# Patient Record
Sex: Female | Born: 1980
Health system: Southern US, Community
[De-identification: ages and names within clinical notes are randomized; demographics above are authoritative.]

## PROBLEM LIST (undated history)

## (undated) DIAGNOSIS — E538 Deficiency of other specified B group vitamins: Secondary | ICD-10-CM

## (undated) DIAGNOSIS — I10 Essential (primary) hypertension: Secondary | ICD-10-CM

## (undated) DIAGNOSIS — E559 Vitamin D deficiency, unspecified: Secondary | ICD-10-CM

## (undated) DIAGNOSIS — J45909 Unspecified asthma, uncomplicated: Secondary | ICD-10-CM

## (undated) DIAGNOSIS — E282 Polycystic ovarian syndrome: Secondary | ICD-10-CM

## (undated) DIAGNOSIS — Z6841 Body Mass Index (BMI) 40.0 and over, adult: Secondary | ICD-10-CM

## (undated) DIAGNOSIS — O99840 Bariatric surgery status complicating pregnancy, unspecified trimester: Secondary | ICD-10-CM

## (undated) DIAGNOSIS — D509 Iron deficiency anemia, unspecified: Secondary | ICD-10-CM

## (undated) HISTORY — DX: Iron deficiency anemia, unspecified: D50.9

## (undated) HISTORY — DX: Morbid (severe) obesity due to excess calories: E66.01

## (undated) HISTORY — PX: INTRAUTERINE DEVICE INSERTION: SHX323

## (undated) HISTORY — DX: Deficiency of other specified B group vitamins: E53.8

## (undated) HISTORY — DX: Vitamin D deficiency, unspecified: E55.9

## (undated) HISTORY — DX: Body Mass Index (BMI) 40.0 and over, adult: Z684

## (undated) HISTORY — DX: Polycystic ovarian syndrome: E28.2

## (undated) HISTORY — DX: Unspecified asthma, uncomplicated: J45.909

## (undated) HISTORY — PX: WISDOM TOOTH EXTRACTION: SHX21

## (undated) HISTORY — DX: Bariatric surgery status complicating pregnancy, unspecified trimester: O99.840

## (undated) HISTORY — DX: Essential (primary) hypertension: I10

---
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
## 1999-09-02 ENCOUNTER — Encounter: Admission: RE | Admit: 1999-09-02 | Discharge: 1999-09-02 | Payer: Self-pay | Admitting: Infectious Diseases

## 2003-09-30 HISTORY — PX: APPENDECTOMY: SHX54

## 2005-10-24 ENCOUNTER — Ambulatory Visit: Payer: Self-pay | Admitting: Internal Medicine

## 2006-09-06 ENCOUNTER — Ambulatory Visit: Payer: Self-pay | Admitting: Otolaryngology

## 2006-09-29 DIAGNOSIS — O99840 Bariatric surgery status complicating pregnancy, unspecified trimester: Secondary | ICD-10-CM

## 2006-09-29 HISTORY — DX: Bariatric surgery status complicating pregnancy, unspecified trimester: O99.840

## 2006-09-29 HISTORY — PX: GASTRIC BYPASS: SHX52

## 2006-10-04 ENCOUNTER — Ambulatory Visit: Payer: Self-pay | Admitting: Otolaryngology

## 2007-02-27 ENCOUNTER — Emergency Department (HOSPITAL_COMMUNITY): Admission: EM | Admit: 2007-02-27 | Discharge: 2007-02-27 | Payer: Self-pay | Admitting: Emergency Medicine

## 2007-05-08 ENCOUNTER — Ambulatory Visit: Payer: Self-pay | Admitting: Internal Medicine

## 2007-07-16 ENCOUNTER — Emergency Department: Payer: Self-pay | Admitting: Emergency Medicine

## 2007-09-18 ENCOUNTER — Ambulatory Visit: Payer: Self-pay

## 2007-09-20 ENCOUNTER — Ambulatory Visit: Payer: Self-pay

## 2007-09-22 ENCOUNTER — Ambulatory Visit: Payer: Self-pay

## 2008-02-13 ENCOUNTER — Inpatient Hospital Stay: Payer: Self-pay | Admitting: Specialist

## 2008-09-29 HISTORY — PX: OTHER SURGICAL HISTORY: SHX169

## 2009-04-16 ENCOUNTER — Ambulatory Visit (HOSPITAL_COMMUNITY): Admission: RE | Admit: 2009-04-16 | Discharge: 2009-04-16 | Payer: Self-pay | Admitting: Obstetrics

## 2009-05-25 ENCOUNTER — Inpatient Hospital Stay (HOSPITAL_COMMUNITY): Admission: AD | Admit: 2009-05-25 | Discharge: 2009-05-25 | Payer: Self-pay | Admitting: Obstetrics and Gynecology

## 2009-07-18 ENCOUNTER — Encounter: Admission: RE | Admit: 2009-07-18 | Discharge: 2009-09-26 | Payer: Self-pay | Admitting: Obstetrics

## 2010-01-07 ENCOUNTER — Inpatient Hospital Stay (HOSPITAL_COMMUNITY): Admission: AD | Admit: 2010-01-07 | Discharge: 2010-01-11 | Payer: Self-pay | Admitting: Obstetrics

## 2010-01-14 ENCOUNTER — Ambulatory Visit: Payer: Self-pay | Admitting: Pediatrics

## 2010-12-17 LAB — RH IMMUNE GLOB WKUP(>/=20WKS)(NOT WOMEN'S HOSP)

## 2010-12-18 LAB — CBC
HCT: 31.7 % — ABNORMAL LOW (ref 36.0–46.0)
HCT: 31.8 % — ABNORMAL LOW (ref 36.0–46.0)
Hemoglobin: 10.4 g/dL — ABNORMAL LOW (ref 12.0–15.0)
MCHC: 32.9 g/dL (ref 30.0–36.0)
MCHC: 33.9 g/dL (ref 30.0–36.0)
MCV: 84.6 fL (ref 78.0–100.0)
MCV: 84.7 fL (ref 78.0–100.0)
MCV: 84.8 fL (ref 78.0–100.0)
MCV: 84.8 fL (ref 78.0–100.0)
Platelets: 198 10*3/uL (ref 150–400)
Platelets: 231 10*3/uL (ref 150–400)
Platelets: 270 10*3/uL (ref 150–400)
RBC: 3.32 MIL/uL — ABNORMAL LOW (ref 3.87–5.11)
RDW: 13.2 % (ref 11.5–15.5)
RDW: 13.3 % (ref 11.5–15.5)
RDW: 13.6 % (ref 11.5–15.5)
WBC: 12.3 10*3/uL — ABNORMAL HIGH (ref 4.0–10.5)

## 2010-12-18 LAB — GLUCOSE, CAPILLARY
Glucose-Capillary: 79 mg/dL (ref 70–99)
Glucose-Capillary: 86 mg/dL (ref 70–99)
Glucose-Capillary: 91 mg/dL (ref 70–99)

## 2010-12-18 LAB — COMPREHENSIVE METABOLIC PANEL
ALT: 14 U/L (ref 0–35)
AST: 19 U/L (ref 0–37)
Albumin: 3.2 g/dL — ABNORMAL LOW (ref 3.5–5.2)
Calcium: 8.9 mg/dL (ref 8.4–10.5)
Chloride: 106 mEq/L (ref 96–112)
Creatinine, Ser: 0.53 mg/dL (ref 0.4–1.2)
GFR calc Af Amer: >60 mL/min (ref >60)
Sodium: 136 mEq/L (ref 135–145)
Total Bilirubin: 0.3 mg/dL (ref 0.3–1.2)

## 2010-12-18 LAB — TYPE AND SCREEN
ABO/RH(D): A NEG
Antibody Screen: NEGATIVE

## 2011-01-04 LAB — RH IMMUNE GLOBULIN WORKUP (NOT WOMEN'S HOSP): ABO/RH(D): A NEG

## 2011-02-26 LAB — GC/CHLAMYDIA PROBE AMP, GENITAL
Chlamydia: NEGATIVE
Gonorrhea: NEGATIVE

## 2011-03-07 ENCOUNTER — Ambulatory Visit: Payer: Self-pay | Admitting: Family Medicine

## 2011-05-11 LAB — ANTIBODY SCREEN: Antibody Screen: NEGATIVE

## 2011-05-11 LAB — HEPATITIS B SURFACE ANTIGEN: Hepatitis B Surface Ag: NEGATIVE

## 2011-05-11 LAB — RPR: RPR: NONREACTIVE

## 2011-05-11 LAB — RUBELLA ANTIBODY, IGM: Rubella: IMMUNE

## 2011-09-30 NOTE — L&D Delivery Note (Signed)
Delivery Note At 10:58 PM a viable female was delivered via Vaginal, Spontaneous Delivery (Presentation: Right Occiput Anterior).  APGAR: 10, 10; weight .  8'1 Placenta status: Intact, Spontaneous.  Cord: 3 vessels with the following complications: None.    Anesthesia: Epidural  Episiotomy: None Lacerations: 2nd degree perineal w/ extension to L labia majora Suture Repair: 2.0 3.0 vicryl rapide, 2-0 for repair of laceration, 3-0 to reapproximate subcuticular layer of labial extension Est. Blood Loss (mL): 500cc  Mom to postpartum.  Baby to nursery-stable.  Meredith Castillo A. 11/18/2011, 11:29 PM

## 2011-10-13 ENCOUNTER — Inpatient Hospital Stay (HOSPITAL_COMMUNITY)
Admission: AD | Admit: 2011-10-13 | Discharge: 2011-10-13 | Disposition: A | Discharge status: Home / Self Care | Payer: BC Managed Care – PPO | Source: Ambulatory Visit | Attending: Obstetrics and Gynecology | Admitting: Obstetrics and Gynecology

## 2011-10-13 ENCOUNTER — Encounter (HOSPITAL_COMMUNITY): Payer: Self-pay

## 2011-10-13 DIAGNOSIS — O99019 Anemia complicating pregnancy, unspecified trimester: Secondary | ICD-10-CM | POA: Insufficient documentation

## 2011-10-13 DIAGNOSIS — D509 Iron deficiency anemia, unspecified: Secondary | ICD-10-CM | POA: Insufficient documentation

## 2011-10-13 MED ORDER — SODIUM CHLORIDE 0.9 % IV SOLN
Freq: Once | INTRAVENOUS | Status: AC
  Administered 2011-10-13: 16:00:00 via INTRAVENOUS

## 2011-10-13 MED ORDER — FERUMOXYTOL INJECTION 510 MG/17 ML
510.0000 mg | Freq: Once | INTRAVENOUS | Status: AC
  Administered 2011-10-13: 510 mg via INTRAVENOUS
  Filled 2011-10-13: qty 17

## 2011-10-13 NOTE — Progress Notes (Signed)
Pt here for iv iron infusion only, states hbg level 8.4, ferritin 3. Denies pain or bleeding. +FM.

## 2011-10-16 ENCOUNTER — Other Ambulatory Visit: Payer: Self-pay | Admitting: Obstetrics

## 2011-10-16 MED ORDER — FERUMOXYTOL INJECTION 510 MG/17 ML: 510.0000 mg | Freq: Once | INTRAVENOUS | Status: DC

## 2011-10-16 MED ORDER — LACTATED RINGERS IV SOLN: 1000.0000 mL | INTRAVENOUS | Status: DC

## 2011-10-17 ENCOUNTER — Inpatient Hospital Stay (HOSPITAL_COMMUNITY)
Admission: AD | Admit: 2011-10-17 | Discharge: 2011-10-17 | Disposition: A | Discharge status: Home / Self Care | Payer: BC Managed Care – PPO | Source: Ambulatory Visit | Attending: Obstetrics & Gynecology | Admitting: Obstetrics & Gynecology

## 2011-10-17 ENCOUNTER — Encounter (HOSPITAL_COMMUNITY): Payer: Self-pay | Admitting: *Deleted

## 2011-10-17 DIAGNOSIS — O99840 Bariatric surgery status complicating pregnancy, unspecified trimester: Secondary | ICD-10-CM | POA: Insufficient documentation

## 2011-10-17 DIAGNOSIS — O99019 Anemia complicating pregnancy, unspecified trimester: Secondary | ICD-10-CM | POA: Insufficient documentation

## 2011-10-17 DIAGNOSIS — D649 Anemia, unspecified: Secondary | ICD-10-CM | POA: Insufficient documentation

## 2011-10-17 MED ORDER — SODIUM CHLORIDE 0.9 % IV SOLN
INTRAVENOUS | Status: DC
  Administered 2011-10-17: 13:00:00 via INTRAVENOUS

## 2011-10-17 MED ORDER — FERUMOXYTOL INJECTION 510 MG/17 ML
510.0000 mg | Freq: Once | INTRAVENOUS | Status: AC
  Administered 2011-10-17: 510 mg via INTRAVENOUS
  Filled 2011-10-17: qty 17

## 2011-10-22 LAB — STREP B DNA PROBE: GBS: NEGATIVE

## 2011-11-14 ENCOUNTER — Telehealth (HOSPITAL_COMMUNITY): Payer: Self-pay | Admitting: *Deleted

## 2011-11-14 ENCOUNTER — Encounter (HOSPITAL_COMMUNITY): Payer: Self-pay | Admitting: *Deleted

## 2011-11-14 NOTE — Telephone Encounter (Signed)
Preadmission screen  

## 2011-11-17 ENCOUNTER — Other Ambulatory Visit: Payer: Self-pay | Admitting: Obstetrics

## 2011-11-18 ENCOUNTER — Encounter (HOSPITAL_COMMUNITY): Payer: Self-pay | Admitting: Anesthesiology

## 2011-11-18 ENCOUNTER — Encounter (HOSPITAL_COMMUNITY): Payer: Self-pay

## 2011-11-18 ENCOUNTER — Inpatient Hospital Stay (HOSPITAL_COMMUNITY)
Admission: RE | Admit: 2011-11-18 | Discharge: 2011-11-20 | DRG: 372 | Disposition: A | Discharge status: Home Health | Payer: BC Managed Care – PPO | Source: Ambulatory Visit | Attending: Obstetrics | Admitting: Obstetrics

## 2011-11-18 ENCOUNTER — Inpatient Hospital Stay (HOSPITAL_COMMUNITY): Payer: BC Managed Care – PPO | Admitting: Anesthesiology

## 2011-11-18 DIAGNOSIS — O99844 Bariatric surgery status complicating childbirth: Secondary | ICD-10-CM | POA: Diagnosis present

## 2011-11-18 DIAGNOSIS — D509 Iron deficiency anemia, unspecified: Secondary | ICD-10-CM | POA: Diagnosis present

## 2011-11-18 DIAGNOSIS — O9902 Anemia complicating childbirth: Secondary | ICD-10-CM | POA: Diagnosis present

## 2011-11-18 DIAGNOSIS — D649 Anemia, unspecified: Secondary | ICD-10-CM | POA: Diagnosis present

## 2011-11-18 DIAGNOSIS — O99814 Abnormal glucose complicating childbirth: Secondary | ICD-10-CM | POA: Diagnosis present

## 2011-11-18 LAB — CBC
HCT: 34.1 % — ABNORMAL LOW (ref 36.0–46.0)
Hemoglobin: 10.5 g/dL — ABNORMAL LOW (ref 12.0–15.0)
Platelets: 202 10*3/uL (ref 150–400)
WBC: 9.2 10*3/uL (ref 4.0–10.5)

## 2011-11-18 LAB — TYPE AND SCREEN: Antibody Screen: POSITIVE

## 2011-11-18 LAB — GLUCOSE, CAPILLARY
Glucose-Capillary: 64 mg/dL — ABNORMAL LOW (ref 70–99)
Glucose-Capillary: 78 mg/dL (ref 70–99)

## 2011-11-18 LAB — GLUCOSE, RANDOM: Glucose, Bld: 76 mg/dL (ref 70–99)

## 2011-11-18 MED ORDER — DIPHENHYDRAMINE HCL 50 MG/ML IJ SOLN
12.5000 mg | INTRAMUSCULAR | Status: AC | PRN
  Administered 2011-11-18 (×3): 12.5 mg via INTRAVENOUS
  Filled 2011-11-18 (×2): qty 1

## 2011-11-18 MED ORDER — LACTATED RINGERS IV SOLN: 500.0000 mL | INTRAVENOUS | Status: DC | PRN

## 2011-11-18 MED ORDER — TERBUTALINE SULFATE 1 MG/ML IJ SOLN: 0.2500 mg | Freq: Once | INTRAMUSCULAR | Status: AC | PRN

## 2011-11-18 MED ORDER — ONDANSETRON HCL 4 MG/2ML IJ SOLN: 4.0000 mg | Freq: Four times a day (QID) | INTRAMUSCULAR | Status: DC | PRN

## 2011-11-18 MED ORDER — PHENYLEPHRINE 40 MCG/ML (10ML) SYRINGE FOR IV PUSH (FOR BLOOD PRESSURE SUPPORT): 80.0000 ug | PREFILLED_SYRINGE | INTRAVENOUS | Status: DC | PRN

## 2011-11-18 MED ORDER — FENTANYL 2.5 MCG/ML BUPIVACAINE 1/10 % EPIDURAL INFUSION (WH - ANES)
INTRAMUSCULAR | Status: DC | PRN
  Administered 2011-11-18: 14 mL/h via EPIDURAL

## 2011-11-18 MED ORDER — IBUPROFEN 600 MG PO TABS: 600.0000 mg | ORAL_TABLET | Freq: Four times a day (QID) | ORAL | Status: DC | PRN

## 2011-11-18 MED ORDER — CITRIC ACID-SODIUM CITRATE 334-500 MG/5ML PO SOLN: 30.0000 mL | ORAL | Status: DC | PRN

## 2011-11-18 MED ORDER — SODIUM BICARBONATE 8.4 % IV SOLN
INTRAVENOUS | Status: DC | PRN
  Administered 2011-11-18: 4 mL via EPIDURAL

## 2011-11-18 MED ORDER — LIDOCAINE HCL (PF) 1 % IJ SOLN
30.0000 mL | INTRAMUSCULAR | Status: DC | PRN
  Filled 2011-11-18: qty 30

## 2011-11-18 MED ORDER — PHENYLEPHRINE 40 MCG/ML (10ML) SYRINGE FOR IV PUSH (FOR BLOOD PRESSURE SUPPORT)
80.0000 ug | PREFILLED_SYRINGE | INTRAVENOUS | Status: DC | PRN
  Filled 2011-11-18: qty 5

## 2011-11-18 MED ORDER — FENTANYL 2.5 MCG/ML BUPIVACAINE 1/10 % EPIDURAL INFUSION (WH - ANES)
14.0000 mL/h | INTRAMUSCULAR | Status: DC
  Administered 2011-11-18 (×3): 14 mL/h via EPIDURAL
  Filled 2011-11-18 (×4): qty 60

## 2011-11-18 MED ORDER — LACTATED RINGERS IV SOLN
INTRAVENOUS | Status: DC
  Administered 2011-11-18 (×5): via INTRAVENOUS

## 2011-11-18 MED ORDER — OXYTOCIN BOLUS FROM INFUSION
500.0000 mL | Freq: Once | INTRAVENOUS | Status: DC
  Filled 2011-11-18: qty 500

## 2011-11-18 MED ORDER — LACTATED RINGERS IV SOLN: 500.0000 mL | Freq: Once | INTRAVENOUS | Status: DC

## 2011-11-18 MED ORDER — ACETAMINOPHEN 325 MG PO TABS: 650.0000 mg | ORAL_TABLET | ORAL | Status: DC | PRN

## 2011-11-18 MED ORDER — FLEET ENEMA 7-19 GM/118ML RE ENEM: 1.0000 | ENEMA | RECTAL | Status: DC | PRN

## 2011-11-18 MED ORDER — EPHEDRINE 5 MG/ML INJ: 10.0000 mg | INTRAVENOUS | Status: DC | PRN

## 2011-11-18 MED ORDER — ZOLPIDEM TARTRATE 10 MG PO TABS: 10.0000 mg | ORAL_TABLET | Freq: Every evening | ORAL | Status: DC | PRN

## 2011-11-18 MED ORDER — OXYTOCIN 20 UNITS IN LACTATED RINGERS INFUSION - SIMPLE
1.0000 m[IU]/min | INTRAVENOUS | Status: DC
  Administered 2011-11-18: 2 m[IU]/min via INTRAVENOUS
  Filled 2011-11-18 (×2): qty 1000

## 2011-11-18 MED ORDER — OXYTOCIN 20 UNITS IN LACTATED RINGERS INFUSION - SIMPLE
125.0000 mL/h | Freq: Once | INTRAVENOUS | Status: AC
  Administered 2011-11-18: 125 mL/h via INTRAVENOUS

## 2011-11-18 MED ORDER — OXYCODONE-ACETAMINOPHEN 5-325 MG PO TABS: 1.0000 | ORAL_TABLET | ORAL | Status: DC | PRN

## 2011-11-18 MED ORDER — EPHEDRINE 5 MG/ML INJ
10.0000 mg | INTRAVENOUS | Status: DC | PRN
  Filled 2011-11-18: qty 4

## 2011-11-18 MED ORDER — BUPIVACAINE HCL (PF) 0.25 % IJ SOLN
INTRAMUSCULAR | Status: DC | PRN
  Administered 2011-11-18 (×2): 5 mL

## 2011-11-18 NOTE — Progress Notes (Signed)
Dr. Ernestina Penna in, SVE, patient complete but in a lot of pain. OK to get anesthesia bolus and resolve pain, patient to labor down. Candise Che, RN

## 2011-11-18 NOTE — Progress Notes (Signed)
S: Doing well, no complaints, pain well controlled with epidural  O: BP 106/57  Pulse 70  Temp(Src) 98.5 F (36.9 C) (Oral)  Resp 20  Ht 5\' 8"  (1.727 m)  Wt 123.832 kg (273 lb)  BMI 41.51 kg/m2  SpO2 99%  LMP 02/07/2011   FHT:  FHR: 140 bpm, variability: moderate,  accelerations:  Present,  decelerations:  Absent UC:   regular, every 3 minutes, pit at 14 munits/ min, MVU 170-210 SVE:   Dilation: 4 Effacement (%): 80 Station: -1 Exam by:: Raliegh Ip RN Repeat exam now by me, no change over 4 hrs   A / P:  31 y.o.  Obstetric History   G2   P1   T1   P0   A0   TAB0   SAB0   E0   M0   L1    at [redacted]w[redacted]d Induction of labor due to elective and anemia,  progressing well on pitocin  Fetal Wellbeing:  Category I Pain Control:  Epidural  Anticipated MOD:  NSVD, concern given as EFW larger than prior pregnancy and slow change, unclear if pt entered active phase (uncomfortable to needing epidural) or still latent. Mom and baby well, cont to increase pitocin.   Deuce Paternoster A. 11/18/2011, 5:32 PM

## 2011-11-18 NOTE — Progress Notes (Signed)
S: Doing well, no complaints, pain well controlled with epidural  O: BP 118/72  Pulse 69  Temp(Src) 98.6 F (37 C) (Oral)  Resp 20  Ht 5\' 8"  (1.727 m)  Wt 123.832 kg (273 lb)  BMI 41.51 kg/m2  SpO2 99%  LMP 02/07/2011   FHT:  FHR: 120s bpm, variability: moderate,  accelerations:  Present,  decelerations:  Absent UC:   regular, every 4 minutes SVE:   Dilation: 2.5 Effacement (%): 80 Station: -1 Exam by:: Wiliam Ke CNM, repeated now by [facility], no signif change in cvx. IUPC placed   A / P:  31 y.o.  Obstetric History   G2   P1   T1   P0   A0   TAB0   SAB0   E0   M0   L1    at [redacted]w[redacted]d, elective IOL, anemia Induction of labor due to elective,  progressing well on pitocin, slow change, likely need to titrate pitocin, IUPC placed  Fetal Wellbeing:  Category I Pain Control:  Epidural  Anticipated MOD:  NSVD  Meredith Castillo A. 11/18/2011, 1:03 PM

## 2011-11-18 NOTE — Progress Notes (Signed)
Patient ID: Meredith Castillo, female   DOB: August 11, 1981, 31 y.o.   MRN: [id number]  Sent by MD to recheck cervix and augment labor with AROM  S: Feeling ctx / planning epidural     Tolerating contractions well - resting on side / family at bedside      States "hates cervix check" - " I always scream with any exam"                         agrees to recheck with AROM but prefers all other exams after epidural  O:  VS: Blood pressure 139/85, pulse 66, temperature 98.4 F (36.9 C), temperature source Oral, resp. rate 20, height 5\' 8"  (1.727 m), weight 123.832 kg (273 lb), last menstrual period 02/07/2011.        FHR : baseline 135 / variability moderate / accels + / decels none        Toco: contractions every 3-4 minutes / pitocin at 8 mu/min        Cervix : mid-position / 2cm / 80% / vtx / -1  (easy cervical exam - patient tense & tearful thru exam)        Membranes: AROM  A: induction of labor - iron deficiency anemia / elective     FHR category 1  P: continue pitocin induction of labor      Early epidural - patient intolerant of cervical examinations / prefers epidural before further exams      Dr Ernestina Penna updated - MD to manage in labor     Marjo Grosvenor 11/18/2011, 11:07 AM

## 2011-11-18 NOTE — Progress Notes (Signed)
S: Doing well, no complaints, pain has been controlled with epidural though starting to feel increased bladder pressure, felt catheter manipulation and feeling increasing discomfort with pelvic exam  O: BP 121/62  Pulse 68  Temp(Src) 98.8 F (37.1 C) (Oral)  Resp 18  Ht 5\' 8"  (1.727 m)  Wt 123.832 kg (273 lb)  BMI 41.51 kg/m2  SpO2 99%  LMP 02/07/2011   FHT:  FHR: 120s bpm, variability: moderate,  accelerations:  Present,  decelerations:  Absent UC:   regular, every 3 minutes, pit at 16 munits/ min. MVU around 200 SVE:   Dilation: 4 Effacement (%): 80 Station: -1 Exam by:: Dr. Ernestina Penna Repeat exam now 5/80%/-1   A / P:  31 y.o.  Obstetric History   G2   P1   T1   P0   A0   TAB0   SAB0   E0   M0   L1    at [redacted]w[redacted]d IOL elective/ anemia Slow progress, now entering active phase, cont to monitor for arrest. Pt w prior SVD, mom and baby well, cont expectant management, increase pitocin. GDM. Metformin, BS well controlled.  Fetal Wellbeing:  Category I Pain Control:  Epidural, will give bolus now  Anticipated MOD:  NSVD  Meredith Castillo A. 11/18/2011, 7:42 PM

## 2011-11-18 NOTE — Anesthesia Preprocedure Evaluation (Signed)
Anesthesia Evaluation  Patient identified by MRN, date of birth, ID band Patient awake    Reviewed: Allergy & Precautions, H&P , Patient's Chart, lab work & pertinent test results  Airway Mallampati: II TM Distance: >3 FB Neck ROM: full    Dental  (+) Teeth Intact   Pulmonary asthma (Rare inhaler use; mostly seasonal) ,  clear to auscultation        Cardiovascular regular Normal    Neuro/Psych    GI/Hepatic   Endo/Other  Morbid obesity  Renal/GU      Musculoskeletal   Abdominal   Peds  Hematology   Anesthesia Other Findings       Reproductive/Obstetrics (+) Pregnancy                           Anesthesia Physical Anesthesia Plan  ASA: III  Anesthesia Plan: Epidural   Post-op Pain Management:    Induction:   Airway Management Planned:   Additional Equipment:   Intra-op Plan:   Post-operative Plan:   Informed Consent: I have reviewed the patients History and Physical, chart, labs and discussed the procedure including the risks, benefits and alternatives for the proposed anesthesia with the patient or authorized representative who has indicated his/her understanding and acceptance.   Dental Advisory Given  Plan Discussed with:   Anesthesia Plan Comments: (Labs checked- platelets confirmed with RN in room. Fetal heart tracing, per RN, reported to be stable enough for sitting procedure. Discussed epidural, and patient consents to the procedure:  included risk of possible headache,backache, failed block, allergic reaction, and nerve injury. This patient was asked if she had any questions or concerns before the procedure started. )        Anesthesia Quick Evaluation

## 2011-11-18 NOTE — H&P (Signed)
Meredith Castillo is a 31 y.o. G2P1001 at [redacted]w[redacted]d presenting for induction of labor for reasons of maternal anemia and elective. Meredith Castillo legally blind and pt and Meredith Castillo concerned about transportation issues and getting care for other child in the event of labor. Pt also w/ severe iron deficient anemia for which she has needed IV iron transfusions. Pt notes occ contractions . Good fetal movement, No vaginal bleeding, not leaking fluid.  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 Anemia   . Bariatric surgery status complicating pregnancy, childbirth, or the puerperium, antepartum condition or complication   . Acute maxillary sinusitis   . Unspecified vitamin D deficiency   . Polycystic ovaries   . Obese   . Hypertension    Past Surgical History  Procedure Date  . Gastric bypass 2008  . Appendectomy 2005  . Wisdom tooth extraction   . Nasal septum surgery 1996  . Gynecologic surgery 2010    HS[facility]   Family History: family history includes Cancer in her maternal grandfather; Diabetes in her mother; Heart attack in her paternal grandfather; Heart disease in her mother; and Hypertension in her mother.  There is no history of Anesthesia problems. Social History:  reports that she quit smoking about 6 years ago. She has never used smokeless tobacco. She  reports that she does not drink alcohol or use illicit drugs.  Review of Systems - Negative except Braxton Hix ctx     Last menstrual period 02/07/2011.  Physical Exam:  Gen: well appearing, no distress CV: RRR Pulm: CTAB Back: no CVAT Abd: gravid, NT, no RUQ pain, obese LE: tr edema, equal bilaterally, non-tender Toco: rare ctx, no pitocin yet FH: baseline 130s, accelerations present, no deceleratons, 10 beat variability Cvx: 2/50%/ vtx -2  CBC    Component Value Date/Time   WBC 13.8* 01/10/2010 0530   RBC 3.32* 01/10/2010 0530   HGB 9.5* 01/10/2010 0530   HCT 28.1* 01/10/2010 0530   PLT 198 01/10/2010 0530   MCV 84.6 01/10/2010 0530   MCHC 33.9 01/10/2010 0530   RDW 13.6 01/10/2010 0530      Prenatal labs: ABO, Rh: A/Negative/-- (08/12 0000) Antibody: Negative (08/12 0000) Rubella:  immune RPR: Nonreactive (08/12 0000)  HBsAg: Negative (08/12 0000)  HIV: Non-reactive (08/12 0000)  GBS: Negative (01/23 0000)  1 hr Glucola normal  Genetic screening nl NT, nl AFP Anatomy US normal   Assessment/Plan: 31 y.o. G2P1001 at [redacted]w[redacted]d - elective IOL at term. Plan pit, arom when active - GBS neg - Reactive fetal testing - h/o anemia, follow closely   Meredith Castillo A. 11/18/2011, 7:34 AM

## 2011-11-18 NOTE — Anesthesia Procedure Notes (Signed)
Epidural Patient location during procedure: OB  Preanesthetic Checklist Completed: patient identified, site marked, surgical consent, pre-op evaluation, timeout performed, IV checked, risks and benefits discussed and monitors and equipment checked  Epidural Patient position: sitting Prep: site prepped and draped and DuraPrep Patient monitoring: continuous pulse ox and blood pressure Approach: midline Injection technique: LOR air  Needle:  Needle type: Tuohy  Needle gauge: 17 G Needle length: 9 cm Needle insertion depth: 9 cm Catheter type: closed end flexible Catheter size: 19 Gauge Test dose: negative  Assessment Events: blood not aspirated, injection not painful, no injection resistance, negative IV test and no paresthesia  Additional Notes Dosing of Epidural:  1st dose, through needle ............................................. epi 1:200K + Xylocaine 40 mg  2nd dose, through catheter, after waiting 3 minutes.....epi 1:200K + Xylocaine 40 mg  3rd dose, through catheter after waiting 3 minutes .............................Marcaine   4mg   ( mg Marcaine are expressed as equivilent  cc's medication removed from the 0.1%Bupiv / fentanyl syringe from L&D pump)  ( 2% Xylo charted as a single dose in Epic Meds for ease of charting; actual dosing was fractionated as above, for saftey's sake)  As each dose occurred, patient was free of IV sx; and patient exhibited no evidence of SA injection.  Patient is more comfortable after epidural dosed. Please see RN's note for documentation of vital signs,and FHR which are stable.    

## 2011-11-19 ENCOUNTER — Encounter (HOSPITAL_COMMUNITY): Payer: Self-pay

## 2011-11-19 LAB — CBC
HCT: 31.4 % — ABNORMAL LOW (ref 36.0–46.0)
MCH: 24.2 pg — ABNORMAL LOW (ref 26.0–34.0)
MCHC: 30.9 g/dL (ref 30.0–36.0)
MCV: 78.3 fL (ref 78.0–100.0)
Platelets: 202 10*3/uL (ref 150–400)

## 2011-11-19 MED ORDER — FLEET ENEMA 7-19 GM/118ML RE ENEM: 1.0000 | ENEMA | Freq: Every day | RECTAL | Status: DC | PRN

## 2011-11-19 MED ORDER — FERROUS SULFATE 325 (65 FE) MG PO TABS
325.0000 mg | ORAL_TABLET | Freq: Two times a day (BID) | ORAL | Status: DC
  Administered 2011-11-19 – 2011-11-20 (×3): 325 mg via ORAL
  Filled 2011-11-19 (×3): qty 1

## 2011-11-19 MED ORDER — ONDANSETRON HCL 4 MG PO TABS: 4.0000 mg | ORAL_TABLET | ORAL | Status: DC | PRN

## 2011-11-19 MED ORDER — CALCIUM CITRATE 950 (200 CA) MG PO TABS: 1.0000 | ORAL_TABLET | Freq: Every day | ORAL | Status: DC

## 2011-11-19 MED ORDER — PRENATAL MULTIVITAMIN CH
1.0000 | ORAL_TABLET | Freq: Every day | ORAL | Status: DC
  Administered 2011-11-19 – 2011-11-20 (×2): 1 via ORAL
  Filled 2011-11-19 (×2): qty 1

## 2011-11-19 MED ORDER — TETANUS-DIPHTH-ACELL PERTUSSIS 5-2.5-18.5 LF-MCG/0.5 IM SUSP: 0.5000 mL | Freq: Once | INTRAMUSCULAR | Status: DC

## 2011-11-19 MED ORDER — IBUPROFEN 600 MG PO TABS
600.0000 mg | ORAL_TABLET | Freq: Four times a day (QID) | ORAL | Status: DC
  Administered 2011-11-19 – 2011-11-20 (×7): 600 mg via ORAL
  Filled 2011-11-19 (×7): qty 1

## 2011-11-19 MED ORDER — METFORMIN HCL ER 500 MG PO TB24
1000.0000 mg | ORAL_TABLET | Freq: Every day | ORAL | Status: DC
  Administered 2011-11-19: 1000 mg via ORAL
  Filled 2011-11-19 (×2): qty 2

## 2011-11-19 MED ORDER — OXYCODONE-ACETAMINOPHEN 5-325 MG PO TABS
1.0000 | ORAL_TABLET | ORAL | Status: DC | PRN
  Administered 2011-11-19 – 2011-11-20 (×9): 1 via ORAL
  Filled 2011-11-19 (×9): qty 1

## 2011-11-19 MED ORDER — ONDANSETRON HCL 4 MG/2ML IJ SOLN: 4.0000 mg | INTRAMUSCULAR | Status: DC | PRN

## 2011-11-19 MED ORDER — LANOLIN HYDROUS EX OINT: TOPICAL_OINTMENT | CUTANEOUS | Status: DC | PRN

## 2011-11-19 MED ORDER — SIMETHICONE 80 MG PO CHEW: 80.0000 mg | CHEWABLE_TABLET | ORAL | Status: DC | PRN

## 2011-11-19 MED ORDER — DIPHENHYDRAMINE HCL 25 MG PO CAPS: 25.0000 mg | ORAL_CAPSULE | Freq: Four times a day (QID) | ORAL | Status: DC | PRN

## 2011-11-19 MED ORDER — CALCIUM CARBONATE ANTACID 500 MG PO CHEW: 1.0000 | CHEWABLE_TABLET | Freq: Every day | ORAL | Status: DC

## 2011-11-19 MED ORDER — BENZOCAINE-MENTHOL 20-0.5 % EX AERO
1.0000 "application " | INHALATION_SPRAY | CUTANEOUS | Status: DC | PRN
  Administered 2011-11-19 – 2011-11-20 (×2): 1 via TOPICAL

## 2011-11-19 MED ORDER — DIBUCAINE 1 % RE OINT: 1.0000 "application " | TOPICAL_OINTMENT | RECTAL | Status: DC | PRN

## 2011-11-19 MED ORDER — BENZOCAINE-MENTHOL 20-0.5 % EX AERO
INHALATION_SPRAY | CUTANEOUS | Status: AC
  Filled 2011-11-19: qty 56

## 2011-11-19 MED ORDER — BISACODYL 10 MG RE SUPP: 10.0000 mg | Freq: Every day | RECTAL | Status: DC | PRN

## 2011-11-19 MED ORDER — ZOLPIDEM TARTRATE 5 MG PO TABS: 5.0000 mg | ORAL_TABLET | Freq: Every evening | ORAL | Status: DC | PRN

## 2011-11-19 MED ORDER — SENNOSIDES-DOCUSATE SODIUM 8.6-50 MG PO TABS
2.0000 | ORAL_TABLET | Freq: Every day | ORAL | Status: DC
  Administered 2011-11-19: 2 via ORAL

## 2011-11-19 NOTE — Anesthesia Postprocedure Evaluation (Signed)
  Anesthesia Post-op Note  Patient: Meredith Castillo  Procedure(s) Performed: * No procedures listed *  Patient Location: Mother/Baby  Anesthesia Type: Epidural  Level of Consciousness: awake, alert  and oriented  Airway and Oxygen Therapy: Patient Spontanous Breathing  Post-op Pain: none  Post-op Assessment: Patient's Cardiovascular Status Stable and Respiratory Function Stable  Post-op Vital Signs: stable  Complications: No apparent anesthesia complications

## 2011-11-19 NOTE — Progress Notes (Addendum)
PPD 1 SVD  S:  Reports feeling well, (+) cramping w/ BFing             Tolerating po/ No nausea or vomiting             Bleeding is moderate, 1 small clot this am.             Pain controlled with Motrin, occasional Percocet             Up ad lib / ambulatory  Newborn  Information for the patient's newborn:  Dillard Cannon Girl Sarajean [[id number]]  female  breast feeding / denies concerns   O:  A & O x 3 NAD             VS: Blood pressure 123/85, pulse 73, temperature 98.1 F (36.7 C), temperature source Oral, resp. rate 18, height 5\' 8"  (1.727 m), weight 123.832 kg (273 lb), last menstrual period 02/07/2011, SpO2 99.00%, unknown if currently breastfeeding.  LABS:  Basename 11/19/11 0537 11/18/11 0800  WBC 15.7* 9.2  HGB 9.7* 10.5*  HCT 31.4* 34.1*  PLT 202 202    I&O: I/O last 3 completed shifts: In: -  Out: 1300 [Urine:1300]      Lungs: Clear and unlabored  Heart: regular rate and rhythm / no mumurs  Abdomen: soft, non-tender, non-distended , obese             Fundus: firm, non-tender, @U   Perineum: repair intact, no edema  Lochia: small  Extremities: no edema, no calf pain or tenderness, neg Homans    A/P: PPD # 1 31 y.o., [id number]    Principal Problem:  *PP care - s/p SVD 2/19 Active Problems:  Anemia  Asymptomatic, continue oral iron suppl.  Rh negative status - infant screen Rh neg, no indication for prophylaxis   Doing well - stable status  Routine post partum orders  Anticipate discharge home in AM.   Arlan Organ, CNM, MSN 11/19/2011, 9:28 AM

## 2011-11-20 LAB — GLUCOSE, CAPILLARY: Glucose-Capillary: 65 mg/dL — ABNORMAL LOW (ref 70–99)

## 2011-11-20 MED ORDER — HYDROCORTISONE 2.5 % RE CREA: TOPICAL_CREAM | Freq: Two times a day (BID) | RECTAL | Status: AC

## 2011-11-20 MED ORDER — BENZOCAINE-MENTHOL 20-0.5 % EX AERO
INHALATION_SPRAY | CUTANEOUS | Status: AC
  Filled 2011-11-20: qty 56

## 2011-11-20 MED ORDER — DOCUSATE SODIUM 100 MG PO CAPS: 100.0000 mg | ORAL_CAPSULE | Freq: Two times a day (BID) | ORAL | Status: AC

## 2011-11-20 MED ORDER — OXYCODONE-ACETAMINOPHEN 5-325 MG PO TABS: 1.0000 | ORAL_TABLET | ORAL | Status: AC | PRN

## 2011-11-20 MED ORDER — IBUPROFEN 600 MG PO TABS: 600.0000 mg | ORAL_TABLET | Freq: Four times a day (QID) | ORAL | Status: AC

## 2011-11-20 MED ORDER — NIFEREX-150 150-50-50 MG PO CAPS: 150.0000 mg | ORAL_CAPSULE | Freq: Every day | ORAL | Status: DC

## 2011-11-20 NOTE — Progress Notes (Signed)
Patient ID: Meredith Castillo, female   DOB: 29-Apr-1981, 31 y.o.   MRN: [id number]  PPD 2 SVD  S:  Reports feeling pain at stitches  / feels really swollen due to lots of stitches             Tolerating po/ No nausea or vomiting             Bleeding is light             Pain controlled with motrin and percocet             Up ad lib / ambulatory / voiding QS / + BM - states pain with BM due to hard stools  Newborn breast feeding  / female / some nipple pain with feedings - talking with lactation   O:  A & O x 3 NAD             VS: Blood pressure 118/80, pulse 80, temperature 98.3 F (36.8 C), temperature source Oral, resp. rate 20, height 5\' 8"  (1.727 m), weight 123.832 kg (273 lb), last menstrual period 02/07/2011, SpO2 99.00%, unknown if currently breastfeeding.  Lungs: Clear and unlabored  Heart: regular rate and rhythm   Abdomen: soft, non-tender, non-distended              Fundus: firm, non-tender, Ueven  Perineum: mild edema  Lochia: light  Extremities: trace edema, no calf pain or tenderness    A: PPD # 2              Iron deficiency anemia - stable  Doing well - stable status  P:  Routine post partum orders  Discharge to home  [facility] 11/20/2011, 10:17 AM

## 2011-11-20 NOTE — Discharge Summary (Signed)
Obstetric Discharge Summary Reason for Admission: induction of labor Prenatal Procedures: NST, ultrasound and Iv iron therapy Intrapartum Procedures: spontaneous vaginal delivery Postpartum Procedures: none Complications-Operative and Postpartum: 1st  degree perineal laceration Hemoglobin  Date Value Range Status  11/19/2011 9.7* 12.0-15.0 (g/dL) Final     HCT  Date Value Range Status  11/19/2011 31.4* 36.0-46.0 (%) Final    Discharge Diagnoses: Term Pregnancy-delivered and PCOS and iron deficiency anemia and obesity  Discharge Information: Date: 11/20/2011 Activity: pelvic rest Diet: carb modified Medications: PNV, Ibuprofen, Colace, Iron, Percocet and metformin Condition: stable Instructions: refer to practice specific booklet Discharge to: home Follow-up Information    Follow up with [facility] MD. Schedule an appointment as soon as possible for a visit in 6 weeks.   Contact information:   [address] [place] [address] [phone]          Newborn Data: Live born female  Birth Weight: 8 lb 1.5 oz (3670 g) APGAR: 10, 10  Home with mother.  Meredith Castillo 11/20/2011, 10:26 AM

## 2011-12-05 NOTE — Clinical Documentation Improvement (Signed)
3rd trimester with h/o bariatric surgery RP:  Severe anemia for IV Iron transfusion  Genia Del  12/05/2011

## 2011-12-08 ENCOUNTER — Telehealth: Payer: Self-pay | Admitting: Oncology

## 2011-12-08 NOTE — Telephone Encounter (Signed)
lmonvm advising the pt of her new pt appt with dr Gaylyn Rong in April. lmonvm for the pt to call me back

## 2011-12-09 ENCOUNTER — Telehealth: Payer: Self-pay | Admitting: Oncology

## 2011-12-09 NOTE — Telephone Encounter (Signed)
Pt called back to confirm her appts on 12/31/2011 with dr Gaylyn Rong

## 2011-12-16 ENCOUNTER — Telehealth: Payer: Self-pay | Admitting: Oncology

## 2011-12-16 NOTE — Telephone Encounter (Signed)
Referred by Dr. Noland Fordyce Dx- IDA, Chronic

## 2011-12-31 ENCOUNTER — Ambulatory Visit: Payer: BC Managed Care – PPO

## 2011-12-31 ENCOUNTER — Encounter: Payer: Self-pay | Admitting: Oncology

## 2011-12-31 ENCOUNTER — Ambulatory Visit (HOSPITAL_BASED_OUTPATIENT_CLINIC_OR_DEPARTMENT_OTHER): Payer: BC Managed Care – PPO | Admitting: Oncology

## 2011-12-31 ENCOUNTER — Telehealth: Payer: Self-pay | Admitting: Oncology

## 2011-12-31 ENCOUNTER — Other Ambulatory Visit (HOSPITAL_BASED_OUTPATIENT_CLINIC_OR_DEPARTMENT_OTHER): Payer: BC Managed Care – PPO

## 2011-12-31 VITALS — BP 122/84 | HR 81 | Temp 97.8°F | Ht 68.0 in | Wt 262.3 lb

## 2011-12-31 DIAGNOSIS — Z9884 Bariatric surgery status: Secondary | ICD-10-CM

## 2011-12-31 DIAGNOSIS — D509 Iron deficiency anemia, unspecified: Secondary | ICD-10-CM

## 2011-12-31 DIAGNOSIS — E538 Deficiency of other specified B group vitamins: Secondary | ICD-10-CM

## 2011-12-31 LAB — CBC WITH DIFFERENTIAL/PLATELET
BASO%: 0.3 % (ref 0.0–2.0)
EOS%: 6.4 % (ref 0.0–7.0)
HGB: 11.8 g/dL (ref 11.6–15.9)
LYMPH%: 38.3 % (ref 14.0–49.7)
MCV: 81.5 fL (ref 79.5–101.0)
NEUT#: 3.6 10*3/uL (ref 1.5–6.5)
NEUT%: 49.9 % (ref 38.4–76.8)
Platelets: 245 10*3/uL (ref 145–400)
RDW: 20.1 % — ABNORMAL HIGH (ref 11.2–14.5)

## 2011-12-31 LAB — COMPREHENSIVE METABOLIC PANEL
ALT: 10 U/L (ref 0–35)
AST: 13 U/L (ref 0–37)
CO2: 27 mEq/L (ref 19–32)
Creatinine, Ser: 0.65 mg/dL (ref 0.50–1.10)
Sodium: 137 mEq/L (ref 135–145)
Total Bilirubin: 0.2 mg/dL — ABNORMAL LOW (ref 0.3–1.2)
Total Protein: 7.2 g/dL (ref 6.0–8.3)

## 2011-12-31 NOTE — Progress Notes (Signed)
[facility] INITIAL HEMATOLOGY CONSULTATION  Referral MD:  Dr. Tresa Endo A. Ernestina Penna, M.D. PCP:  Dr. Mickey Farber, M.D.    Reason for Referral:  Iron-deficiency anemia.    HPI:  Meredith Castillo is a 31 year-old Caucasian woman with history of morbid obesity.  I had the chance to review the extensive medical record kindly provided to me for review.  She underwent gastric bypass in 2008.  Ever since, she has been having anemia from presumed malabsorption.  Her baseline Hgb is around 8.  She has been taking OTP iron intermittently without significant improvement of her Hgb.  Prior to this last pregnancy, she never received IV iron.  She has never required pRBC transfusion either.  On 09/10/2011, WBC 9.1; Hgb 8.3; MCV 65; Plt256, vit B12 was 193 pg/mL (ref [id number]).  On 10/08/2011; Iron was 26 ug/dL; saturation 6%; TIBC 161 ug/dL; ferritin 3 ng/mL.  She received Ferahem on 10/16/2011.  She delivered vaginally a baby girl on 11/20/2011.  On 11/22/2011; her WBC 8.6; Hgb 10; Plt 262; Ferratin 73.    Given history of iron deficiency anemia, she was kindly referred to the [facility] for evaluation.   Meredith Castillo presented to the clinic by herself today.  She reported that when she had iron deficiency, she felt very tired with irritability.  Her fatigue now has significantly improved.  She denies all visible source of bleeding except for menstrual period which has not quite returned since her recent delivery 6 weeks ago.  Prior to this last delivery, she denied menometrorrhagia.    Patient denies fatigue, headache, visual changes, confusion, drenching night sweats, palpable lymph node swelling, mucositis, odynophagia, dysphagia, nausea vomiting, jaundice, chest pain, palpitation, shortness of breath, dyspnea on exertion, productive cough, gum bleeding, epistaxis, hematemesis, hemoptysis, abdominal pain, abdominal swelling, early satiety, melena, hematochezia, hematuria, skin rash, spontaneous bleeding, joint  swelling, joint pain, heat or cold intolerance, bowel bladder incontinence, back pain, focal motor weakness, paresthesia, depression, suicidal or homocidal ideation, feeling hopelessness.    Past Medical History  Diagnosis Date  . Bariatric surgery status complicating pregnancy, childbirth, or the puerperium, antepartum condition or complication 2008  . Acute maxillary sinusitis   . Unspecified vitamin D deficiency   . Polycystic ovaries   . Obesity   . Asthma   . PP care - s/p SVD 2/19 11/19/2011  . Iron deficiency anemia     due to gastric bypass.   Marland Kitchen HTN (hypertension)   . PCOS (polycystic ovarian syndrome)   . Vitamin B12 deficiency     due to gastric bypass.   :    Past Surgical History  Procedure Date  . Gastric bypass 2008  . Appendectomy 2005  . Wisdom tooth extraction   . Nasal septum surgery 1996  . Gynecologic surgery 2010    HSG  :   CURRENT MEDS: Current Outpatient Prescriptions  Medication Sig Dispense Refill  . acetaminophen (TYLENOL) 500 MG tablet Take 500 mg by mouth every 6 (six) hours as needed. Patient took this medication for pain.      Marland Kitchen Fe-Succ Ac-C-Thre Ac (NIFEREX-150) 150-50-50 MG CAPS Take 150 mg by mouth daily.  90 each  0  . metformin (FORTAMET) 500 MG (OSM) 24 hr tablet Take 1,000 mg by mouth daily with supper.      . Multiple Vitamins-Minerals (MULTIVITAMIN WITH MINERALS) tablet Take 1 tablet by mouth daily.      . ferumoxytol (FERAHEME) 510 MG/17ML SOLN Inject 17 mLs (510 mg  total) into the vein once.  15.08 mL  0  . Prenatal Vit-Fe Fumarate-FA (PRENATAL MULTIVITAMIN) TABS Take 1 tablet by mouth daily.          Allergies  Allergen Reactions  . Neosporin (Neomycin-Polymyx-Gramicid) Other (See Comments)    Patient states that this medication causes a poison ivy rash like reaction.  . Vancomycin Other (See Comments)    Patient states that when this medication is given intravenously, it causes her kidney's to shut down.  :  Family  History  Problem Relation Age of Onset  . Anesthesia problems Neg Hx   . Hypertension Mother   . Diabetes Mother   . Heart disease Mother   . Cancer Maternal Grandfather     brain  . Heart attack Paternal Grandfather   :  History   Social History  . Marital Status: Single    Spouse Name: N/A    Number of Children: 2  . Years of Education: N/A   Occupational History  .      [employer] employee.    Social History Main Topics  . Smoking status: Former Smoker -- 0.2 packs/day    Quit date: 10/12/2005  . Smokeless tobacco: Never Used  . Alcohol Use: No  . Drug Use: No  . Sexually Active: Yes    Birth Control/ Protection: None   Other Topics Concern  . Not on file   Social History Narrative  . No narrative on file  :  REVIEW OF SYSTEM:  The rest of the 14-point review of sytem was negative.   Exam:   General:  Mildly obese woman in no acute distress.  Eyes:  no scleral icterus.  ENT:  There were no oropharyngeal lesions.  Neck was without thyromegaly.  Lymphatics:  Negative cervical, supraclavicular or axillary adenopathy.  Respiratory: lungs were clear bilaterally without wheezing or crackles.  Cardiovascular:  Regular rate and rhythm, S1/S2, without murmur, rub or gallop.  There was no pedal edema.  GI:  abdomen was soft, flat, nontender, nondistended, without organomegaly.  Muscoloskeletal:  no spinal tenderness of palpation of vertebral spine.  Skin exam was without echymosis, petichae.  Neuro exam was nonfocal.  Patient was able to get on and off exam table without assistance.  Gait was normal.  Patient was alerted and oriented.  Attention was good.   Language was appropriate.  Mood was normal without depression.  Speech was not pressured.  Thought content was not tangential.    LABS:  Lab Results  Component Value Date   WBC 7.2 12/31/2011   HGB 11.8 12/31/2011   HCT 36.9 12/31/2011   PLT 245 12/31/2011   GLUCOSE 76 12/31/2011   ALT 10 12/31/2011   AST 13 12/31/2011   NA  137 12/31/2011   K 4.1 12/31/2011   CL 104 12/31/2011   CREATININE 0.65 12/31/2011   BUN 12 12/31/2011   CO2 27 12/31/2011    Blood smear review:   I personally reviewed the patient's peripheral blood smear today.  There was isocytosis.  There was no peripheral blast.  There was no schistocytosis, spherocytosis, target cell, rouleaux formation, tear drop cell.  There was no giant platelets or platelet clumps.     ASSESSMENT AND PLAN:   1.  History of morbid obesity:  S/p gastric bypass surgery in 2008.  2.  History of iron and Vit B12 deficiency from malabsorption from gastric bypass history. - She last received Ferahem on 10/16/2011 with significant improvement of her  Hgb today.  My review of her blood smear today did not show any concerning finding.  - I sent for Vit B12 level with methylmalonic level to ascertain her VitB12 level as she has not had IM Vit B12 injection which is the best way to increase this storage.  - I advised her to have follow up with [place] with CBC in 2 and 4 months.  She has appointment with Belenda Cruise, NP in about 6 months.  I advised her to continue oral iron to prevent rapid depletion of her iron storage.   I advised her to try either NuIron or Slow Fe along with Vitamin C to increase absorption.  These formulation of OTC iron may have less side effects than others.   - In the future, if despite repletion of her VitB12 and iron, and she still has anemia or develops pancytopenia, then further work up may be considered.   The length of time of the face-to-face encounter was 30 minutes. More than 50% of time was spent counseling and coordination of care.   Thank you for this referral.

## 2011-12-31 NOTE — Patient Instructions (Signed)
1.  Diagnosis:  Iron-deficiency anemia 2.  Your Hgb today is normal.  The recent IV Iron infusion improved your Hgb.  Continue with oral iron (either NuIron 150mg  by mouth once daily; or SlowFe 325mg  by mouth once daily along with Vitamin C to improve absorption).  3.  Follow up:  Lab-ONLY appointment in about 2 and 4 months to monitor your blood count.  Follow up visit with Dr. Lodema Pilot NP Belenda Cruise in about 6 months.

## 2011-12-31 NOTE — Telephone Encounter (Signed)
gv pt appt for [id number]

## 2012-01-05 LAB — FERRITIN: Ferritin: 6 ng/mL — ABNORMAL LOW (ref 10–291)

## 2012-03-03 ENCOUNTER — Other Ambulatory Visit (HOSPITAL_BASED_OUTPATIENT_CLINIC_OR_DEPARTMENT_OTHER): Payer: BC Managed Care – PPO

## 2012-03-03 ENCOUNTER — Telehealth: Payer: Self-pay | Admitting: *Deleted

## 2012-03-03 DIAGNOSIS — D509 Iron deficiency anemia, unspecified: Secondary | ICD-10-CM

## 2012-03-03 DIAGNOSIS — E538 Deficiency of other specified B group vitamins: Secondary | ICD-10-CM

## 2012-03-03 LAB — CBC WITH DIFFERENTIAL/PLATELET
Basophils Absolute: 0.1 10*3/uL (ref 0.0–0.1)
Eosinophils Absolute: 0.4 10*3/uL (ref 0.0–0.5)
HGB: 11.5 g/dL — ABNORMAL LOW (ref 11.6–15.9)
MCV: 80.5 fL (ref 79.5–101.0)
MONO#: 0.4 10*3/uL (ref 0.1–0.9)
MONO%: 4.7 % (ref 0.0–14.0)
NEUT#: 5.1 10*3/uL (ref 1.5–6.5)
RDW: 14.3 % (ref 11.2–14.5)
WBC: 8.3 10*3/uL (ref 3.9–10.3)
lymph#: 2.4 10*3/uL (ref 0.9–3.3)
nRBC: 0 % (ref 0–0)

## 2012-03-03 NOTE — Telephone Encounter (Signed)
Message copied by Wende Mott on Wed Mar 03, 2012  3:22 PM ------      Message from: HA, Raliegh Ip T      Created: Wed Mar 03, 2012  1:47 PM       Please call pt.  Her Hgb is relatively stable (from iron-deficiency anemia).  Continue oral iron along with VitC as tolerate (once a day is fine).  Thanks.

## 2012-03-03 NOTE — Telephone Encounter (Signed)
Left VM for pt informing of labs are stable per Dr. Gaylyn Rong and to call back for more specific information if wanted.  Instructed to keep taking her oral iron along w/ Vit C as directed by Dr. Gaylyn Rong and keep next lab appt in August as scheduled.

## 2012-03-03 NOTE — Telephone Encounter (Signed)
Message copied by Wende Mott on Wed Mar 03, 2012  3:21 PM ------      Message from: HA, Raliegh Ip T      Created: Wed Mar 03, 2012  1:47 PM       Please call pt.  Her Hgb is relatively stable (from iron-deficiency anemia).  Continue oral iron along with VitC as tolerate (once a day is fine).  Thanks.

## 2012-05-05 ENCOUNTER — Other Ambulatory Visit: Payer: BC Managed Care – PPO | Admitting: Lab

## 2012-07-07 ENCOUNTER — Encounter: Payer: Self-pay | Admitting: Oncology

## 2012-07-07 ENCOUNTER — Telehealth: Payer: Self-pay | Admitting: Oncology

## 2012-07-07 ENCOUNTER — Other Ambulatory Visit (HOSPITAL_BASED_OUTPATIENT_CLINIC_OR_DEPARTMENT_OTHER): Payer: BC Managed Care – PPO

## 2012-07-07 ENCOUNTER — Ambulatory Visit (HOSPITAL_BASED_OUTPATIENT_CLINIC_OR_DEPARTMENT_OTHER): Payer: BC Managed Care – PPO | Admitting: Oncology

## 2012-07-07 VITALS — BP 133/83 | HR 96 | Temp 97.0°F | Resp 20 | Ht 68.0 in | Wt 283.7 lb

## 2012-07-07 DIAGNOSIS — D509 Iron deficiency anemia, unspecified: Secondary | ICD-10-CM

## 2012-07-07 DIAGNOSIS — D508 Other iron deficiency anemias: Secondary | ICD-10-CM

## 2012-07-07 DIAGNOSIS — J329 Chronic sinusitis, unspecified: Secondary | ICD-10-CM

## 2012-07-07 DIAGNOSIS — E538 Deficiency of other specified B group vitamins: Secondary | ICD-10-CM

## 2012-07-07 DIAGNOSIS — Z9884 Bariatric surgery status: Secondary | ICD-10-CM

## 2012-07-07 DIAGNOSIS — D518 Other vitamin B12 deficiency anemias: Secondary | ICD-10-CM

## 2012-07-07 LAB — IRON AND TIBC
%SAT: 7 % — ABNORMAL LOW (ref 20–55)
TIBC: 415 ug/dL (ref 250–470)

## 2012-07-07 LAB — CBC WITH DIFFERENTIAL/PLATELET
Basophils Absolute: 0.1 10*3/uL (ref 0.0–0.1)
Eosinophils Absolute: 0.5 10*3/uL (ref 0.0–0.5)
HCT: 34.3 % — ABNORMAL LOW (ref 34.8–46.6)
HGB: 10.9 g/dL — ABNORMAL LOW (ref 11.6–15.9)
LYMPH%: 35 % (ref 14.0–49.7)
MONO#: 0.3 10*3/uL (ref 0.1–0.9)
NEUT#: 3.8 10*3/uL (ref 1.5–6.5)
NEUT%: 53.3 % (ref 38.4–76.8)
Platelets: 265 10*3/uL (ref 145–400)
WBC: 7.2 10*3/uL (ref 3.9–10.3)
lymph#: 2.5 10*3/uL (ref 0.9–3.3)

## 2012-07-07 LAB — COMPREHENSIVE METABOLIC PANEL (CC13)
AST: 11 U/L (ref 5–34)
Albumin: 3.8 g/dL (ref 3.5–5.0)
Alkaline Phosphatase: 97 U/L (ref 40–150)
Glucose: 107 mg/dl — ABNORMAL HIGH (ref 70–99)
Potassium: 3.9 mEq/L (ref 3.5–5.1)
Sodium: 138 mEq/L (ref 136–145)
Total Bilirubin: 0.5 mg/dL (ref 0.20–1.20)
Total Protein: 7 g/dL (ref 6.4–8.3)

## 2012-07-07 LAB — FERRITIN: Ferritin: 3 ng/mL — ABNORMAL LOW (ref 10–291)

## 2012-07-07 MED ORDER — AMOXICILLIN-POT CLAVULANATE 875-125 MG PO TABS: 1.0000 | ORAL_TABLET | Freq: Two times a day (BID) | ORAL | Status: DC

## 2012-07-07 NOTE — Progress Notes (Signed)
[facility]  Telephone:([phone] Fax:([phone]) [phone]   OFFICE PROGRESS NOTE   Cc:  THIES, DAVID, MD  DIAGNOSIS: Iron deficiency anemia.  PAST THERAPY: Feraheme 510 mg x2 doses in January 2013.  CURRENT THERAPY: Watchful observation.  INTERVAL HISTORY: Meredith Castillo 31 y.o. female returns for routine follow-up by herself. Sh has been having more fatigue lately. She denies chest pain, shortness of breath, and dyspnea. She is having regular menstrual periods, but they have been light. She had a Mirena IUD implanted. She is not taking her oral iron routinely. She also reports that she is having facial pain and a sore throat. She thinks she is getting a sinus infection. She continues to work full-time and take care of her two young children.  Past Medical History  Diagnosis Date  . Bariatric surgery status complicating pregnancy, childbirth, or the puerperium, antepartum condition or complication 2008  . Acute maxillary sinusitis   . Unspecified vitamin D deficiency   . Polycystic ovaries   . Obesity   . Asthma   . PP care - s/p SVD 2/19 11/19/2011  . Iron deficiency anemia     due to gastric bypass.   Marland Kitchen HTN (hypertension)   . PCOS (polycystic ovarian syndrome)   . Vitamin B12 deficiency     due to gastric bypass.     Past Surgical History  Procedure Date  . Gastric bypass 2008  . Appendectomy 2005  . Wisdom tooth extraction   . Nasal septum surgery 1996  . Gynecologic surgery 2010    HSG    Current Outpatient Prescriptions  Medication Sig Dispense Refill  . acetaminophen (TYLENOL) 500 MG tablet Take 500 mg by mouth every 6 (six) hours as needed. Patient took this medication for pain.      Marland Kitchen amoxicillin-clavulanate (AUGMENTIN) 875-125 MG per tablet Take 1 tablet by mouth 2 (two) times daily.  20 tablet  0  . Fe-Succ Ac-C-Thre Ac (NIFEREX-150) 150-50-50 MG CAPS Take 150 mg by mouth daily.  90 each  0  . ferumoxytol (FERAHEME) 510 MG/17ML SOLN Inject 17  mLs (510 mg total) into the vein once.  15.08 mL  0  . metformin (FORTAMET) 500 MG (OSM) 24 hr tablet Take 1,000 mg by mouth daily with supper.      . Multiple Vitamins-Minerals (MULTIVITAMIN WITH MINERALS) tablet Take 1 tablet by mouth daily.      . Prenatal Vit-Fe Fumarate-FA (PRENATAL MULTIVITAMIN) TABS Take 1 tablet by mouth daily.        ALLERGIES:  is allergic to neosporin and vancomycin.  REVIEW OF SYSTEMS:  The rest of the 14-point review of system was negative.   Filed Vitals:   07/07/12 1304  BP: 133/83  Pulse: 96  Temp: 97 F (36.1 C)  Resp: 20   Wt Readings from Last 3 Encounters:  07/07/12 283 lb 11.2 oz (128.685 kg)  12/31/11 262 lb 4.8 oz (118.978 kg)  11/18/11 273 lb (123.832 kg)   ECOG Performance status: 0  PHYSICAL EXAMINATION:  General:  well-nourished in no acute distress.  Eyes:  no scleral icterus.  ENT:  There were no oropharyngeal lesions.  Neck was without thyromegaly.  Lymphatics:  Negative cervical, supraclavicular or axillary adenopathy.  Respiratory: lungs were clear bilaterally without wheezing or crackles.  Cardiovascular:  Regular rate and rhythm, S1/S2, without murmur, rub or gallop.  There was no pedal edema.  GI:  abdomen was soft, flat, nontender, nondistended, without organomegaly.  Muscoloskeletal:  no  spinal tenderness of palpation of vertebral spine.  Skin exam was without echymosis, petichae.  Neuro exam was nonfocal.  Patient was able to get on and off exam table without assistance.  Gait was normal.  Patient was alerted and oriented.  Attention was good.   Language was appropriate.  Mood was normal without depression.  Speech was not pressured.  Thought content was not tangential.    LABORATORY/RADIOLOGY DATA:  Lab Results  Component Value Date   WBC 7.2 07/07/2012   HGB 10.9* 07/07/2012   HCT 34.3* 07/07/2012   PLT 265 07/07/2012   GLUCOSE 107* 07/07/2012   ALKPHOS 97 07/07/2012   ALT 12 07/07/2012   AST 11 07/07/2012   NA 138 07/07/2012    K 3.9 07/07/2012   CL 104 07/07/2012   CREATININE 0.7 07/07/2012   BUN 12.0 07/07/2012   CO2 23 07/07/2012    ASSESSMENT AND PLAN:   1. History of morbid obesity: S/p gastric bypass surgery in 2008.   2. History of iron and Vit B12 deficiency from malabsorption from gastric bypass history. She last received Feraheme on 10/16/2011 with significant improvement of her Hgb. Her Hemoglobin has declined to 10.9 with a low MCV. She is having more fatigue. Iron studies are pending. She may need IV iron if they are low. Last Vitamin B12 level was normal. This is pending today. She may require IM Vitamin B12 if this is low.  3. Sinusitis: I have given her a prescription for Augmentin. If no improvement, she will need to follow-up with PCP.  4. Follow-up: In 2 and 4 months for labs. Visit in 6 months.       The length of time of the face-to-face encounter was 15 minutes. More than 50% of time was spent counseling and coordination of care.

## 2012-07-07 NOTE — Telephone Encounter (Signed)
appts made and printed for pt aom °

## 2012-07-08 ENCOUNTER — Telehealth: Payer: Self-pay | Admitting: *Deleted

## 2012-07-08 ENCOUNTER — Other Ambulatory Visit: Payer: Self-pay | Admitting: Oncology

## 2012-07-08 ENCOUNTER — Telehealth: Payer: Self-pay | Admitting: Oncology

## 2012-07-08 DIAGNOSIS — D509 Iron deficiency anemia, unspecified: Secondary | ICD-10-CM

## 2012-07-08 NOTE — Telephone Encounter (Signed)
Message copied by Wende Mott on Thu Jul 08, 2012 10:15 AM ------      Message from: HA, Raliegh Ip T      Created: Thu Jul 08, 2012  9:53 AM       Please call pt.  Her iron is low again.  I will put a POF for her to get IV Feraheme soon.  She should continue with oral iron/Vit C as tolerated.  Her VitB12 is OK for now.  No need to resume VitB12 shot yet.   Thanks.

## 2012-07-08 NOTE — Telephone Encounter (Signed)
Left Vm for pt to return my call.  

## 2012-07-08 NOTE — Telephone Encounter (Signed)
l/m with 10/17 iron appt   aom

## 2012-07-08 NOTE — Telephone Encounter (Signed)
Per staff phone call and POF I have scheduled appts. JMW  

## 2012-07-08 NOTE — Telephone Encounter (Signed)
Pt returned call and I informed her of Kristin's message below.  Informed of low iron,  Order for IV Iron w/i next week and to continue to take oral iron w/ vitamin C as tolerated.   No need for vitamin B12 shot as B12 was wnl.  Expect a call from Scheduler regarding IV faraheme.  Pt verbalized understanding.

## 2012-07-15 ENCOUNTER — Ambulatory Visit (HOSPITAL_BASED_OUTPATIENT_CLINIC_OR_DEPARTMENT_OTHER): Payer: BC Managed Care – PPO

## 2012-07-15 VITALS — BP 131/81 | HR 83 | Temp 97.0°F | Resp 20

## 2012-07-15 DIAGNOSIS — D508 Other iron deficiency anemias: Secondary | ICD-10-CM

## 2012-07-15 DIAGNOSIS — D509 Iron deficiency anemia, unspecified: Secondary | ICD-10-CM

## 2012-07-15 MED ORDER — SODIUM CHLORIDE 0.9 % IV SOLN
1020.0000 mg | Freq: Once | INTRAVENOUS | Status: AC
  Administered 2012-07-15: 1020 mg via INTRAVENOUS
  Filled 2012-07-15: qty 34

## 2012-07-15 NOTE — Patient Instructions (Addendum)
Iron Dextran injection What is this medicine? IRON DEXTRAN (AHY ern DEX tran) is an iron complex. Iron is used to make healthy red blood cells, which carry oxygen and nutrients through the body. This medicine is used to treat people who cannot take iron by mouth and have low levels of iron in the blood. This medicine may be used for other purposes; ask your health care provider or pharmacist if you have questions. What should I tell my health care provider before I take this medicine? They need to know if you have any of these conditions: -anemia not caused by low iron levels -heart disease -high levels of iron in the blood -kidney disease -liver disease -an unusual or allergic reaction to iron, other medicines, foods, dyes, or preservatives -pregnant or trying to get pregnant -breast-feeding How should I use this medicine? This medicine is for injection into a vein or a muscle. It is given by a health care professional in a hospital or clinic setting. Talk to your pediatrician regarding the use of this medicine in children. While this drug may be prescribed for children as young as 4 months old for selected conditions, precautions do apply. Overdosage: If you think you have taken too much of this medicine contact a poison control center or emergency room at once. NOTE: This medicine is only for you. Do not share this medicine with others. What if I miss a dose? It is important not to miss your dose. Call your doctor or health care professional if you are unable to keep an appointment. What may interact with this medicine? Do not take this medicine with any of the following medications: -deferoxamine -dimercaprol -other iron products This medicine may also interact with the following medications: -chloramphenicol -deferasirox This list may not describe all possible interactions. Give your health care provider a list of all the medicines, herbs, non-prescription drugs, or dietary  supplements you use. Also tell them if you smoke, drink alcohol, or use illegal drugs. Some items may interact with your medicine. What should I watch for while using this medicine? Visit your doctor or health care professional regularly. Tell your doctor if your symptoms do not start to get better or if they get worse. You may need blood work done while you are taking this medicine. You may need to follow a special diet. Talk to your doctor. Foods that contain iron include: whole grains/cereals, dried fruits, beans, or peas, leafy green vegetables, and organ meats (liver, kidney). Long-term use of this medicine may increase your risk of some cancers. Talk to your doctor about how to limit your risk. What side effects may I notice from receiving this medicine? Side effects that you should report to your doctor or health care professional as soon as possible: -allergic reactions like skin rash, itching or hives, swelling of the face, lips, or tongue -blue lips, nails, or skin -breathing problems -changes in blood pressure -chest pain -confusion -fast, irregular heartbeat -feeling faint or lightheaded, falls -fever or chills -flushing, sweating, or hot feelings -joint or muscle aches or pains -pain, tingling, numbness in the hands or feet -seizures -unusually weak or tired Side effects that usually do not require medical attention (report to your doctor or health care professional if they continue or are bothersome): -change in taste (metallic taste) -diarrhea -headache -irritation at site where injected -nausea, vomiting -stomach upset This list may not describe all possible side effects. Call your doctor for medical advice about side effects. You may report side effects   to FDA at 1-800-FDA-1088. Where should I keep my medicine? This drug is given in a hospital or clinic and will not be stored at home. NOTE: This sheet is a summary. It may not cover all possible information. If you have  questions about this medicine, talk to your doctor, pharmacist, or health care provider.  2012, Elsevier/Gold Standard. (02/01/2008 4:59:50 PM)Iron Deficiency Anemia  Anemia is when you have a low number of healthy red blood cells. HOME CARE   Ask your doctor or dietician what foods you should eat.  Take iron and vitamins as told by your doctor.  Eat foods that have iron in them. This includes liver, lean beef, whole-grain bread, eggs, dried fruit, and dark green leafy vegetables. GET HELP RIGHT AWAY IF:  You pass out (faint).  You have chest pain.  You feel sick to your stomach (nauseous) or throw up (vomit).  You get very short of breath with activity.  You are weak.  You are thirstier than normal.  You have a fast heartbeat.  You start to sweat or become lightheaded when getting up from a chair or bed. MAKE SURE YOU:  Understand these instructions.  Will watch your condition.  Will get help right away if you are not doing well or get worse. Document Released: 10/18/2010 Document Revised: 12/08/2011 Document Reviewed: 10/18/2010 [facility] Patient Information 2013 [place], [place].

## 2012-07-28 ENCOUNTER — Ambulatory Visit: Payer: Self-pay | Admitting: Family Medicine

## 2012-09-08 ENCOUNTER — Other Ambulatory Visit (HOSPITAL_BASED_OUTPATIENT_CLINIC_OR_DEPARTMENT_OTHER): Payer: BC Managed Care – PPO | Admitting: Lab

## 2012-09-08 DIAGNOSIS — E538 Deficiency of other specified B group vitamins: Secondary | ICD-10-CM

## 2012-09-08 DIAGNOSIS — D509 Iron deficiency anemia, unspecified: Secondary | ICD-10-CM

## 2012-09-08 LAB — CBC WITH DIFFERENTIAL/PLATELET
Eosinophils Absolute: 0.5 10*3/uL (ref 0.0–0.5)
LYMPH%: 27.9 % (ref 14.0–49.7)
MONO#: 0.4 10*3/uL (ref 0.1–0.9)
NEUT#: 5.6 10*3/uL (ref 1.5–6.5)
Platelets: 253 10*3/uL (ref 145–400)
RBC: 5.04 10*6/uL (ref 3.70–5.45)
RDW: 22.1 % — ABNORMAL HIGH (ref 11.2–14.5)
WBC: 9 10*3/uL (ref 3.9–10.3)

## 2012-09-13 ENCOUNTER — Telehealth: Payer: Self-pay

## 2012-09-13 NOTE — Telephone Encounter (Signed)
Message copied by Kallie Locks on Mon Sep 13, 2012  3:03 PM ------      Message from: HA, Raliegh Ip T      Created: Mon Sep 13, 2012 12:47 PM       Please call pt.  Her Hgb is now normal (from iron deficiency anemia).  Continue oral iron as tolerated.  Thanks.

## 2012-11-10 ENCOUNTER — Other Ambulatory Visit (HOSPITAL_BASED_OUTPATIENT_CLINIC_OR_DEPARTMENT_OTHER): Payer: BC Managed Care – PPO | Admitting: Lab

## 2012-11-10 ENCOUNTER — Telehealth: Payer: Self-pay | Admitting: *Deleted

## 2012-11-10 DIAGNOSIS — D509 Iron deficiency anemia, unspecified: Secondary | ICD-10-CM

## 2012-11-10 LAB — CBC WITH DIFFERENTIAL/PLATELET
Basophils Absolute: 0.1 10*3/uL (ref 0.0–0.1)
Eosinophils Absolute: 0.4 10*3/uL (ref 0.0–0.5)
HCT: 40.9 % (ref 34.8–46.6)
HGB: 13.9 g/dL (ref 11.6–15.9)
LYMPH%: 29.6 % (ref 14.0–49.7)
MONO#: 0.6 10*3/uL (ref 0.1–0.9)
NEUT#: 4.3 10*3/uL (ref 1.5–6.5)
NEUT%: 56.5 % (ref 38.4–76.8)
Platelets: 220 10*3/uL (ref 145–400)
WBC: 7.7 10*3/uL (ref 3.9–10.3)

## 2012-11-10 NOTE — Telephone Encounter (Signed)
Message copied by Wende Mott on Wed Nov 10, 2012 10:06 AM ------      Message from: Clenton Pare R      Created: Wed Nov 10, 2012  9:29 AM       Call pt. Hgb is normal. Continue observation ------

## 2012-11-10 NOTE — Telephone Encounter (Signed)
Left VM for pt informing labs wnl and to keep next appt in April as scheduled.  Call back if any specific questions.

## 2012-12-16 ENCOUNTER — Ambulatory Visit (INDEPENDENT_AMBULATORY_CARE_PROVIDER_SITE_OTHER): Payer: BC Managed Care – PPO | Admitting: Family Medicine

## 2012-12-16 ENCOUNTER — Encounter: Payer: Self-pay | Admitting: Family Medicine

## 2012-12-16 VITALS — BP 130/82 | HR 92 | Temp 98.2°F | Ht 68.0 in | Wt 283.8 lb

## 2012-12-16 DIAGNOSIS — J309 Allergic rhinitis, unspecified: Secondary | ICD-10-CM

## 2012-12-16 DIAGNOSIS — Z6841 Body Mass Index (BMI) 40.0 and over, adult: Secondary | ICD-10-CM

## 2012-12-16 DIAGNOSIS — Z9884 Bariatric surgery status: Secondary | ICD-10-CM

## 2012-12-16 DIAGNOSIS — J329 Chronic sinusitis, unspecified: Secondary | ICD-10-CM

## 2012-12-16 DIAGNOSIS — E282 Polycystic ovarian syndrome: Secondary | ICD-10-CM | POA: Insufficient documentation

## 2012-12-16 MED ORDER — MUPIROCIN 2 % EX OINT: TOPICAL_OINTMENT | Freq: Three times a day (TID) | CUTANEOUS | Status: DC

## 2012-12-16 MED ORDER — FLUTICASONE PROPIONATE 50 MCG/ACT NA SUSP: 2.0000 | Freq: Every day | NASAL | Status: DC

## 2012-12-16 MED ORDER — LEVOFLOXACIN 500 MG PO TABS: 500.0000 mg | ORAL_TABLET | Freq: Every day | ORAL | Status: DC

## 2012-12-16 NOTE — Progress Notes (Signed)
Nature conservation officer at [facility] [address] [place] [address] Phone: [phone] Fax: [phone]  Date:  12/16/2012   Name:  Meredith Castillo   DOB:  12/03/1980   MRN:  [id number] Gender: female Age: 32 y.o.  Primary Physician:  Hannah Beat, MD  Evaluating MD: Hannah Beat, MD   Chief Complaint: Establish Care   History of Present Illness:  Meredith Castillo is a 32 y.o. pleasant patient who presents with the following:  Patient post gastric bypass, formerly 400 pounds, now down to 280 post 2 babies. Was going to [facility].   Metformin for PCOS (1000 mg ER), and she sees Dr. Noland Fordyce. a1c has always been normal and with no DM per her report.   97 and 79 year old children  Rou en Y (Gastric bypass) Lost down from 400  Mirena.  Anemia: needs IV iron Saw Dr. Gaylyn Rong. Difficulty with absorption after her Rou-en-Y and she has needed multiple IV iron infusions  1st daughter, got pretty anemic 2nd daughter not intentional.   Sinusitis: 1 month ago, went to UC given Augmentin, felt a little better, then ultimately went back and given LVQ of unknown dose, 7 day supply and she is a little better, but she still has fairly severe maxillary sinus pain.  Patient Active Problem List  Diagnosis  . Iron deficiency anemia  . Vitamin B12 deficiency  . PCOS (polycystic ovarian syndrome)  . Morbid obesity with BMI of 40.0-44.9, adult  . Allergic rhinitis  . Recurrent sinus infections  . H/O gastric bypass    Past Medical History  Diagnosis Date  . Bariatric surgery status complicating pregnancy, childbirth, or the puerperium, antepartum condition or complication 2008  . Unspecified vitamin D deficiency   . Morbid obesity with BMI of 40.0-44.9, adult   . Asthma   . PP care - s/p SVD 2/19 11/19/2011  . Iron deficiency anemia     due to gastric bypass.   Marland Kitchen HTN (hypertension)   . PCOS (polycystic ovarian syndrome)   . Vitamin B12 deficiency     due  to gastric bypass.     Past Surgical History  Procedure Laterality Date  . Gastric bypass  2008    Rou-en-Y  . Appendectomy  2005  . Wisdom tooth extraction    . Nasal septum surgery  1996  . Gynecologic surgery  2010    HSG    History   Social History  . Marital Status: Single    Spouse Name: N/A    Number of Children: 2  . Years of Education: N/A   Occupational History  .      [employer] employee.    Social History Main Topics  . Smoking status: Former Smoker -- 0.25 packs/day    Quit date: 10/12/2005  . Smokeless tobacco: Never Used  . Alcohol Use: No  . Drug Use: No  . Sexually Active: Yes    Birth Control/ Protection: None   Other Topics Concern  . Not on file   Social History Narrative  . No narrative on file    Family History  Problem Relation Age of Onset  . Anesthesia problems Neg Hx   . Hypertension Mother   . Diabetes Mother   . Heart disease Mother   . Cancer Maternal Grandfather     brain  . Heart attack Paternal Grandfather     Allergies  Allergen Reactions  . Neosporin (Neomycin-Polymyxin-Gramicidin) Other (See Comments)  Patient states that this medication causes a poison ivy rash like reaction.  . Vancomycin Other (See Comments)    Patient states that when this medication is given intravenously, it causes her kidney's to shut down.    Current Outpatient Prescriptions on File Prior to Visit  Medication Sig Dispense Refill  . acetaminophen (TYLENOL) 500 MG tablet Take 500 mg by mouth every 6 (six) hours as needed. Patient took this medication for pain.      Marland Kitchen Fe-Succ Ac-C-Thre Ac (NIFEREX-150) 150-50-50 MG CAPS Take 150 mg by mouth daily.  90 each  0  . metformin (FORTAMET) 500 MG (OSM) 24 hr tablet Take 1,000 mg by mouth daily with supper.       No current facility-administered medications on file prior to visit.     Review of Systems:  ROS: GEN: Acute illness details above GI: Tolerating PO intake GU: maintaining adequate  hydration and urination Pulm: No SOB Interactive and getting along well at home.  Otherwise, ROS is as per the HPI.   Physical Examination: BP 130/82  Pulse 92  Temp(Src) 98.2 F (36.8 C) (Oral)  Ht 5\' 8"  (1.727 m)  Wt 283 lb 12 oz (128.708 kg)  BMI 43.15 kg/m2  SpO2 97%  Ideal Body Weight: Weight in (lb) to have BMI = 25: 164.1   Gen: WDWN, NAD; alert,appropriate and cooperative throughout exam  HEENT: Normocephalic and atraumatic. Throat clear, w/o exudate, no LAD, R TM clear, L TM - good landmarks, No fluid present. rhinnorhea.  Left frontal and maxillary sinuses: Tender, max Right frontal and maxillary sinuses: Tender, max  Neck: No ant or post LAD CV: RRR, No M/G/R Pulm: Breathing comfortably in no resp distress. no w/c/r Abd: S,NT,ND,+BS Extr: no c/c/e Psych: full affect, pleasant   Assessment and Plan:  Recurrent sinus infections  PCOS (polycystic ovarian syndrome)  Morbid obesity with BMI of 40.0-44.9, adult  Allergic rhinitis  H/O gastric bypass  Severe max pain c/w recalcitrant sinusitis: extend ABX by 7 more days Add flonase  Orders Today:  No orders of the defined types were placed in this encounter.    Updated Medication List: (Includes new medications, updates to list, dose adjustments) Meds ordered this encounter  Medications  . folic acid (FOLVITE) 1 MG tablet    Sig: Take 1 mg by mouth daily.  . mupirocin ointment (BACTROBAN) 2 %    Sig: Apply topically 3 (three) times daily.    Dispense:  30 g    Refill:  5  . levofloxacin (LEVAQUIN) 500 MG tablet    Sig: Take 1 tablet (500 mg total) by mouth daily.    Dispense:  7 tablet    Refill:  0  . fluticasone (FLONASE) 50 MCG/ACT nasal spray    Sig: Place 2 sprays into the nose daily.    Dispense:  16 g    Refill:  12    Medications Discontinued: Medications Discontinued During This Encounter  Medication Reason  . amoxicillin-clavulanate (AUGMENTIN) 875-125 MG per tablet Error  .  ferumoxytol (FERAHEME) 510 MG/17ML SOLN Error  . Multiple Vitamins-Minerals (MULTIVITAMIN WITH MINERALS) tablet Error  . Prenatal Vit-Fe Fumarate-FA (PRENATAL MULTIVITAMIN) TABS Error      Signed, Kolsen Choe T. Teron Blais, MD 12/16/2012 2:13 PM

## 2012-12-17 DIAGNOSIS — J309 Allergic rhinitis, unspecified: Secondary | ICD-10-CM | POA: Insufficient documentation

## 2012-12-17 DIAGNOSIS — Z9884 Bariatric surgery status: Secondary | ICD-10-CM | POA: Insufficient documentation

## 2012-12-17 DIAGNOSIS — J329 Chronic sinusitis, unspecified: Secondary | ICD-10-CM | POA: Insufficient documentation

## 2013-01-05 ENCOUNTER — Ambulatory Visit (HOSPITAL_BASED_OUTPATIENT_CLINIC_OR_DEPARTMENT_OTHER): Payer: BC Managed Care – PPO | Admitting: Oncology

## 2013-01-05 ENCOUNTER — Telehealth: Payer: Self-pay | Admitting: Oncology

## 2013-01-05 ENCOUNTER — Other Ambulatory Visit (HOSPITAL_BASED_OUTPATIENT_CLINIC_OR_DEPARTMENT_OTHER): Payer: BC Managed Care – PPO | Admitting: Lab

## 2013-01-05 VITALS — BP 129/89 | HR 69 | Temp 98.4°F | Resp 18 | Ht 68.0 in | Wt 280.3 lb

## 2013-01-05 DIAGNOSIS — L0291 Cutaneous abscess, unspecified: Secondary | ICD-10-CM

## 2013-01-05 DIAGNOSIS — D509 Iron deficiency anemia, unspecified: Secondary | ICD-10-CM

## 2013-01-05 DIAGNOSIS — L0390 Cellulitis, unspecified: Secondary | ICD-10-CM

## 2013-01-05 LAB — CBC WITH DIFFERENTIAL/PLATELET
Eosinophils Absolute: 0.2 10*3/uL (ref 0.0–0.5)
HCT: 43.4 % (ref 34.8–46.6)
LYMPH%: 30.5 % (ref 14.0–49.7)
MCHC: 32.7 g/dL (ref 31.5–36.0)
MCV: 88.9 fL (ref 79.5–101.0)
MONO#: 0.4 10*3/uL (ref 0.1–0.9)
MONO%: 6.7 % (ref 0.0–14.0)
NEUT#: 3.8 10*3/uL (ref 1.5–6.5)
NEUT%: 58.7 % (ref 38.4–76.8)
Platelets: 245 10*3/uL (ref 145–400)
RBC: 4.88 10*6/uL (ref 3.70–5.45)

## 2013-01-05 LAB — COMPREHENSIVE METABOLIC PANEL (CC13)
Alkaline Phosphatase: 103 U/L (ref 40–150)
BUN: 11.7 mg/dL (ref 7.0–26.0)
CO2: 27 mEq/L (ref 22–29)
Creatinine: 0.8 mg/dL (ref 0.6–1.1)
Glucose: 75 mg/dl (ref 70–99)
Sodium: 140 mEq/L (ref 136–145)
Total Bilirubin: 0.52 mg/dL (ref 0.20–1.20)
Total Protein: 7.5 g/dL (ref 6.4–8.3)

## 2013-01-05 LAB — IRON AND TIBC
%SAT: 22 % (ref 20–55)
Iron: 70 ug/dL (ref 42–145)
TIBC: 318 ug/dL (ref 250–470)
UIBC: 248 ug/dL (ref 125–400)

## 2013-01-05 LAB — VITAMIN B12: Vitamin B-12: 260 pg/mL (ref 211–911)

## 2013-01-05 LAB — FERRITIN: Ferritin: 19 ng/mL (ref 10–291)

## 2013-01-05 MED ORDER — DOXYCYCLINE HYCLATE 100 MG PO TABS: 100.0000 mg | ORAL_TABLET | Freq: Two times a day (BID) | ORAL | Status: DC

## 2013-01-05 NOTE — Patient Instructions (Addendum)
1. Cellulitis:  Doxycycline 100mg  by mouth twice daily x 10 days.   Please make appointment with Dr. Dallas Schimke to see her in about 7-10 days in case your cellulitis is not improved.  DO NOT BREAST FEED or GET PREGNANT on Doxycycline.  2.  Iron deficiency anemia:  Improved.  Continue oral iron.

## 2013-01-05 NOTE — Telephone Encounter (Signed)
gv and printed appt schedule for pt for Aug, Dec and April

## 2013-01-05 NOTE — Progress Notes (Signed)
[facility]  [phone] [phone]   OFFICE PROGRESS NOTE   Cc:  Hannah Beat, MD  DIAGNOSIS: iron deficiency anemia.   PAST THERAPY:  IV iron  CURRENT THERAPY: oral iron  INTERVAL HISTORY: Meredith Castillo 32 y.o. female returns for regular follow up by herself.  She has mild fatigue taking care of a 41-month old daughter who is very active.  She has been taking oral iron without problem.  She has not seen any source of bleeding.  She has light menstrual bleeding since she's been on NuvaRing.  She denied SOB, chest pain, abdominal pain, dizziness, ice picca.  She developed a progressive rash, tenderness, with purulent discharge in the navel cavity.  She has tried to keep it clean with antibiotic ointment without relief.    The rest of the 14-point review of system was negative.   Past Medical History  Diagnosis Date  . Bariatric surgery status complicating pregnancy, childbirth, or the puerperium, antepartum condition or complication 2008  . Unspecified vitamin D deficiency   . Morbid obesity with BMI of 40.0-44.9, adult   . Asthma   . PP care - s/p SVD 2/19 11/19/2011  . Iron deficiency anemia     due to gastric bypass.   Marland Kitchen HTN (hypertension)   . PCOS (polycystic ovarian syndrome)   . Vitamin B12 deficiency     due to gastric bypass.     Past Surgical History  Procedure Laterality Date  . Gastric bypass  2008    Rou-en-Y  . Appendectomy  2005  . Wisdom tooth extraction    . Nasal septum surgery  1996  . Gynecologic surgery  2010    HSG    Current Outpatient Prescriptions  Medication Sig Dispense Refill  . acetaminophen (TYLENOL) 500 MG tablet Take 500 mg by mouth every 6 (six) hours as needed. Patient took this medication for pain.      Marland Kitchen Fe-Succ Ac-C-Thre Ac (NIFEREX-150) 150-50-50 MG CAPS Take 150 mg by mouth daily.  90 each  0  . fluticasone (FLONASE) 50 MCG/ACT nasal spray Place 2 sprays into the nose daily.  16 g  12  .  folic acid (FOLVITE) 1 MG tablet Take 1 mg by mouth daily.      Marland Kitchen ipratropium (ATROVENT) 0.06 % nasal spray Use as directed as needed      . metformin (FORTAMET) 500 MG (OSM) 24 hr tablet Take 1,000 mg by mouth daily with supper.      . mupirocin ointment (BACTROBAN) 2 % Apply topically 3 (three) times daily.  30 g  5  . doxycycline (VIBRA-TABS) 100 MG tablet Take 1 tablet (100 mg total) by mouth 2 (two) times daily.  20 tablet  0   No current facility-administered medications for this visit.    ALLERGIES:  is allergic to neosporin and vancomycin.  REVIEW OF SYSTEMS:  The rest of the 14-point review of system was negative.   Filed Vitals:   01/05/13 1012  BP: 129/89  Pulse: 69  Temp: 98.4 F (36.9 C)  Resp: 18   Wt Readings from Last 3 Encounters:  01/05/13 280 lb 4.8 oz (127.143 kg)  12/16/12 283 lb 12 oz (128.708 kg)  07/07/12 283 lb 11.2 oz (128.685 kg)   ECOG Performance status: 0  PHYSICAL EXAMINATION:   General:  Mildly obese woman, in no acute distress.  Eyes:  no scleral icterus.  ENT:  There were no oropharyngeal lesions.  Neck was  without thyromegaly.  Lymphatics:  Negative cervical, supraclavicular or axillary adenopathy.  Respiratory: lungs were clear bilaterally without wheezing or crackles.  Cardiovascular:  Regular rate and rhythm, S1/S2, without murmur, rub or gallop.  There was no pedal edema.  GI:  abdomen was soft, flat, nontender, nondistended, without organomegaly. There was an area of about 5cm erythematous, with purulent discharge around her navel.  Muscoloskeletal:  no spinal tenderness of palpation of vertebral spine.  Skin exam was without echymosis, petichae.  Neuro exam was nonfocal.  Patient was able to get on and off exam table without assistance.  Gait was normal.  Patient was alerted and oriented.  Attention was good.   Language was appropriate.  Mood was normal without depression.  Speech was not pressured.  Thought content was not tangential.       LABORATORY/RADIOLOGY DATA:  Lab Results  Component Value Date   WBC 6.4 01/05/2013   HGB 14.2 01/05/2013   HCT 43.4 01/05/2013   PLT 245 01/05/2013   GLUCOSE 75 01/05/2013   ALKPHOS 103 01/05/2013   ALT 13 01/05/2013   AST 12 01/05/2013   NA 140 01/05/2013   K 4.7 01/05/2013   CL 106 01/05/2013   CREATININE 0.8 01/05/2013   BUN 11.7 01/05/2013   CO2 27 01/05/2013     ASSESSMENT AND PLAN:   1.  History of iron deficiency anemia most likely due to menstrual blood loss.  This is better with NuvaRing.  She has been taking oral iron without anemia.  I will continue to check CBC and iron panel every 4 months for now and will space out in the future once stable.  2.  Cellulitis:  Doxycycline 100mg  by mouth twice daily x 10 days.   I advised her to make appointment with Dr. Dallas Schimke to see her in about 7-10 days in case her cellulitis is not improved.  I advised her not to breast feed or become pregnant while on Doxycycline.   3.  Follow up:  In about 1 year.  The length of time of the face-to-face encounter was 15 minutes. More than 50% of time was spent counseling and coordination of care.

## 2013-02-28 ENCOUNTER — Ambulatory Visit (INDEPENDENT_AMBULATORY_CARE_PROVIDER_SITE_OTHER): Payer: BC Managed Care – PPO | Admitting: Family Medicine

## 2013-02-28 ENCOUNTER — Encounter: Payer: Self-pay | Admitting: Family Medicine

## 2013-02-28 VITALS — BP 120/64 | HR 76 | Temp 98.4°F | Ht 68.0 in | Wt 282.0 lb

## 2013-02-28 DIAGNOSIS — J069 Acute upper respiratory infection, unspecified: Secondary | ICD-10-CM

## 2013-02-28 DIAGNOSIS — J020 Streptococcal pharyngitis: Secondary | ICD-10-CM

## 2013-02-28 LAB — POCT RAPID STREP A (OFFICE): Rapid Strep A Screen: POSITIVE — AB

## 2013-02-28 MED ORDER — AMOXICILLIN 875 MG PO TABS: 875.0000 mg | ORAL_TABLET | Freq: Two times a day (BID) | ORAL | Status: DC

## 2013-02-28 MED ORDER — BENZONATATE 100 MG PO CAPS: 100.0000 mg | ORAL_CAPSULE | Freq: Three times a day (TID) | ORAL | Status: DC | PRN

## 2013-02-28 NOTE — Progress Notes (Signed)
Nature conservation officer at [facility] [address] [place] [address] Phone: [phone] Fax: [phone]  Date:  02/28/2013   Name:  Meredith Castillo   DOB:  24-Dec-1980   MRN:  [id number] Gender: female Age: 32 y.o.  Primary Physician:  Hannah Beat, MD  Evaluating MD: Hannah Beat, MD   Chief Complaint: Sinusitis and Cough   History of Present Illness:  Meredith Castillo is a 32 y.o. pleasant patient who presents with the following:  > 1 week, may have some sore and swollen tonsills, and having a lot of drainage. No fever, no aches. Has not missed work. Cough, non-prod has been pretty bad. No n/v/d. No sig ear pain. No rhinnorhea.    Patient Active Problem List   Diagnosis Date Noted  . Allergic rhinitis 12/17/2012  . Recurrent sinus infections 12/17/2012  . H/O gastric bypass 12/17/2012  . PCOS (polycystic ovarian syndrome)   . Morbid obesity with BMI of 40.0-44.9, adult   . Iron deficiency anemia   . Vitamin B12 deficiency     Past Medical History  Diagnosis Date  . Bariatric surgery status complicating pregnancy, childbirth, or the puerperium, antepartum condition or complication 2008  . Unspecified vitamin D deficiency   . Morbid obesity with BMI of 40.0-44.9, adult   . Asthma   . PP care - s/p SVD 2/19 11/19/2011  . Iron deficiency anemia     due to gastric bypass.   Marland Kitchen HTN (hypertension)   . PCOS (polycystic ovarian syndrome)   . Vitamin B12 deficiency     due to gastric bypass.     Past Surgical History  Procedure Laterality Date  . Gastric bypass  2008    Rou-en-Y  . Appendectomy  2005  . Wisdom tooth extraction    . Nasal septum surgery  1996  . Gynecologic surgery  2010    HSG    History   Social History  . Marital Status: Single    Spouse Name: N/A    Number of Children: 2  . Years of Education: N/A   Occupational History  .      [employer] employee.    Social History Main Topics  . Smoking status: Former Smoker -- 0.25  packs/day    Quit date: 10/12/2005  . Smokeless tobacco: Never Used  . Alcohol Use: No  . Drug Use: No  . Sexually Active: Yes    Birth Control/ Protection: None   Other Topics Concern  . Not on file   Social History Narrative  . No narrative on file    Family History  Problem Relation Age of Onset  . Anesthesia problems Neg Hx   . Hypertension Mother   . Diabetes Mother   . Heart disease Mother   . Cancer Maternal Grandfather     brain  . Heart attack Paternal Grandfather     Allergies  Allergen Reactions  . Neosporin (Neomycin-Polymyxin-Gramicidin) Other (See Comments)    Patient states that this medication causes a poison ivy rash like reaction.  . Vancomycin Other (See Comments)    Patient states that when this medication is given intravenously, it causes her kidney's to shut down.    Medication list has been reviewed and updated.  Outpatient Prescriptions Prior to Visit  Medication Sig Dispense Refill  . acetaminophen (TYLENOL) 500 MG tablet Take 500 mg by mouth every 6 (six) hours as needed. Patient took this medication for pain.      Marland Kitchen  fluticasone (FLONASE) 50 MCG/ACT nasal spray Place 2 sprays into the nose daily.  16 g  12  . ipratropium (ATROVENT) 0.06 % nasal spray Use as directed as needed      . metformin (FORTAMET) 500 MG (OSM) 24 hr tablet Take 1,000 mg by mouth daily with supper.      . mupirocin ointment (BACTROBAN) 2 % Apply topically 3 (three) times daily.  30 g  5  . doxycycline (VIBRA-TABS) 100 MG tablet Take 1 tablet (100 mg total) by mouth 2 (two) times daily.  20 tablet  0  . Fe-Succ Ac-C-Thre Ac (NIFEREX-150) 150-50-50 MG CAPS Take 150 mg by mouth daily.  90 each  0  . folic acid (FOLVITE) 1 MG tablet Take 1 mg by mouth daily.       No facility-administered medications prior to visit.    Review of Systems:  ROS: GEN: Acute illness details above GI: Tolerating PO intake GU: maintaining adequate hydration and urination Pulm: No  SOB Interactive and getting along well at home.  Otherwise, ROS is as per the HPI.   Physical Examination: BP 120/64  Pulse 76  Temp(Src) 98.4 F (36.9 C) (Oral)  Ht 5\' 8"  (1.727 m)  Wt 282 lb (127.914 kg)  BMI 42.89 kg/m2  SpO2 99%  Ideal Body Weight: Weight in (lb) to have BMI = 25: 164.1   Gen: WDWN, NAD; A & O x3, cooperative. Pleasant.Globally Non-toxic HEENT: Normocephalic and atraumatic. Throat: swollen tonsills with exudate R TM clear, L TM - good landmarks, No fluid present. rhinnorhea. No frontal or maxillary sinus T. MMM NECK: Anterior cervical  LAD is present - TTP CV: RRR, No M/G/R, cap refill <2 sec PULM: Breathing comfortably in no respiratory distress. no wheezing, crackles, rhonchi ABD: S,NT,ND,+BS. No HSM. No rebound. EXT: No c/c/e PSYCH: Friendly, good eye contact   Assessment and Plan:  Streptococcal sore throat - Plan: POCT rapid strep A, amoxicillin (AMOXIL) 875 MG tablet  URI (upper respiratory infection) - Plan: benzonatate (TESSALON) 100 MG capsule, DISCONTINUED: benzonatate (TESSALON) 100 MG capsule  URI with superimposed ST Amox Supportive care  Results for orders placed in visit on 02/28/13  POCT RAPID STREP A (OFFICE)      Result Value Range   Rapid Strep A Screen Positive (*) Negative     Orders Today:  Orders Placed This Encounter  Procedures  . POCT rapid strep A    Updated Medication List: (Includes new medications, updates to list, dose adjustments) Meds ordered this encounter  Medications  . amoxicillin (AMOXIL) 875 MG tablet    Sig: Take 1 tablet (875 mg total) by mouth 2 (two) times daily.    Dispense:  20 tablet    Refill:  0  . DISCONTD: benzonatate (TESSALON) 100 MG capsule    Sig: Take 1 capsule (100 mg total) by mouth 3 (three) times daily as needed for cough.    Dispense:  40 capsule    Refill:  0  . benzonatate (TESSALON) 100 MG capsule    Sig: Take 1 capsule (100 mg total) by mouth 3 (three) times daily as  needed for cough.    Dispense:  40 capsule    Refill:  0    Medications Discontinued: Medications Discontinued During This Encounter  Medication Reason  . doxycycline (VIBRA-TABS) 100 MG tablet Error  . Fe-Succ Ac-C-Thre Ac (NIFEREX-150) 150-50-50 MG CAPS Error  . folic acid (FOLVITE) 1 MG tablet Error  . ipratropium (ATROVENT) 0.06 % nasal spray  Error  . metformin (FORTAMET) 500 MG (OSM) 24 hr tablet Error  . mupirocin ointment (BACTROBAN) 2 % Error  . benzonatate (TESSALON) 100 MG capsule Reorder      Signed, Karleen Hampshire T. Konstantinos Cordoba, MD 02/28/2013 11:12 AM

## 2013-03-05 ENCOUNTER — Encounter: Payer: Self-pay | Admitting: Family Medicine

## 2013-03-05 ENCOUNTER — Ambulatory Visit (INDEPENDENT_AMBULATORY_CARE_PROVIDER_SITE_OTHER): Payer: BC Managed Care – PPO | Admitting: Family Medicine

## 2013-03-05 VITALS — BP 120/80 | HR 80 | Temp 98.4°F | Wt 282.0 lb

## 2013-03-05 DIAGNOSIS — J020 Streptococcal pharyngitis: Secondary | ICD-10-CM | POA: Insufficient documentation

## 2013-03-05 MED ORDER — AMOXICILLIN-POT CLAVULANATE 875-125 MG PO TABS: 1.0000 | ORAL_TABLET | Freq: Two times a day (BID) | ORAL | Status: DC

## 2013-03-05 NOTE — Assessment & Plan Note (Signed)
Initial imp with amox but then st re occurred Will cover with augmentin Disc symptomatic care - see instructions on AVS Update if not starting to improve in a week or if worsening

## 2013-03-05 NOTE — Progress Notes (Signed)
Subjective:    Patient ID: Meredith Castillo, female    DOB: 1981-04-10, 32 y.o.   MRN: [id number]  HPI Has been on amoxicillin for strep throat on Monday - Dr Patsy Lager  rst was pos and thought she also had a uri  Tessalon for cough   Improved tues/wed and thurs fri am - st and that is worse since then , hurts to swallow No missed doses of her amox   No fever  No rash   Cough is a lot better   Patient Active Problem List   Diagnosis Date Noted  . Allergic rhinitis 12/17/2012  . Recurrent sinus infections 12/17/2012  . H/O gastric bypass 12/17/2012  . PCOS (polycystic ovarian syndrome)   . Morbid obesity with BMI of 40.0-44.9, adult   . Iron deficiency anemia   . Vitamin B12 deficiency    Past Medical History  Diagnosis Date  . Bariatric surgery status complicating pregnancy, childbirth, or the puerperium, antepartum condition or complication 2008  . Unspecified vitamin D deficiency   . Morbid obesity with BMI of 40.0-44.9, adult   . Asthma   . PP care - s/p SVD 2/19 11/19/2011  . Iron deficiency anemia     due to gastric bypass.   Marland Kitchen HTN (hypertension)   . PCOS (polycystic ovarian syndrome)   . Vitamin B12 deficiency     due to gastric bypass.    Past Surgical History  Procedure Laterality Date  . Gastric bypass  2008    Rou-en-Y  . Appendectomy  2005  . Wisdom tooth extraction    . Nasal septum surgery  1996  . Gynecologic surgery  2010    HSG   History  Substance Use Topics  . Smoking status: Former Smoker -- 0.25 packs/day    Quit date: 10/12/2005  . Smokeless tobacco: Never Used  . Alcohol Use: No   Family History  Problem Relation Age of Onset  . Anesthesia problems Neg Hx   . Hypertension Mother   . Diabetes Mother   . Heart disease Mother   . Cancer Maternal Grandfather     brain  . Heart attack Paternal Grandfather    Allergies  Allergen Reactions  . Neosporin (Neomycin-Polymyxin-Gramicidin) Other (See Comments)    Patient states that this  medication causes a poison ivy rash like reaction.  . Vancomycin Other (See Comments)    Patient states that when this medication is given intravenously, it causes her kidney's to shut down.   Current Outpatient Prescriptions on File Prior to Visit  Medication Sig Dispense Refill  . acetaminophen (TYLENOL) 500 MG tablet Take 500 mg by mouth every 6 (six) hours as needed. Patient took this medication for pain.      . fluticasone (FLONASE) 50 MCG/ACT nasal spray Place 2 sprays into the nose daily.  16 g  12   No current facility-administered medications on file prior to visit.      Review of Systems Review of Systems  Constitutional: Negative for fever, appetite change, fatigue and unexpected weight change.  Eyes: Negative for pain and visual disturbance.  ENT pos for ST , neg for ear pain or sinus pain  Respiratory: Negative for cough and shortness of breath.   Cardiovascular: Negative for cp or palpitations    Gastrointestinal: Negative for nausea, diarrhea and constipation.  Genitourinary: Negative for urgency and frequency.  Skin: Negative for pallor or rash   Neurological: Negative for weakness, light-headedness, numbness and headaches.  Hematological: Negative for adenopathy.  Does not bruise/bleed easily.  Psychiatric/Behavioral: Negative for dysphoric mood. The patient is not nervous/anxious.         Objective:   Physical Exam  Constitutional: She appears well-developed and well-nourished. No distress.  HENT:  Head: Normocephalic and atraumatic.  Right Ear: External ear normal.  Left Ear: External ear normal.  Mouth/Throat: No oropharyngeal exudate.  Diffuse erythema of throat without swelling or exudate Nares are boggy  Eyes: Conjunctivae and EOM are normal. Pupils are equal, round, and reactive to light. Right eye exhibits no discharge. Left eye exhibits no discharge. No scleral icterus.  Neck: Normal range of motion. Neck supple.  Cardiovascular: Normal rate and  regular rhythm.   Pulmonary/Chest: Effort normal and breath sounds normal. No respiratory distress. She has no wheezes. She has no rales.  Lymphadenopathy:    She has no cervical adenopathy.  Neurological: She is alert.  Skin: Skin is warm and dry. No rash noted. No erythema.  Psychiatric: She has a normal mood and affect.          Assessment & Plan:

## 2013-03-05 NOTE — Patient Instructions (Addendum)
Drink lots of fluids  Tylenol or ibuprofen for pain as needed  Chloraseptic throat spray helps pain  Salt water gargle also  If worse or not improving - call or follow up

## 2013-03-07 ENCOUNTER — Telehealth: Payer: Self-pay

## 2013-03-07 NOTE — Telephone Encounter (Signed)
Triage Record Num: [id number] Operator: April Finney Patient Name: Meredith Castillo Call Date & Time: 03/05/2013 9:05:36AM Patient Phone: [phone] PCP: Patient Gender: Female PCP Fax : Patient DOB: 02-20-1981 Practice Name: [facility] Reason for Call: Caller: Jeriah/Patient; PCP: Hannah Beat (Family Practice); CB#: [phone]; Call regarding Sore Throat; Has been treated for strep throat since 03/01/2013 and now having pain on one side of throat. Appt scheduled for today at 11:45am in epic for [place] office. Protocol(s) Used: Office Note Recommended Outcome per Protocol: Information Noted and Sent to Office Reason for Outcome: Caller information to office Care Advice: ~

## 2013-03-23 ENCOUNTER — Telehealth: Payer: Self-pay

## 2013-03-23 MED ORDER — CEFDINIR 300 MG PO CAPS: 300.0000 mg | ORAL_CAPSULE | Freq: Two times a day (BID) | ORAL | Status: DC

## 2013-03-23 NOTE — Telephone Encounter (Signed)
Call in  omnicef 300 mg, 2 tabs po daily x 10 days, #20, 0 ref

## 2013-03-23 NOTE — Telephone Encounter (Signed)
Pt seen x 2 recently for S/T; pt has taken amoxicillin,S/T reoccured finished Augmentin. S/T went away. S/t began again on 03/15/13. Now painful when swallows on rt side especially when tries to eat. No fever, no rash and no cough now.Pt is not using salt water gargle now because it burns throat and pt has not used Chloraseptic. No available appts; pt going out of town on 03/26/13 for 1 week. [facility].Please advise. Pt request cb.

## 2013-03-23 NOTE — Telephone Encounter (Signed)
rx sent to pharmacy and patient advised. 

## 2013-05-06 ENCOUNTER — Other Ambulatory Visit (HOSPITAL_BASED_OUTPATIENT_CLINIC_OR_DEPARTMENT_OTHER): Payer: BC Managed Care – PPO

## 2013-05-06 DIAGNOSIS — D509 Iron deficiency anemia, unspecified: Secondary | ICD-10-CM

## 2013-05-06 LAB — CBC WITH DIFFERENTIAL/PLATELET
BASO%: 0.6 % (ref 0.0–2.0)
Basophils Absolute: 0.1 10*3/uL (ref 0.0–0.1)
EOS%: 3.7 % (ref 0.0–7.0)
HGB: 13.2 g/dL (ref 11.6–15.9)
MCH: 29.1 pg (ref 25.1–34.0)
MCV: 86.6 fL (ref 79.5–101.0)
MONO%: 5.8 % (ref 0.0–14.0)
RBC: 4.53 10*6/uL (ref 3.70–5.45)
RDW: 13.6 % (ref 11.2–14.5)
lymph#: 2.5 10*3/uL (ref 0.9–3.3)

## 2013-05-09 ENCOUNTER — Telehealth: Payer: Self-pay | Admitting: *Deleted

## 2013-05-09 LAB — FERRITIN CHCC: Ferritin: 14 ng/ml (ref 9–269)

## 2013-05-09 NOTE — Telephone Encounter (Signed)
Message copied by Phillis Knack on Mon May 09, 2013  3:03 PM ------      Message from: Kallie Locks      Created: Mon May 09, 2013  2:48 PM                   ----- Message -----         From: Myrtis Ser, NP         Sent: 05/09/2013   2:17 PM           To: Marcell Barlow, RN            Please call pt. Hgb and Ferritin are normal. Continue oral iron as already taking. ------

## 2013-05-09 NOTE — Telephone Encounter (Signed)
Left message on patient's voice mail with results and note below. To call us if she has any questions

## 2013-06-13 ENCOUNTER — Encounter (HOSPITAL_COMMUNITY): Payer: Self-pay | Admitting: *Deleted

## 2013-06-13 DIAGNOSIS — Z87891 Personal history of nicotine dependence: Secondary | ICD-10-CM | POA: Insufficient documentation

## 2013-06-13 DIAGNOSIS — D509 Iron deficiency anemia, unspecified: Secondary | ICD-10-CM | POA: Insufficient documentation

## 2013-06-13 DIAGNOSIS — I10 Essential (primary) hypertension: Secondary | ICD-10-CM | POA: Insufficient documentation

## 2013-06-13 DIAGNOSIS — Z9884 Bariatric surgery status: Secondary | ICD-10-CM | POA: Insufficient documentation

## 2013-06-13 DIAGNOSIS — Z8742 Personal history of other diseases of the female genital tract: Secondary | ICD-10-CM | POA: Insufficient documentation

## 2013-06-13 DIAGNOSIS — Z3202 Encounter for pregnancy test, result negative: Secondary | ICD-10-CM | POA: Insufficient documentation

## 2013-06-13 DIAGNOSIS — R197 Diarrhea, unspecified: Secondary | ICD-10-CM | POA: Insufficient documentation

## 2013-06-13 DIAGNOSIS — Z888 Allergy status to other drugs, medicaments and biological substances status: Secondary | ICD-10-CM | POA: Insufficient documentation

## 2013-06-13 DIAGNOSIS — J45909 Unspecified asthma, uncomplicated: Secondary | ICD-10-CM | POA: Insufficient documentation

## 2013-06-13 DIAGNOSIS — Z79899 Other long term (current) drug therapy: Secondary | ICD-10-CM | POA: Insufficient documentation

## 2013-06-13 DIAGNOSIS — Z6841 Body Mass Index (BMI) 40.0 and over, adult: Secondary | ICD-10-CM | POA: Insufficient documentation

## 2013-06-13 DIAGNOSIS — Z881 Allergy status to other antibiotic agents status: Secondary | ICD-10-CM | POA: Insufficient documentation

## 2013-06-13 DIAGNOSIS — E559 Vitamin D deficiency, unspecified: Secondary | ICD-10-CM | POA: Insufficient documentation

## 2013-06-13 DIAGNOSIS — E538 Deficiency of other specified B group vitamins: Secondary | ICD-10-CM | POA: Insufficient documentation

## 2013-06-13 DIAGNOSIS — R109 Unspecified abdominal pain: Secondary | ICD-10-CM | POA: Insufficient documentation

## 2013-06-13 LAB — CBC WITH DIFFERENTIAL/PLATELET
Basophils Relative: 0 % (ref 0–1)
Hemoglobin: 14.5 g/dL (ref 12.0–15.0)
MCHC: 35.1 g/dL (ref 30.0–36.0)
Monocytes Relative: 5 % (ref 3–12)
Neutro Abs: 5.8 10*3/uL (ref 1.7–7.7)
Neutrophils Relative %: 58 % (ref 43–77)
Platelets: 249 10*3/uL (ref 150–400)
RBC: 4.8 MIL/uL (ref 3.87–5.11)

## 2013-06-13 LAB — COMPREHENSIVE METABOLIC PANEL
ALT: 22 U/L (ref 0–35)
AST: 20 U/L (ref 0–37)
Albumin: 4.8 g/dL (ref 3.5–5.2)
Alkaline Phosphatase: 80 U/L (ref 39–117)
BUN: 10 mg/dL (ref 6–23)
Chloride: 103 mEq/L (ref 96–112)
Potassium: 4.2 mEq/L (ref 3.5–5.1)
Sodium: 140 mEq/L (ref 135–145)
Total Bilirubin: 0.4 mg/dL (ref 0.3–1.2)

## 2013-06-13 LAB — URINALYSIS, ROUTINE W REFLEX MICROSCOPIC
Ketones, ur: NEGATIVE mg/dL
Leukocytes, UA: NEGATIVE
Nitrite: NEGATIVE
Specific Gravity, Urine: 1.027 (ref 1.005–1.030)
Urobilinogen, UA: 1 mg/dL (ref 0.0–1.0)
pH: 5.5 (ref 5.0–8.0)

## 2013-06-13 LAB — POCT PREGNANCY, URINE: Preg Test, Ur: NEGATIVE

## 2013-06-13 NOTE — ED Notes (Signed)
Pt reports loose stools that began Friday evening, pt has been experiencing lower abd "soreness" - today pain has gotten progressively worse and admits to some lower back pain. Pt denies n/v, fever, dysuria, or vaginal discharge.

## 2013-06-14 ENCOUNTER — Encounter (HOSPITAL_COMMUNITY): Payer: Self-pay | Admitting: Radiology

## 2013-06-14 ENCOUNTER — Emergency Department (HOSPITAL_COMMUNITY)
Admission: EM | Admit: 2013-06-14 | Discharge: 2013-06-14 | Disposition: A | Discharge status: Home / Self Care | Payer: BC Managed Care – PPO | Attending: Emergency Medicine | Admitting: Emergency Medicine

## 2013-06-14 ENCOUNTER — Emergency Department (HOSPITAL_COMMUNITY): Payer: BC Managed Care – PPO

## 2013-06-14 DIAGNOSIS — R109 Unspecified abdominal pain: Secondary | ICD-10-CM

## 2013-06-14 DIAGNOSIS — R197 Diarrhea, unspecified: Secondary | ICD-10-CM

## 2013-06-14 MED ORDER — SODIUM CHLORIDE 0.9 % IV BOLUS (SEPSIS)
1000.0000 mL | Freq: Once | INTRAVENOUS | Status: AC
  Administered 2013-06-14: 1000 mL via INTRAVENOUS

## 2013-06-14 MED ORDER — ONDANSETRON HCL 4 MG/2ML IJ SOLN
4.0000 mg | Freq: Once | INTRAMUSCULAR | Status: AC
  Administered 2013-06-14: 4 mg via INTRAVENOUS
  Filled 2013-06-14: qty 2

## 2013-06-14 MED ORDER — MORPHINE SULFATE 4 MG/ML IJ SOLN
4.0000 mg | Freq: Once | INTRAMUSCULAR | Status: AC
  Administered 2013-06-14: 4 mg via INTRAVENOUS
  Filled 2013-06-14: qty 1

## 2013-06-14 MED ORDER — IOHEXOL 300 MG/ML  SOLN
100.0000 mL | Freq: Once | INTRAMUSCULAR | Status: AC | PRN
  Administered 2013-06-14: 100 mL via INTRAVENOUS

## 2013-06-14 NOTE — ED Provider Notes (Signed)
CSN: [id number]     Arrival date & time 06/13/13  2057 History   First MD Initiated Contact with Patient 06/14/13 0111     Chief Complaint  Patient presents with  . Abdominal Pain  . Diarrhea   (Consider location/radiation/quality/duration/timing/severity/associated sxs/prior Treatment) HPI Comments: Patient is a 32 year old female with past medical history significant for gastric bypass. She presents today with complaints of diarrhea that began 3 days ago and has been worsening since that time. She is now experiencing significant lower abdominal cramping that concerns her. She denies any bloody stool and she denies any vomiting. There is no fever. She denies any ill contacts.  Patient is a 32 y.o. female presenting with abdominal pain and diarrhea. The history is provided by the patient.  Abdominal Pain Pain location:  LLQ, RLQ and suprapubic Pain quality: cramping   Pain radiates to:  Does not radiate Pain severity:  Moderate Onset quality:  Gradual Duration:  3 days Timing:  Intermittent Progression:  Worsening Chronicity:  New Relieved by:  Nothing Worsened by:  Nothing tried Ineffective treatments:  None tried Associated symptoms: diarrhea   Diarrhea Associated symptoms: abdominal pain     Past Medical History  Diagnosis Date  . Bariatric surgery status complicating pregnancy, childbirth, or the puerperium, antepartum condition or complication 2008  . Unspecified vitamin D deficiency   . Morbid obesity with BMI of 40.0-44.9, adult   . Asthma   . PP care - s/p SVD 2/19 11/19/2011  . Iron deficiency anemia     due to gastric bypass.   Marland Kitchen HTN (hypertension)   . PCOS (polycystic ovarian syndrome)   . Vitamin B12 deficiency     due to gastric bypass.    Past Surgical History  Procedure Laterality Date  . Gastric bypass  2008    Rou-en-Y  . Appendectomy  2005  . Wisdom tooth extraction    . Nasal septum surgery  1996  . Gynecologic surgery  2010    HSG   Family  History  Problem Relation Age of Onset  . Anesthesia problems Neg Hx   . Hypertension Mother   . Diabetes Mother   . Heart disease Mother   . Cancer Maternal Grandfather     brain  . Heart attack Paternal Grandfather    History  Substance Use Topics  . Smoking status: Former Smoker -- 0.25 packs/day    Quit date: 10/12/2005  . Smokeless tobacco: Never Used  . Alcohol Use: No   OB History   Grav Para Term Preterm Abortions TAB SAB Ect Mult Living   2 2 2  0      2     Review of Systems  Gastrointestinal: Positive for abdominal pain and diarrhea.  All other systems reviewed and are negative.    Allergies  Neosporin and Vancomycin  Home Medications   Current Outpatient Rx  Name  Route  Sig  Dispense  Refill  . acetaminophen (TYLENOL) 500 MG tablet   Oral   Take 500 mg by mouth every 6 (six) hours as needed. Patient took this medication for pain.         . fluticasone (FLONASE) 50 MCG/ACT nasal spray   Each Nare   Place 1 spray into both nostrils daily as needed for allergies.         Marland Kitchen levonorgestrel (MIRENA) 20 MCG/24HR IUD   Intrauterine   1 each by Intrauterine route once.         Marland Kitchen PARoxetine (PAXIL)  10 MG tablet   Oral   Take 10 mg by mouth every morning.         . traMADol (ULTRAM) 50 MG tablet   Oral   Take 50 mg by mouth every 6 (six) hours as needed for pain.         . Vitamin D, Ergocalciferol, (DRISDOL) 50000 UNITS CAPS capsule   Oral   Take 50,000 Units by mouth every 7 (seven) days.         . Multiple Vitamins-Minerals (MULTIVITAMIN WITH MINERALS) tablet   Oral   Take 1 tablet by mouth daily.          BP 133/77  Pulse 68  Temp(Src) 98.7 F (37.1 C) (Oral)  Resp 18  Ht 5\' 8"  (1.727 m)  Wt 263 lb (119.296 kg)  BMI 40 kg/m2  SpO2 100%  LMP 05/23/2013 Physical Exam  Nursing note and vitals reviewed. Constitutional: She is oriented to person, place, and time. She appears well-developed and well-nourished. No distress.   HENT:  Head: Normocephalic and atraumatic.  Neck: Normal range of motion. Neck supple.  Cardiovascular: Normal rate and regular rhythm.  Exam reveals no gallop and no friction rub.   No murmur heard. Pulmonary/Chest: Effort normal and breath sounds normal. No respiratory distress. She has no wheezes.  Abdominal: Soft. Bowel sounds are normal. She exhibits no distension. There is tenderness.  There is tenderness to palpation in the left lower and right lower quadrants. There is no rebound and no guarding. Bowel sounds are present.  Musculoskeletal: Normal range of motion.  Neurological: She is alert and oriented to person, place, and time.  Skin: Skin is warm and dry. She is not diaphoretic.    ED Course  Procedures (including critical care time) Labs Review Labs Reviewed  URINALYSIS, ROUTINE W REFLEX MICROSCOPIC - Abnormal; Notable for the following:    Bilirubin Urine SMALL (*)    All other components within normal limits  CBC WITH DIFFERENTIAL  COMPREHENSIVE METABOLIC PANEL  POCT PREGNANCY, URINE   Imaging Review No results found.  MDM  No diagnosis found. CT of the abdomen and pelvis is unremarkable. Abdomen was reexamined and it continues to be benign. Laboratory studies are unremarkable and I suspect this is likely a viral etiology. She'll be discharged to home with fluids as tolerated and when necessary followup    Geoffery Lyons, MD 06/14/13 [phone]

## 2013-06-14 NOTE — ED Notes (Signed)
Pts states on Friday evening she began having loose stools and Saturday morning the stools were very watery. Pt states since then she has had a very sore abdomen and loose stools. Around 6p tonight she began having increased pain.

## 2013-09-02 ENCOUNTER — Other Ambulatory Visit: Payer: Self-pay | Admitting: Hematology and Oncology

## 2013-09-02 DIAGNOSIS — Z9884 Bariatric surgery status: Secondary | ICD-10-CM

## 2013-09-05 ENCOUNTER — Other Ambulatory Visit (HOSPITAL_BASED_OUTPATIENT_CLINIC_OR_DEPARTMENT_OTHER): Payer: BC Managed Care – PPO | Admitting: Lab

## 2013-09-05 ENCOUNTER — Telehealth: Payer: Self-pay | Admitting: Hematology and Oncology

## 2013-09-05 DIAGNOSIS — Z9884 Bariatric surgery status: Secondary | ICD-10-CM

## 2013-09-05 DIAGNOSIS — D509 Iron deficiency anemia, unspecified: Secondary | ICD-10-CM

## 2013-09-05 LAB — CBC & DIFF AND RETIC
Basophils Absolute: 0 10*3/uL (ref 0.0–0.1)
EOS%: 4.3 % (ref 0.0–7.0)
Eosinophils Absolute: 0.4 10*3/uL (ref 0.0–0.5)
HCT: 40.1 % (ref 34.8–46.6)
HGB: 13.1 g/dL (ref 11.6–15.9)
MCH: 29 pg (ref 25.1–34.0)
MCV: 88.7 fL (ref 79.5–101.0)
MONO%: 6.2 % (ref 0.0–14.0)
NEUT#: 5.3 10*3/uL (ref 1.5–6.5)
NEUT%: 59.2 % (ref 38.4–76.8)
Platelets: 257 10*3/uL (ref 145–400)

## 2013-09-05 NOTE — Telephone Encounter (Signed)
lmonvm for pt re lb/fu 03/03/14. pt already came in for lb today. schedule mailed.

## 2013-09-24 ENCOUNTER — Encounter: Payer: Self-pay | Admitting: Internal Medicine

## 2013-09-24 ENCOUNTER — Ambulatory Visit (INDEPENDENT_AMBULATORY_CARE_PROVIDER_SITE_OTHER): Payer: BC Managed Care – PPO | Admitting: Internal Medicine

## 2013-09-24 VITALS — BP 122/74 | HR 88 | Temp 98.3°F | Ht 68.0 in | Wt 270.0 lb

## 2013-09-24 DIAGNOSIS — J019 Acute sinusitis, unspecified: Secondary | ICD-10-CM | POA: Insufficient documentation

## 2013-09-24 MED ORDER — LEVOFLOXACIN 500 MG PO TABS: 500.0000 mg | ORAL_TABLET | Freq: Every day | ORAL | Status: DC

## 2013-09-24 NOTE — Assessment & Plan Note (Signed)
Mild to mod, for antibx course,  to f/u any worsening symptoms or concerns 

## 2013-09-24 NOTE — Patient Instructions (Signed)
Please take all new medication as prescribed - the antibiotic  You can also take Delsym OTC for cough, and/or Mucinex (or it's generic off brand) for congestion, and tylenol as needed for pain.  Please remember to sign up for My Chart if you have not done so, as this will be important to you in the future with finding out test results, communicating by private email, and scheduling acute appointments online when needed.    

## 2013-09-24 NOTE — Progress Notes (Signed)
Pre-visit discussion using our clinic review tool. No additional management support is needed unless otherwise documented below in the visit note.  

## 2013-09-24 NOTE — Progress Notes (Signed)
Subjective:    Patient ID: Meredith Castillo, female    DOB: [birth year], 32 y.o.   MRN: [id number]  HPI  Here with 2-3 days acute onset fever, facial pain, pressure, headache, general weakness and malaise, and greenish d/c, with mild ST and cough, but pt denies chest pain, wheezing, increased sob or doe, orthopnea, PND, increased LE swelling, palpitations, dizziness or syncope. Past Medical History  Diagnosis Date  . Bariatric surgery status complicating pregnancy, childbirth, or the puerperium, antepartum condition or complication 2008  . Unspecified vitamin D deficiency   . Morbid obesity with BMI of 40.0-44.9, adult   . Asthma   . PP care - s/p SVD 2/19 11/19/2011  . Iron deficiency anemia     due to gastric bypass.   Marland Kitchen HTN (hypertension)   . PCOS (polycystic ovarian syndrome)   . Vitamin B12 deficiency     due to gastric bypass.    Past Surgical History  Procedure Laterality Date  . Gastric bypass  2008    Rou-en-Y  . Appendectomy  2005  . Wisdom tooth extraction    . Nasal septum surgery  1996  . Gynecologic surgery  2010    HSG    reports that she quit smoking about 7 years ago. She has never used smokeless tobacco. She reports that she does not drink alcohol or use illicit drugs. family history includes Cancer in her maternal grandfather; Diabetes in her mother; Heart attack in her paternal grandfather; Heart disease in her mother; Hypertension in her mother. There is no history of Anesthesia problems. Allergies  Allergen Reactions  . Neosporin [Neomycin-Polymyxin-Gramicidin] Other (See Comments)    Patient states that this medication causes a poison ivy rash like reaction.  . Vancomycin Other (See Comments)    Patient states that when this medication is given intravenously, it causes her kidney's to shut down.   Current Outpatient Prescriptions on File Prior to Visit  Medication Sig Dispense Refill  . acetaminophen (TYLENOL) 500 MG tablet Take 500 mg by mouth every 6  (six) hours as needed. Patient took this medication for pain.      . fluticasone (FLONASE) 50 MCG/ACT nasal spray Place 1 spray into both nostrils daily as needed for allergies.      Marland Kitchen levonorgestrel (MIRENA) 20 MCG/24HR IUD 1 each by Intrauterine route once.      . Multiple Vitamins-Minerals (MULTIVITAMIN WITH MINERALS) tablet Take 1 tablet by mouth daily.      Marland Kitchen PARoxetine (PAXIL) 10 MG tablet Take 10 mg by mouth every morning.      . traMADol (ULTRAM) 50 MG tablet Take 50 mg by mouth every 6 (six) hours as needed for pain.      . Vitamin D, Ergocalciferol, (DRISDOL) 50000 UNITS CAPS capsule Take 50,000 Units by mouth every 7 (seven) days.       No current facility-administered medications on file prior to visit.    Review of Systems All otherwise neg per pt     Objective:   Physical Exam BP 122/74  Pulse 88  Temp(Src) 98.3 F (36.8 C) (Oral)  Ht 5\' 8"  (1.727 m)  Wt 270 lb (122.471 kg)  BMI 41.06 kg/m2  SpO2 97% VS noted,  Constitutional: Pt appears well-developed and well-nourished.  HENT: Head: NCAT.  Right Ear: External ear normal.  Left Ear: External ear normal.  Bilat tm's with mild erythema.  Max sinus areas mild tender.  Pharynx with mild erythema, no exudate Eyes: Conjunctivae and EOM are  normal. Pupils are equal, round, and reactive to light.  Neck: Normal range of motion. Neck supple.  Cardiovascular: Normal rate and regular rhythm.   Pulmonary/Chest: Effort normal and breath sounds normal.  - no rales or wheezing Neurological: Pt is alert. Not confused  Skin: Skin is warm. No erythema.  Psychiatric: Pt behavior is normal. Thought content normal.     Assessment & Plan:

## 2013-09-26 ENCOUNTER — Telehealth: Payer: Self-pay | Admitting: Family Medicine

## 2013-09-26 NOTE — Telephone Encounter (Signed)
Confidential Office Message [address] [place], [place] [address] p. [phone] f. [phone] To: Gar Gibbon (After Hours Triage) Fax: [phone] From: [facility] Date/ Time: 09/24/2013 12:10 AM Taken By: Alphonsa Overall, RN Caller: Darl Pikes Facility: Not Collected Patient: Meredith Castillo, Meredith Castillo DOB: 08/10/1981 Phone: [phone] Reason for Call: Caller was unable to be reached on callback - Left Message Regarding Appointment: No Appt Date: Appt Time: Unknown Provider: Reason: Details: Outcome: Confidential

## 2014-01-06 ENCOUNTER — Ambulatory Visit: Payer: BC Managed Care – PPO | Admitting: Oncology

## 2014-01-06 ENCOUNTER — Other Ambulatory Visit: Payer: BC Managed Care – PPO | Admitting: Lab

## 2014-01-09 ENCOUNTER — Ambulatory Visit (INDEPENDENT_AMBULATORY_CARE_PROVIDER_SITE_OTHER): Payer: BC Managed Care – PPO | Admitting: Family Medicine

## 2014-01-09 ENCOUNTER — Encounter: Payer: Self-pay | Admitting: Family Medicine

## 2014-01-09 VITALS — BP 126/88 | HR 99 | Temp 98.7°F | Ht 68.0 in | Wt 280.5 lb

## 2014-01-09 DIAGNOSIS — J45901 Unspecified asthma with (acute) exacerbation: Secondary | ICD-10-CM

## 2014-01-09 DIAGNOSIS — J189 Pneumonia, unspecified organism: Secondary | ICD-10-CM

## 2014-01-09 MED ORDER — DEXAMETHASONE SODIUM PHOSPHATE 10 MG/ML IJ SOLN
10.0000 mg | Freq: Once | INTRAMUSCULAR | Status: AC
  Administered 2014-01-09: 10 mg via INTRAMUSCULAR

## 2014-01-09 MED ORDER — ALBUTEROL SULFATE HFA 108 (90 BASE) MCG/ACT IN AERS: 2.0000 | INHALATION_SPRAY | RESPIRATORY_TRACT | Status: DC | PRN

## 2014-01-09 MED ORDER — DOXYCYCLINE HYCLATE 100 MG PO TABS: 100.0000 mg | ORAL_TABLET | Freq: Two times a day (BID) | ORAL | Status: DC

## 2014-01-09 NOTE — Progress Notes (Signed)
Date:  01/09/2014   Name:  Meredith Castillo   DOB:  Oct 22, 1980   MRN:  [id number] Gender: female Age: 33 y.o.  Primary Physician:  Owens Loffler, MD   Subjective:   History of Present Illness:  Meredith Castillo is a 33 y.o. very pleasant female patient who presents with the following:  H/o mild asthma, now with worsening SOB and acute infection.  Allergy issues and has been taking some zyrtec for it. Friday woke up and was coughing and having a lot of asthma / wheezing. Thought maybe settling down in some in her chest. Started to sound some hoarse yesterday and every breath felt really pretty raw. Sat was coughing up some green chunks and it was all day long. Started some mucinex and it was breaking it up more. Not coming up at all today.   Hard to breath.   Allergy related inhaler.  Asthma.   Past Medical History, Surgical History, Social History, Family History, Problem List, Medications, and Allergies have been reviewed and updated if relevant.  Review of Systems: ROS: GEN: Acute illness details above GI: Tolerating PO intake GU: maintaining adequate hydration and urination Pulm: No SOB Interactive and getting along well at home.  Otherwise, ROS is as per the HPI.   Objective:   Physical Examination: BP 126/88  Pulse 99  Temp(Src) 98.7 F (37.1 C) (Oral)  Ht 5\' 8"  (1.727 m)  Wt 280 lb 8 oz (127.234 kg)  BMI 42.66 kg/m2  LMP 01/08/2014   GEN: A and O x 3. WDWN. NAD.    ENT: Nose clear, ext NML.  No LAD.  No JVD.  TM's clear. Oropharynx clear.  PULM: Normal WOB, no distress. No crackles, wheezes, rhonchi. Decreased airflow B. CV: RRR, no M/G/R, No rubs, No JVD.   EXT: warm and well-perfused, No c/c/e. PSYCH: Pleasant and conversant.    Laboratory and Imaging Data:  Assessment & Plan:   Asthma with acute exacerbation - Plan: dexamethasone (DECADRON) injection 10 mg  Walking pneumonia  Decadron 10 mg IM Alb Abx  Follow-up: No Follow-up on  file.  New Prescriptions   ALBUTEROL (PROVENTIL HFA;VENTOLIN HFA) 108 (90 BASE) MCG/ACT INHALER    Inhale 2 puffs into the lungs every 4 (four) hours as needed for wheezing or shortness of breath.   DOXYCYCLINE (VIBRA-TABS) 100 MG TABLET    Take 1 tablet (100 mg total) by mouth 2 (two) times daily.   Signed,  Maud Deed. Ikran Patman, MD, [place] [facility] [place] [address] Phone: [phone] Fax: [phone]  Patient's Medications  New Prescriptions   ALBUTEROL (PROVENTIL HFA;VENTOLIN HFA) 108 (90 BASE) MCG/ACT INHALER    Inhale 2 puffs into the lungs every 4 (four) hours as needed for wheezing or shortness of breath.   DOXYCYCLINE (VIBRA-TABS) 100 MG TABLET    Take 1 tablet (100 mg total) by mouth 2 (two) times daily.  Previous Medications   ACETAMINOPHEN (TYLENOL) 500 MG TABLET    Take 500 mg by mouth every 6 (six) hours as needed. Patient took this medication for pain.   CETIRIZINE HCL (ZYRTEC) 5 MG/5ML SYRP    Take 5 mg by mouth daily.   FLUTICASONE (FLONASE) 50 MCG/ACT NASAL SPRAY    Place 1 spray into both nostrils daily as needed for allergies.   GUAIFENESIN (MUCINEX PO)    Take 1 tablet by mouth every 4 (four) hours as needed.   LEVONORGESTREL (MIRENA)  20 MCG/24HR IUD    1 each by Intrauterine route once.   MULTIPLE VITAMINS-MINERALS (MULTIVITAMIN WITH MINERALS) TABLET    Take 1 tablet by mouth daily.   PAROXETINE (PAXIL) 10 MG TABLET    Take 10 mg by mouth every morning.  Modified Medications   No medications on file  Discontinued Medications   LEVOFLOXACIN (LEVAQUIN) 500 MG TABLET    Take 1 tablet (500 mg total) by mouth daily.   TRAMADOL (ULTRAM) 50 MG TABLET    Take 50 mg by mouth every 6 (six) hours as needed for pain.   VITAMIN D, ERGOCALCIFEROL, (DRISDOL) 50000 UNITS CAPS CAPSULE    Take 50,000 Units by mouth every 7 (seven) days.

## 2014-01-09 NOTE — Progress Notes (Signed)
Pre visit review using our clinic review tool, if applicable. No additional management support is needed unless otherwise documented below in the visit note. 

## 2014-01-10 ENCOUNTER — Encounter: Payer: Self-pay | Admitting: Family Medicine

## 2014-01-10 DIAGNOSIS — J45909 Unspecified asthma, uncomplicated: Secondary | ICD-10-CM

## 2014-01-10 HISTORY — DX: Unspecified asthma, uncomplicated: J45.909

## 2014-03-03 ENCOUNTER — Ambulatory Visit: Payer: BC Managed Care – PPO | Admitting: Hematology and Oncology

## 2014-03-03 ENCOUNTER — Other Ambulatory Visit: Payer: BC Managed Care – PPO

## 2014-06-09 ENCOUNTER — Ambulatory Visit (INDEPENDENT_AMBULATORY_CARE_PROVIDER_SITE_OTHER): Payer: BC Managed Care – PPO | Admitting: Family Medicine

## 2014-06-09 ENCOUNTER — Encounter: Payer: Self-pay | Admitting: Family Medicine

## 2014-06-09 VITALS — BP 126/82 | HR 82 | Temp 98.7°F | Wt 300.0 lb

## 2014-06-09 DIAGNOSIS — J029 Acute pharyngitis, unspecified: Secondary | ICD-10-CM

## 2014-06-09 DIAGNOSIS — J020 Streptococcal pharyngitis: Secondary | ICD-10-CM

## 2014-06-09 LAB — POCT RAPID STREP A (OFFICE): Rapid Strep A Screen: NEGATIVE

## 2014-06-09 MED ORDER — AMOXICILLIN 875 MG PO TABS: 875.0000 mg | ORAL_TABLET | Freq: Two times a day (BID) | ORAL | Status: AC

## 2014-06-09 MED ORDER — LIDOCAINE VISCOUS 2 % MT SOLN: 10.0000 mL | OROMUCOSAL | Status: DC | PRN

## 2014-06-09 NOTE — Patient Instructions (Signed)
Start the amoxil today . Drink plenty of fluids, take tylenol as needed, and gargle with warm salt water for your throat.  Use lidocaine if needed.  This should gradually improve.  Take care.  Let us know if you have other concerns.

## 2014-06-09 NOTE — Progress Notes (Signed)
Pre visit review using our clinic review tool, if applicable. No additional management support is needed unless otherwise documented below in the visit note.  Tuesday with ST.  Thought it was a cold initially, no other URI sx.  ST continues.  Pain swallowing. No fevers.  Pain talking.  Minimal cough as of today, and she attributed that to allergies and not the ST itself.  No rhinorrhea, no ear pain.    Meds, vitals, and allergies reviewed.   ROS: See HPI.  Otherwise, noncontributory.  GEN: nad, alert and oriented HEENT: mucous membranes moist, tm w/o erythema, nasal exam w/o erythema, clear discharge noted,  OP with cobblestoning and exudates noted.   NECK: supple w/tender LA CV: rrr.   PULM: ctab, no inc wob EXT: no edema SKIN: no acute rash   RST neg.

## 2014-06-11 DIAGNOSIS — J029 Acute pharyngitis, unspecified: Secondary | ICD-10-CM | POA: Insufficient documentation

## 2014-06-11 NOTE — Assessment & Plan Note (Signed)
Presumed RST neg was a false neg.  Has exudates, ST, LA and minimal cough.  D/w pt.  Would treat.  She agrees.  Amoxil in the meantime. Lidocaine prn.  F/u prn.

## 2014-06-20 ENCOUNTER — Encounter: Payer: Self-pay | Admitting: Internal Medicine

## 2014-06-20 ENCOUNTER — Ambulatory Visit (INDEPENDENT_AMBULATORY_CARE_PROVIDER_SITE_OTHER): Payer: BC Managed Care – PPO | Admitting: Internal Medicine

## 2014-06-20 VITALS — BP 124/76 | HR 79 | Temp 98.3°F | Wt 303.0 lb

## 2014-06-20 DIAGNOSIS — R059 Cough, unspecified: Secondary | ICD-10-CM

## 2014-06-20 DIAGNOSIS — R05 Cough: Secondary | ICD-10-CM

## 2014-06-20 MED ORDER — HYDROCODONE-HOMATROPINE 5-1.5 MG/5ML PO SYRP: 5.0000 mL | ORAL_SOLUTION | Freq: Three times a day (TID) | ORAL | Status: DC | PRN

## 2014-06-20 MED ORDER — BENZONATATE 200 MG PO CAPS: 200.0000 mg | ORAL_CAPSULE | Freq: Three times a day (TID) | ORAL | Status: DC | PRN

## 2014-06-20 MED ORDER — METHYLPREDNISOLONE ACETATE 40 MG/ML IJ SUSP
80.0000 mg | Freq: Once | INTRAMUSCULAR | Status: AC
  Administered 2014-06-20: 80 mg via INTRAMUSCULAR

## 2014-06-20 NOTE — Progress Notes (Signed)
Subjective:    Patient ID: Meredith Castillo, female    DOB: 1981-09-07, 33 y.o.   MRN: [id number]  HPI  Patient presents with sore throat, productive cough yeloow sputum, chest congestion and ear pain for the past three days. She was seen in this office on 06/09/2014 and was treated with amoxicillin for 10 days.  She had a negative RST at that time but had exudates, ST and minimal cough. She reports that she finished the abx and started to feel better and then started with the current symptoms.  She has taken zylacam, tessalon and tussionex with minimal relief, she is still using flonase and zyrtec daily.    Review of Systems  Past Medical History  Diagnosis Date  . Bariatric surgery status complicating pregnancy, childbirth, or the puerperium, antepartum condition or complication 8786  . Unspecified vitamin D deficiency   . Morbid obesity with BMI of 40.0-44.9, adult   . Asthma   . PP care - s/p SVD 2/19 11/19/2011  . Iron deficiency anemia     due to gastric bypass.   Marland Kitchen HTN (hypertension)   . PCOS (polycystic ovarian syndrome)   . Vitamin B12 deficiency     due to gastric bypass.   . Mild asthma 01/10/2014    Current Outpatient Prescriptions  Medication Sig Dispense Refill  . acetaminophen (TYLENOL) 500 MG tablet Take 500 mg by mouth every 6 (six) hours as needed. Patient took this medication for pain.      Marland Kitchen albuterol (PROVENTIL HFA;VENTOLIN HFA) 108 (90 BASE) MCG/ACT inhaler Inhale 2 puffs into the lungs every 4 (four) hours as needed for wheezing or shortness of breath.  1 Inhaler  3  . cetirizine HCl (ZYRTEC) 5 MG/5ML SYRP Take 5 mg by mouth daily.      . fluticasone (FLONASE) 50 MCG/ACT nasal spray Place 1 spray into both nostrils daily as needed for allergies.      . GuaiFENesin (MUCINEX PO) Take 1 tablet by mouth every 4 (four) hours as needed.      Marland Kitchen levonorgestrel (MIRENA) 20 MCG/24HR IUD 1 each by Intrauterine route once.      . lidocaine (XYLOCAINE) 2 % solution Use as  directed 10 mLs in the mouth or throat as needed for mouth pain.  100 mL  1  . Multiple Vitamins-Minerals (MULTIVITAMIN WITH MINERALS) tablet Take 1 tablet by mouth daily.      Marland Kitchen PARoxetine (PAXIL) 10 MG tablet Take 10 mg by mouth every morning.       No current facility-administered medications for this visit.    Allergies  Allergen Reactions  . Neosporin [Neomycin-Polymyxin-Gramicidin] Other (See Comments)    Patient states that this medication causes a poison ivy rash like reaction.  . Vancomycin Other (See Comments)    Patient states that when this medication is given intravenously, it causes her kidney's to shut down.    Family History  Problem Relation Age of Onset  . Anesthesia problems Neg Hx   . Hypertension Mother   . Diabetes Mother   . Heart disease Mother   . Cancer Maternal Grandfather     brain  . Heart attack Paternal Grandfather     History   Social History  . Marital Status: Single    Spouse Name: N/A    Number of Children: 2  . Years of Education: N/A   Occupational History  .      [employer] employee.    Social History Main Topics  .  Smoking status: Former Smoker -- 0.25 packs/day    Quit date: 10/12/2005  . Smokeless tobacco: Never Used  . Alcohol Use: No  . Drug Use: No  . Sexual Activity: Yes    Birth Control/ Protection: IUD   Other Topics Concern  . Not on file   Social History Narrative  . No narrative on file     Constitutional: Denies fever, malaise, fatigue, headache or abrupt weight changes.  HEENT: Denies eye pain, eye redness, ringing in the ears, wax buildup Respiratory: Denies difficulty breathing, shortness of breath,   Cardiovascular: Denies chest pain, chest tightness, palpitations or swelling in the hands or feet.   No other specific complaints in a complete review of systems (except as listed in HPI above).     Objective:   Physical Exam Filed Vitals:   06/20/14 1039  Weight: 303 lb (137.44 kg)     There  were no vitals taken for this visit. Wt Readings from Last 3 Encounters:  06/09/14 300 lb (136.079 kg)  01/09/14 280 lb 8 oz (127.234 kg)  09/24/13 270 lb (122.471 kg)    General: Appears their stated age, well developed, well nourished in NAD. HEENT:  Ears: Tm's gray and intact, normal light reflex; Nose: mucosa pink and moist, septum midline; Throat/Mouth: Erythematous Neck: . Neck supple,  tenderness noted, no lympadenopathy noted. Cardiovascular: Normal rate and rhythm. S1,S2 noted.  No murmur, rubs or gallops noted.Pulmonary/Chest: Normal effort and positive vesicular breath sounds. No respiratory distress. No wheezes, rales or ronchi noted.  BMET    Component Value Date/Time   NA 140 06/13/2013 2123   NA 140 01/05/2013 0939   K 4.2 06/13/2013 2123   K 4.7 01/05/2013 0939   CL 103 06/13/2013 2123   CL 106 01/05/2013 0939   CO2 27 06/13/2013 2123   CO2 27 01/05/2013 0939   GLUCOSE 95 06/13/2013 2123   GLUCOSE 75 01/05/2013 0939   BUN 10 06/13/2013 2123   BUN 11.7 01/05/2013 0939   CREATININE 0.66 06/13/2013 2123   CREATININE 0.8 01/05/2013 0939   CALCIUM 9.9 06/13/2013 2123   CALCIUM 9.4 01/05/2013 0939   GFRNONAA >90 06/13/2013 2123   GFRAA >90 06/13/2013 2123    Lipid Panel  No results found for this basename: chol, trig, hdl, cholhdl, vldl, ldlcalc    CBC    Component Value Date/Time   WBC 9.0 09/05/2013 1552   WBC 10.1 06/13/2013 2123   RBC 4.52 09/05/2013 1552   RBC 4.80 06/13/2013 2123   HGB 13.1 09/05/2013 1552   HGB 14.5 06/13/2013 2123   HCT 40.1 09/05/2013 1552   HCT 41.3 06/13/2013 2123   PLT 257 09/05/2013 1552   PLT 249 06/13/2013 2123   MCV 88.7 09/05/2013 1552   MCV 86.0 06/13/2013 2123   MCH 29.0 09/05/2013 1552   MCH 30.2 06/13/2013 2123   MCHC 32.7 09/05/2013 1552   MCHC 35.1 06/13/2013 2123   RDW 13.9 09/05/2013 1552   RDW 13.6 06/13/2013 2123   LYMPHSABS 2.7 09/05/2013 1552   LYMPHSABS 3.4 06/13/2013 2123   MONOABS 0.6 09/05/2013 1552   MONOABS 0.5 06/13/2013 2123   EOSABS 0.4  09/05/2013 1552   EOSABS 0.3 06/13/2013 2123   BASOSABS 0.0 09/05/2013 1552   BASOSABS 0.0 06/13/2013 2123    Hgb A1C No results found for this basename: HGBA1C         Assessment & Plan:  1. Cough  - benzonatate (TESSALON) 200 MG capsule; Take 1 capsule (  200 mg total) by mouth 3 (three) times daily as needed for cough.  Dispense: 20 capsule; Refill: 0 - HYDROcodone-homatropine (HYCODAN) 5-1.5 MG/5ML syrup; Take 5 mLs by mouth every 8 (eight) hours as needed for cough.  Dispense: 120 mL; Refill: 0 Depo-medro shot in office Please call if your symptoms worsen or if they do not improve

## 2014-06-20 NOTE — Progress Notes (Signed)
HPI  Pt presents to the clinic today with c/o sore throat, ear pain, cough and chest congestion. She reports this started 3 days ago. She was seen 11 days ago for similar symptoms. Was treated with Amoxicillin for possible strep. Her symptoms did seem to go away but have now returned. She has tried Zicam, tessalon and tussionex with minimal relief. She has continued to take flonase and zyrtec for her allergies. Her cough is productive of yellow mucous. The cough is worse at night. She denies fever, chills or body aches.  Review of Systems      Past Medical History  Diagnosis Date  . Bariatric surgery status complicating pregnancy, childbirth, or the puerperium, antepartum condition or complication 4081  . Unspecified vitamin D deficiency   . Morbid obesity with BMI of 40.0-44.9, adult   . Asthma   . PP care - s/p SVD 2/19 11/19/2011  . Iron deficiency anemia     due to gastric bypass.   Marland Kitchen HTN (hypertension)   . PCOS (polycystic ovarian syndrome)   . Vitamin B12 deficiency     due to gastric bypass.   . Mild asthma 01/10/2014    Family History  Problem Relation Age of Onset  . Anesthesia problems Neg Hx   . Hypertension Mother   . Diabetes Mother   . Heart disease Mother   . Cancer Maternal Grandfather     brain  . Heart attack Paternal Grandfather     History   Social History  . Marital Status: Single    Spouse Name: N/A    Number of Children: 2  . Years of Education: N/A   Occupational History  .      [employer] employee.    Social History Main Topics  . Smoking status: Former Smoker -- 0.25 packs/day    Quit date: 10/12/2005  . Smokeless tobacco: Never Used  . Alcohol Use: No  . Drug Use: No  . Sexual Activity: Yes    Birth Control/ Protection: IUD   Other Topics Concern  . Not on file   Social History Narrative  . No narrative on file    Allergies  Allergen Reactions  . Neosporin [Neomycin-Polymyxin-Gramicidin] Other (See Comments)    Patient states  that this medication causes a poison ivy rash like reaction.  . Vancomycin Other (See Comments)    Patient states that when this medication is given intravenously, it causes her kidney's to shut down.     Constitutional:  Denies headache, fatigue, fever or abrupt weight changes.  HEENT:  Positive ear pain and sore throat. Denies eye redness, eye pain, pressure behind the eyes, facial pain, nasal congestion,  ringing in the ears, wax buildup, runny nose or bloody nose. Respiratory: Positive cough. Denies difficulty breathing or shortness of breath.  Cardiovascular: Denies chest pain, chest tightness, palpitations or swelling in the hands or feet.   No other specific complaints in a complete review of systems (except as listed in HPI above).  Objective:   BP 124/76  Pulse 79  Temp(Src) 98.3 F (36.8 C) (Oral)  Wt 303 lb (137.44 kg)  SpO2 98%  Wt Readings from Last 3 Encounters:  06/20/14 303 lb (137.44 kg)  06/09/14 300 lb (136.079 kg)  01/09/14 280 lb 8 oz (127.234 kg)     General: Appears his stated age, well developed, well nourished in NAD. HEENT: Head: normal shape and size; Eyes: sclera white, no icterus, conjunctiva pink, PERRLA and EOMs intact; Ears: Tm's gray  and intact, normal light reflex; Nose: mucosa pink and moist, septum midline; Throat/Mouth:Teeth present, mucosa erythematous and moist, no exudate noted, no lesions or ulcerations noted.  Cardiovascular: Normal rate and rhythm. S1,S2 noted.  No murmur, rubs or gallops noted. No JVD or BLE edema. No carotid bruits noted. Pulmonary/Chest: Normal effort and positive vesicular breath sounds. No respiratory distress. No wheezes, rales or ronchi noted.      Assessment & Plan:   Sore throat, ? viral:  80 mg Depo IM today I don't think that we should repeat abx at this time Get some rest and drink plenty of water Do salt water gargles for the sore throat eRx for Tessalon pearls Rx for Hycodan cough syrup  RTC as  needed or if symptoms persist.

## 2014-06-20 NOTE — Patient Instructions (Signed)
Upper Respiratory Infection, Adult An upper respiratory infection (URI) is also sometimes known as the common cold. The upper respiratory tract includes the nose, sinuses, throat, trachea, and bronchi. Bronchi are the airways leading to the lungs. Most people improve within 1 week, but symptoms can last up to 2 weeks. A residual cough may last even longer.  CAUSES Many different viruses can infect the tissues lining the upper respiratory tract. The tissues become irritated and inflamed and often become very moist. Mucus production is also common. A cold is contagious. You can easily spread the virus to others by oral contact. This includes kissing, sharing a glass, coughing, or sneezing. Touching your mouth or nose and then touching a surface, which is then touched by another person, can also spread the virus. SYMPTOMS  Symptoms typically develop 1 to 3 days after you come in contact with a cold virus. Symptoms vary from person to person. They may include:  Runny nose.  Sneezing.  Nasal congestion.  Sinus irritation.  Sore throat.  Loss of voice (laryngitis).  Cough.  Fatigue.  Muscle aches.  Loss of appetite.  Headache.  Low-grade fever. DIAGNOSIS  You might diagnose your own cold based on familiar symptoms, since most people get a cold 2 to 3 times a year. Your caregiver can confirm this based on your exam. Most importantly, your caregiver can check that your symptoms are not due to another disease such as strep throat, sinusitis, pneumonia, asthma, or epiglottitis. Blood tests, throat tests, and X-rays are not necessary to diagnose a common cold, but they may sometimes be helpful in excluding other more serious diseases. Your caregiver will decide if any further tests are required. RISKS AND COMPLICATIONS  You may be at risk for a more severe case of the common cold if you smoke cigarettes, have chronic heart disease (such as heart failure) or lung disease (such as asthma), or if  you have a weakened immune system. The very young and very old are also at risk for more serious infections. Bacterial sinusitis, middle ear infections, and bacterial pneumonia can complicate the common cold. The common cold can worsen asthma and chronic obstructive pulmonary disease (COPD). Sometimes, these complications can require emergency medical care and may be life-threatening. PREVENTION  The best way to protect against getting a cold is to practice good hygiene. Avoid oral or hand contact with people with cold symptoms. Wash your hands often if contact occurs. There is no clear evidence that vitamin C, vitamin E, echinacea, or exercise reduces the chance of developing a cold. However, it is always recommended to get plenty of rest and practice good nutrition. TREATMENT  Treatment is directed at relieving symptoms. There is no cure. Antibiotics are not effective, because the infection is caused by a virus, not by bacteria. Treatment may include:  Increased fluid intake. Sports drinks offer valuable electrolytes, sugars, and fluids.  Breathing heated mist or steam (vaporizer or shower).  Eating chicken soup or other clear broths, and maintaining good nutrition.  Getting plenty of rest.  Using gargles or lozenges for comfort.  Controlling fevers with ibuprofen or acetaminophen as directed by your caregiver.  Increasing usage of your inhaler if you have asthma. Zinc gel and zinc lozenges, taken in the first 24 hours of the common cold, can shorten the duration and lessen the severity of symptoms. Pain medicines may help with fever, muscle aches, and throat pain. A variety of non-prescription medicines are available to treat congestion and runny nose. Your caregiver   can make recommendations and may suggest nasal or lung inhalers for other symptoms.  HOME CARE INSTRUCTIONS   Only take over-the-counter or prescription medicines for pain, discomfort, or fever as directed by your  caregiver.  Use a warm mist humidifier or inhale steam from a shower to increase air moisture. This may keep secretions moist and make it easier to breathe.  Drink enough water and fluids to keep your urine clear or pale yellow.  Rest as needed.  Return to work when your temperature has returned to normal or as your caregiver advises. You may need to stay home longer to avoid infecting others. You can also use a face mask and careful hand washing to prevent spread of the virus. SEEK MEDICAL CARE IF:   After the first few days, you feel you are getting worse rather than better.  You need your caregiver's advice about medicines to control symptoms.  You develop chills, worsening shortness of breath, or brown or red sputum. These may be signs of pneumonia.  You develop yellow or brown nasal discharge or pain in the face, especially when you bend forward. These may be signs of sinusitis.  You develop a fever, swollen neck glands, pain with swallowing, or white areas in the back of your throat. These may be signs of strep throat. SEEK IMMEDIATE MEDICAL CARE IF:   You have a fever.  You develop severe or persistent headache, ear pain, sinus pain, or chest pain.  You develop wheezing, a prolonged cough, cough up blood, or have a change in your usual mucus (if you have chronic lung disease).  You develop sore muscles or a stiff neck. Document Released: 03/11/2001 Document Revised: 12/08/2011 Document Reviewed: 12/21/2013 Exit[employer] Patient Information 2015 [employer]. This information is not intended to replace advice given to you by your health care provider. Make sure you discuss any questions you have with your health care provider.  

## 2014-06-20 NOTE — Addendum Note (Signed)
Addended by: Lurlean Nanny on: 06/20/2014 11:54 AM   Modules accepted: Orders

## 2014-06-20 NOTE — Progress Notes (Signed)
Pre visit review using our clinic review tool, if applicable. No additional management support is needed unless otherwise documented below in the visit note. 

## 2014-07-19 ENCOUNTER — Ambulatory Visit (INDEPENDENT_AMBULATORY_CARE_PROVIDER_SITE_OTHER): Payer: BC Managed Care – PPO

## 2014-07-19 DIAGNOSIS — Z23 Encounter for immunization: Secondary | ICD-10-CM

## 2014-07-31 ENCOUNTER — Encounter: Payer: Self-pay | Admitting: Internal Medicine

## 2014-09-14 ENCOUNTER — Ambulatory Visit (INDEPENDENT_AMBULATORY_CARE_PROVIDER_SITE_OTHER)
Admission: RE | Admit: 2014-09-14 | Discharge: 2014-09-14 | Disposition: A | Discharge status: Home / Self Care | Payer: BC Managed Care – PPO | Source: Ambulatory Visit | Attending: Internal Medicine | Admitting: Internal Medicine

## 2014-09-14 ENCOUNTER — Encounter: Payer: Self-pay | Admitting: Internal Medicine

## 2014-09-14 ENCOUNTER — Ambulatory Visit (INDEPENDENT_AMBULATORY_CARE_PROVIDER_SITE_OTHER): Payer: BC Managed Care – PPO | Admitting: Internal Medicine

## 2014-09-14 VITALS — BP 130/88 | HR 114 | Temp 98.3°F | Resp 16 | Ht 68.0 in | Wt 308.0 lb

## 2014-09-14 DIAGNOSIS — R059 Cough, unspecified: Secondary | ICD-10-CM

## 2014-09-14 DIAGNOSIS — J189 Pneumonia, unspecified organism: Secondary | ICD-10-CM

## 2014-09-14 DIAGNOSIS — R05 Cough: Secondary | ICD-10-CM

## 2014-09-14 MED ORDER — AZITHROMYCIN 500 MG PO TABS: 500.0000 mg | ORAL_TABLET | Freq: Every day | ORAL | Status: DC

## 2014-09-14 MED ORDER — HYDROCODONE-HOMATROPINE 5-1.5 MG/5ML PO SYRP: 5.0000 mL | ORAL_SOLUTION | Freq: Three times a day (TID) | ORAL | Status: DC | PRN

## 2014-09-14 NOTE — Progress Notes (Signed)
Subjective:    Patient ID: Meredith Castillo, female    DOB: 1980-10-11, 33 y.o.   MRN: [id number]  Cough This is a new problem. The current episode started in the past 7 days. The problem has been unchanged. The problem occurs every few hours. The cough is productive of purulent sputum. Associated symptoms include nasal congestion, postnasal drip, rhinorrhea and a sore throat. Pertinent negatives include no chest pain, chills, ear congestion, ear pain, fever, headaches, heartburn, hemoptysis, myalgias, rash, shortness of breath, sweats, weight loss or wheezing. She has tried OTC cough suppressant for the symptoms. The treatment provided mild relief. There is no history of asthma, bronchiectasis, bronchitis, COPD, emphysema, environmental allergies or pneumonia.      Review of Systems  Constitutional: Negative.  Negative for fever, chills, weight loss, diaphoresis, appetite change and fatigue.  HENT: Positive for congestion, postnasal drip, rhinorrhea, sinus pressure and sore throat. Negative for ear pain, facial swelling, sneezing and trouble swallowing.   Eyes: Negative.   Respiratory: Positive for cough. Negative for apnea, hemoptysis, choking, chest tightness, shortness of breath, wheezing and stridor.   Cardiovascular: Negative.  Negative for chest pain, palpitations and leg swelling.  Gastrointestinal: Negative.  Negative for heartburn and abdominal pain.  Endocrine: Negative.   Genitourinary: Negative.   Musculoskeletal: Negative.  Negative for myalgias and back pain.  Skin: Negative.  Negative for rash.  Allergic/Immunologic: Negative.  Negative for environmental allergies.  Neurological: Negative.  Negative for headaches.  Hematological: Negative.  Negative for adenopathy. Does not bruise/bleed easily.  Psychiatric/Behavioral: Negative.        Objective:   Physical Exam  Constitutional: She is oriented to person, place, and time. She appears well-developed and well-nourished.   Non-toxic appearance. She does not have a sickly appearance. She does not appear ill. No distress.  HENT:  Right Ear: Hearing, tympanic membrane, external ear and ear canal normal.  Left Ear: Hearing, tympanic membrane, external ear and ear canal normal.  Nose: No mucosal edema or rhinorrhea. Right sinus exhibits no maxillary sinus tenderness and no frontal sinus tenderness. Left sinus exhibits no maxillary sinus tenderness and no frontal sinus tenderness.  Mouth/Throat: Oropharynx is clear and moist and mucous membranes are normal. Mucous membranes are not pale, not dry and not cyanotic. No oral lesions. No trismus in the jaw. No uvula swelling. No oropharyngeal exudate, posterior oropharyngeal edema, posterior oropharyngeal erythema or tonsillar abscesses.  Eyes: Conjunctivae are normal. Right eye exhibits no discharge. Left eye exhibits no discharge. No scleral icterus.  Neck: Normal range of motion. Neck supple. No JVD present. No tracheal deviation present. No thyromegaly present.  Cardiovascular: Normal rate, regular rhythm, normal heart sounds and intact distal pulses.  Exam reveals no gallop and no friction rub.   No murmur heard. Pulmonary/Chest: Effort normal and breath sounds normal. No stridor. No respiratory distress. She has no decreased breath sounds. She has no wheezes. She has no rhonchi. She has no rales. She exhibits no tenderness.  Abdominal: Soft. Bowel sounds are normal. She exhibits no distension and no mass. There is no tenderness. There is no rebound and no guarding.  Musculoskeletal: Normal range of motion. She exhibits no edema or tenderness.  Lymphadenopathy:    She has no cervical adenopathy.  Neurological: She is oriented to person, place, and time.  Skin: Skin is warm and dry. No rash noted. She is not diaphoretic. No erythema. No pallor.  Psychiatric: She has a normal mood and affect. Her behavior is normal. Judgment  and thought content normal.  Vitals  reviewed.         Assessment & Plan:

## 2014-09-14 NOTE — Patient Instructions (Signed)
Cough, Adult  A cough is a reflex that helps clear your throat and airways. It can help heal the body or may be a reaction to an irritated airway. A cough may only last 2 or 3 weeks (acute) or may last more than 8 weeks (chronic).  CAUSES Acute cough:  Viral or bacterial infections. Chronic cough:  Infections.  Allergies.  Asthma.  Post-nasal drip.  Smoking.  Heartburn or acid reflux.  Some medicines.  Chronic lung problems (COPD).  Cancer. SYMPTOMS   Cough.  Fever.  Chest pain.  Increased breathing rate.  High-pitched whistling sound when breathing (wheezing).  Colored mucus that you cough up (sputum). TREATMENT   A bacterial cough may be treated with antibiotic medicine.  A viral cough must run its course and will not respond to antibiotics.  Your caregiver may recommend other treatments if you have a chronic cough. HOME CARE INSTRUCTIONS   Only take over-the-counter or prescription medicines for pain, discomfort, or fever as directed by your caregiver. Use cough suppressants only as directed by your caregiver.  Use a cold steam vaporizer or humidifier in your bedroom or home to help loosen secretions.  Sleep in a semi-upright position if your cough is worse at night.  Rest as needed.  Stop smoking if you smoke. SEEK IMMEDIATE MEDICAL CARE IF:   You have pus in your sputum.  Your cough starts to worsen.  You cannot control your cough with suppressants and are losing sleep.  You begin coughing up blood.  You have difficulty breathing.  You develop pain which is getting worse or is uncontrolled with medicine.  You have a fever. MAKE SURE YOU:   Understand these instructions.  Will watch your condition.  Will get help right away if you are not doing well or get worse. Document Released: 03/14/2011 Document Revised: 12/08/2011 Document Reviewed: 03/14/2011 ExitCare Patient Information 2015 [employer]. This information is not intended  to replace advice given to you by your health care provider. Make sure you discuss any questions you have with your health care provider.  

## 2014-09-15 ENCOUNTER — Ambulatory Visit: Payer: BC Managed Care – PPO | Admitting: Family Medicine

## 2014-09-15 ENCOUNTER — Encounter: Payer: Self-pay | Admitting: Internal Medicine

## 2014-09-15 NOTE — Assessment & Plan Note (Signed)
I will treat the infection with zithromax and will control the cough with hycodan

## 2014-09-15 NOTE — Assessment & Plan Note (Signed)
Her CXR is normal.

## 2014-09-18 ENCOUNTER — Telehealth: Payer: Self-pay | Admitting: Family Medicine

## 2014-09-18 NOTE — Telephone Encounter (Signed)
Pt is a pt of Dr Lorelei Pont, but she here last week to see Dr Ronnald Ramp.  She said that Dr Ronnald Ramp gave here a Zpac but she is still coughing up [employer]. She wanted to see if he can call in anything more???

## 2014-09-18 NOTE — Telephone Encounter (Signed)
Pt will give it more time, if not better she will call back.

## 2014-09-18 NOTE — Telephone Encounter (Signed)
Give it some more time

## 2015-02-16 ENCOUNTER — Encounter: Payer: Self-pay | Admitting: Family Medicine

## 2015-02-16 ENCOUNTER — Ambulatory Visit (INDEPENDENT_AMBULATORY_CARE_PROVIDER_SITE_OTHER): Payer: BLUE CROSS/BLUE SHIELD | Admitting: Family Medicine

## 2015-02-16 VITALS — BP 124/82 | HR 77 | Temp 98.5°F | Wt 287.0 lb

## 2015-02-16 DIAGNOSIS — J0100 Acute maxillary sinusitis, unspecified: Secondary | ICD-10-CM

## 2015-02-16 MED ORDER — AMOXICILLIN-POT CLAVULANATE 875-125 MG PO TABS: 1.0000 | ORAL_TABLET | Freq: Two times a day (BID) | ORAL | Status: DC

## 2015-02-16 NOTE — Progress Notes (Signed)
Pre visit review using our clinic review tool, if applicable. No additional management support is needed unless otherwise documented below in the visit note.  Tearful discussing her husband's death.  Condolences offered.  She regained her composure.   Sx started about 2 weeks ago.  Facial pain, rhinorrhea, ST.  Sick contacts.  Some cough, better now.  No fevers.  More stuffy recently.    Meds, vitals, and allergies reviewed.   ROS: See HPI.  Otherwise, noncontributory.  GEN: nad, alert and oriented HEENT: mucous membranes moist, tm w/o erythema, nasal exam w/o erythema, clear discharge noted,  OP with cobblestoning, sinuses ttp x4 NECK: supple w/o LA CV: rrr.   PULM: ctab, no inc wob EXT: no edema SKIN: no acute rash

## 2015-02-16 NOTE — Patient Instructions (Signed)
Keep using your regular meds and saline.  Start augmentin and notify us as needed.   Take care.  Try to get some rest.  Glad to see you.

## 2015-02-18 DIAGNOSIS — J0100 Acute maxillary sinusitis, unspecified: Secondary | ICD-10-CM | POA: Insufficient documentation

## 2015-02-18 NOTE — Assessment & Plan Note (Signed)
Continue regular meds, use nasal saline, start augmentin.   D/w pt.   Okay for outpatient f/u.  Nontoxic.   Condolences offered re: her husband's death.

## 2015-04-04 ENCOUNTER — Ambulatory Visit (INDEPENDENT_AMBULATORY_CARE_PROVIDER_SITE_OTHER): Payer: BLUE CROSS/BLUE SHIELD | Admitting: Nurse Practitioner

## 2015-04-04 VITALS — BP 118/76 | HR 87 | Temp 98.1°F | Resp 14 | Ht 68.0 in | Wt 281.6 lb

## 2015-04-04 DIAGNOSIS — R059 Cough, unspecified: Secondary | ICD-10-CM

## 2015-04-04 DIAGNOSIS — R05 Cough: Secondary | ICD-10-CM

## 2015-04-04 MED ORDER — AMOXICILLIN-POT CLAVULANATE 875-125 MG PO TABS: 1.0000 | ORAL_TABLET | Freq: Two times a day (BID) | ORAL | Status: DC

## 2015-04-04 MED ORDER — HYDROCOD POLST-CPM POLST ER 10-8 MG/5ML PO SUER: 5.0000 mL | Freq: Every evening | ORAL | Status: DC | PRN

## 2015-04-04 MED ORDER — METHYLPREDNISOLONE ACETATE 40 MG/ML IJ SUSP: 40.0000 mg | Freq: Once | INTRAMUSCULAR | Status: DC

## 2015-04-04 NOTE — Progress Notes (Signed)
   Subjective:    Patient ID: Meredith Castillo, female    DOB: 06-13-1981, 34 y.o.   MRN: [id number]  HPI  Ms. Dungan is a 34 yo female with a CC of URI x 4  days   1) Yellow productive cough, worse at night, thinner as the day goes on, sore throat, headache, wheezing at night. 99.9 day before yesterday. Coughing spells lead to lightheadedness.   mucinex- daily  Ibuprofen- as needed  Flonase- daily  Peppermint tea/lemon/honey- before bed    Review of Systems  Constitutional: Positive for fatigue. Negative for fever, chills and diaphoresis.  HENT: Positive for sore throat. Negative for congestion, ear discharge, ear pain, postnasal drip, rhinorrhea, sinus pressure and sneezing.   Eyes: Negative for pain, discharge, itching and visual disturbance.  Respiratory: Positive for cough and wheezing. Negative for chest tightness.   Cardiovascular: Negative for chest pain, palpitations and leg swelling.  Gastrointestinal: Negative for nausea, vomiting and diarrhea.  Skin: Negative for rash.  Neurological: Positive for headaches. Negative for dizziness and light-headedness.      Objective:   Physical Exam  Constitutional: She is oriented to person, place, and time. She appears well-developed and well-nourished. No distress.  BP 118/76 mmHg  Pulse 87  Temp(Src) 98.1 F (36.7 C)  Resp 14  Ht 5\' 8"  (1.727 m)  Wt 281 lb 9.6 oz (127.733 kg)  BMI 42.83 kg/m2  SpO2 96%   HENT:  Head: Normocephalic and atraumatic.  Right Ear: External ear normal.  Left Ear: External ear normal.  TM's clear bilaterally  Eyes: EOM are normal. Pupils are equal, round, and reactive to light. Right eye exhibits no discharge. Left eye exhibits no discharge. No scleral icterus.  Neck: Normal range of motion. Neck supple. No thyromegaly present.  Cardiovascular: Normal rate, regular rhythm, normal heart sounds and intact distal pulses.  Exam reveals no gallop and no friction rub.   No murmur heard. Pulmonary/Chest:  Effort normal. No respiratory distress. She has wheezes. She has no rales. She exhibits no tenderness.  Lymphadenopathy:    She has no cervical adenopathy.  Neurological: She is alert and oriented to person, place, and time. No cranial nerve deficit. She exhibits normal muscle tone. Coordination normal.  Skin: Skin is warm and dry. No rash noted. She is not diaphoretic.  Psychiatric: She has a normal mood and affect. Her behavior is normal. Judgment and thought content normal.       Assessment & Plan:

## 2015-04-04 NOTE — Progress Notes (Signed)
Pre visit review using our clinic review tool, if applicable. No additional management support is needed unless otherwise documented below in the visit note. 

## 2015-04-04 NOTE — Patient Instructions (Signed)
Take the syrup 1 tsp (5 mL) at night to help with sleep.   Take the antibiotic as prescribed.  Please take a probiotic ( Align, Floraque or Culturelle) while you are on the antibiotic to prevent a serious antibiotic associated diarrhea  Called clostirudium dificile colitis and a vaginal yeast infection.  Call us if no improvement in 7 days.

## 2015-04-14 ENCOUNTER — Encounter: Payer: Self-pay | Admitting: Nurse Practitioner

## 2015-04-14 NOTE — Assessment & Plan Note (Addendum)
Upper respiratory infection. Asked pt to use albuterol inhaler as needed, will get cough syrup for night time use- gave instructions on use and discussed using before bed only due to drowsy side effects, continue OTC measures and add agumentin if worsens (encouraged probiotic use if on abx).

## 2015-05-10 ENCOUNTER — Encounter: Payer: Self-pay | Admitting: Primary Care

## 2015-05-10 ENCOUNTER — Ambulatory Visit (INDEPENDENT_AMBULATORY_CARE_PROVIDER_SITE_OTHER): Payer: BLUE CROSS/BLUE SHIELD | Admitting: Primary Care

## 2015-05-10 VITALS — BP 124/84 | HR 74 | Temp 98.1°F | Ht 68.0 in | Wt 283.8 lb

## 2015-05-10 DIAGNOSIS — J020 Streptococcal pharyngitis: Secondary | ICD-10-CM

## 2015-05-10 DIAGNOSIS — J029 Acute pharyngitis, unspecified: Secondary | ICD-10-CM | POA: Diagnosis not present

## 2015-05-10 LAB — POCT RAPID STREP A (OFFICE): RAPID STREP A SCREEN: NEGATIVE

## 2015-05-10 MED ORDER — AMOXICILLIN 500 MG PO CAPS: 500.0000 mg | ORAL_CAPSULE | Freq: Two times a day (BID) | ORAL | Status: DC

## 2015-05-10 NOTE — Patient Instructions (Addendum)
Start Amoxicillin antibiotics. Take 1 tablet by mouth twice daily for 10 days.   You may take ibuprofen for pain. You may also try warm, salt gargles and chloraseptic spray for pain.   It was a pleasure meeting you!

## 2015-05-10 NOTE — Progress Notes (Signed)
Subjective:    Patient ID: Meredith Castillo, female    DOB: 15-Sep-1981, 34 y.o.   MRN: [id number]  HPI  Meredith Castillo is a 34 year old female who presents today with a chief complaint of sore throat. Her sore throat has been present since 1am this morning. When she woke up at 7am today her sore throat was worse. She also reports swelling, difficulty swallowing, and "white spots" to the back of her throat. Denies fevers, cough. She's not taken any medication OTC for her symptoms.  Review of Systems  Constitutional: Negative for fever and chills.  HENT: Positive for ear pain, sore throat and trouble swallowing. Negative for congestion, rhinorrhea and sinus pressure.   Respiratory: Negative for cough and shortness of breath.   Cardiovascular: Negative for chest pain.  Musculoskeletal: Negative for myalgias.       Past Medical History  Diagnosis Date  . Bariatric surgery status complicating pregnancy, childbirth, or the puerperium, antepartum condition or complication 9528  . Unspecified vitamin D deficiency   . Morbid obesity with BMI of 40.0-44.9, adult   . Asthma   . PP care - s/p SVD 2/19 11/19/2011  . Iron deficiency anemia     due to gastric bypass.   Marland Kitchen HTN (hypertension)   . PCOS (polycystic ovarian syndrome)   . Vitamin B12 deficiency     due to gastric bypass.   . Mild asthma 01/10/2014    Social History   Social History  . Marital Status: Single    Spouse Name: N/A  . Number of Children: 2  . Years of Education: N/A   Occupational History  .      [employer] employee.    Social History Main Topics  . Smoking status: Former Smoker -- 0.25 packs/day    Quit date: 10/12/2005  . Smokeless tobacco: Never Used  . Alcohol Use: No  . Drug Use: No  . Sexual Activity: Yes    Birth Control/ Protection: IUD   Other Topics Concern  . Not on file   Social History Narrative   Widowed 2016- husband had pulmonary disease   2 kids.     Past Surgical History  Procedure  Laterality Date  . Gastric bypass  2008    Rou-en-Y  . Appendectomy  2005  . Wisdom tooth extraction    . Nasal septum surgery  1996  . Gynecologic surgery  2010    HSG    Family History  Problem Relation Age of Onset  . Anesthesia problems Neg Hx   . Hypertension Mother   . Diabetes Mother   . Heart disease Mother   . Cancer Maternal Grandfather     brain  . Heart attack Paternal Grandfather     Allergies  Allergen Reactions  . Neosporin [Neomycin-Polymyxin-Gramicidin] Other (See Comments)    Patient states that this medication causes a poison ivy rash like reaction.  . Vancomycin Other (See Comments)    Patient states that when this medication is given intravenously, it causes her kidney's to shut down.    Current Outpatient Prescriptions on File Prior to Visit  Medication Sig Dispense Refill  . acetaminophen (TYLENOL) 500 MG tablet Take 500 mg by mouth every 6 (six) hours as needed. Patient took this medication for pain.    Marland Kitchen albuterol (PROVENTIL HFA;VENTOLIN HFA) 108 (90 BASE) MCG/ACT inhaler Inhale 2 puffs into the lungs every 4 (four) hours as needed for wheezing or shortness of breath. 1 Inhaler 3  .  fexofenadine-pseudoephedrine (ALLEGRA-D 24) 180-240 MG per 24 hr tablet Take 1 tablet by mouth daily.    . fluticasone (FLONASE) 50 MCG/ACT nasal spray Place 1 spray into both nostrils daily as needed for allergies.    Marland Kitchen levonorgestrel (MIRENA) 20 MCG/24HR IUD 1 each by Intrauterine route once.    Marland Kitchen LORazepam (ATIVAN) 1 MG tablet Take 1 mg by mouth 2 (two) times daily as needed for anxiety.    . Multiple Vitamins-Minerals (MULTIVITAMIN WITH MINERALS) tablet Take 1 tablet by mouth daily.    Marland Kitchen PARoxetine (PAXIL) 20 MG tablet Take 20 mg by mouth daily.     Current Facility-Administered Medications on File Prior to Visit  Medication Dose Route Frequency Provider Last Rate Last Dose  . methylPREDNISolone acetate (DEPO-MEDROL) injection 40 mg  40 mg Intramuscular Once Rubbie Battiest, NP        BP 124/84 mmHg  Pulse 74  Temp(Src) 98.1 F (36.7 C) (Oral)  Ht 5\' 8"  (1.727 m)  Wt 283 lb 12.8 oz (128.731 kg)  BMI 43.16 kg/m2  SpO2 98%    Objective:   Physical Exam  Constitutional: She appears well-nourished. She appears ill.  HENT:  Right Ear: Tympanic membrane and ear canal normal.  Left Ear: Tympanic membrane and ear canal normal.  Nose: Right sinus exhibits no maxillary sinus tenderness and no frontal sinus tenderness. Left sinus exhibits no maxillary sinus tenderness and no frontal sinus tenderness.  Mouth/Throat: Oropharyngeal exudate, posterior oropharyngeal edema and posterior oropharyngeal erythema present.  Eyes: Conjunctivae are normal. Pupils are equal, round, and reactive to light.  Neck: Neck supple.  Cardiovascular: Normal rate and regular rhythm.   Pulmonary/Chest: Effort normal and breath sounds normal.  Lymphadenopathy:    She has no cervical adenopathy.  Skin: Skin is warm and dry.          Assessment & Plan:  Sore throat:  Present since 1am, worsening throughout the morning. Erythema, edema, and exudate present upon exam. Rapid strep: Negative, however due to presentation will treat empirically. RX for Amoxicillin BID x 10 days Ibuprofen, warm salt gargles.

## 2015-05-10 NOTE — Progress Notes (Signed)
Pre visit review using our clinic review tool, if applicable. No additional management support is needed unless otherwise documented below in the visit note. 

## 2015-07-02 ENCOUNTER — Encounter: Payer: Self-pay | Admitting: Internal Medicine

## 2015-07-02 ENCOUNTER — Ambulatory Visit (INDEPENDENT_AMBULATORY_CARE_PROVIDER_SITE_OTHER): Payer: BLUE CROSS/BLUE SHIELD | Admitting: Internal Medicine

## 2015-07-02 VITALS — BP 130/80 | HR 67 | Temp 97.7°F | Wt 286.0 lb

## 2015-07-02 DIAGNOSIS — Z23 Encounter for immunization: Secondary | ICD-10-CM

## 2015-07-02 DIAGNOSIS — J0100 Acute maxillary sinusitis, unspecified: Secondary | ICD-10-CM | POA: Diagnosis not present

## 2015-07-02 MED ORDER — AMOXICILLIN-POT CLAVULANATE 875-125 MG PO TABS: 1.0000 | ORAL_TABLET | Freq: Two times a day (BID) | ORAL | Status: DC

## 2015-07-02 NOTE — Assessment & Plan Note (Signed)
Has sinus tenderness and marked nasal sensitivity Not clearly a skin infection in nose--she has tried the muciprocin without much effect Discussed continuing/restarting the flonase Will try augmentin ENT if persists

## 2015-07-02 NOTE — Progress Notes (Signed)
Subjective:    Patient ID: Meredith Castillo, female    DOB: 12/15/1980, 34 y.o.   MRN: [id number]  HPI Here due to pain in her nose  She feels her nose seems to be "swollen"--started 2 days The indentation at the tip seems to be gone Has soreness in the left nostril Swelling on that side and tenderness  Now feels something in her left maxillary sinus No teeth problems No pain with chewing  Has some clear nasal discharge from left side No fever  Using flonase for allergies--though not in the last few days  Current Outpatient Prescriptions on File Prior to Visit  Medication Sig Dispense Refill  . acetaminophen (TYLENOL) 500 MG tablet Take 500 mg by mouth every 6 (six) hours as needed. Patient took this medication for pain.    Marland Kitchen albuterol (PROVENTIL HFA;VENTOLIN HFA) 108 (90 BASE) MCG/ACT inhaler Inhale 2 puffs into the lungs every 4 (four) hours as needed for wheezing or shortness of breath. 1 Inhaler 3  . fexofenadine-pseudoephedrine (ALLEGRA-D 24) 180-240 MG per 24 hr tablet Take 1 tablet by mouth daily.    . fluticasone (FLONASE) 50 MCG/ACT nasal spray Place 1 spray into both nostrils daily as needed for allergies.    Marland Kitchen LORazepam (ATIVAN) 1 MG tablet Take 1 mg by mouth 2 (two) times daily as needed for anxiety.    . Multiple Vitamins-Minerals (MULTIVITAMIN WITH MINERALS) tablet Take 1 tablet by mouth daily.    Marland Kitchen PARoxetine (PAXIL) 20 MG tablet Take 20 mg by mouth daily.     No current facility-administered medications on file prior to visit.    Allergies  Allergen Reactions  . Neosporin [Neomycin-Polymyxin-Gramicidin] Other (See Comments)    Patient states that this medication causes a poison ivy rash like reaction.  . Vancomycin Other (See Comments)    Patient states that when this medication is given intravenously, it causes her kidney's to shut down.    Past Medical History  Diagnosis Date  . Bariatric surgery status complicating pregnancy, childbirth, or the  puerperium, antepartum condition or complication 0814  . Unspecified vitamin D deficiency   . Morbid obesity with BMI of 40.0-44.9, adult ([place])   . Asthma   . PP care - s/p SVD 2/19 11/19/2011  . Iron deficiency anemia     due to gastric bypass.   Marland Kitchen HTN (hypertension)   . PCOS (polycystic ovarian syndrome)   . Vitamin B12 deficiency     due to gastric bypass.   . Mild asthma 01/10/2014    Past Surgical History  Procedure Laterality Date  . Gastric bypass  2008    Rou-en-Y  . Appendectomy  2005  . Wisdom tooth extraction    . Nasal septum surgery  1996  . Gynecologic surgery  2010    HSG  . Intrauterine device insertion      Family History  Problem Relation Age of Onset  . Anesthesia problems Neg Hx   . Hypertension Mother   . Diabetes Mother   . Heart disease Mother   . Cancer Maternal Grandfather     brain  . Heart attack Paternal Grandfather     Social History   Social History  . Marital Status: Single    Spouse Name: N/A  . Number of Children: 2  . Years of Education: N/A   Occupational History  .      [employer] employee.    Social History Main Topics  . Smoking status: Former Smoker --  0.25 packs/day    Quit date: 10/12/2005  . Smokeless tobacco: Never Used  . Alcohol Use: No  . Drug Use: No  . Sexual Activity: Yes    Birth Control/ Protection: IUD   Other Topics Concern  . Not on file   Social History Narrative   Widowed 2016- husband had pulmonary disease   2 kids.    Review of Systems  No ear pain Appetite has been normal No apparent insect bite     Objective:   Physical Exam  HENT:  Mouth/Throat: Oropharynx is clear and moist. No oropharyngeal exudate.  Left side of nose very tender but not warm or red Marked inflammation bilaterally--though left is worse Some left maxillary tenderness          Assessment & Plan:

## 2015-07-02 NOTE — Patient Instructions (Signed)
Please set up with [facility] if you are not any better by Thursday.

## 2015-07-02 NOTE — Addendum Note (Signed)
Addended by: Despina Hidden on: 07/02/2015 05:01 PM   Modules accepted: Orders

## 2015-07-04 ENCOUNTER — Ambulatory Visit: Payer: BLUE CROSS/BLUE SHIELD | Admitting: Family Medicine

## 2015-07-06 ENCOUNTER — Telehealth: Payer: Self-pay

## 2015-07-06 MED ORDER — FLUCONAZOLE 150 MG PO TABS: 150.0000 mg | ORAL_TABLET | Freq: Once | ORAL | Status: DC

## 2015-07-06 NOTE — Telephone Encounter (Signed)
Let her I know I sent the Rx

## 2015-07-06 NOTE — Telephone Encounter (Signed)
Left message on machine with detailed results, advised tocall if any questions.   

## 2015-07-06 NOTE — Telephone Encounter (Signed)
Pt left v/m; pt seen 07/02/15 and has taken abx which cleared symptoms for sinus issue. In last 2 days pt has developed horrible yeast infection,no vaginal discharge, but a lot of vaginal and perineal itching and irritation; pt tried OTC cream and that felt like applying battery acid; pt requesting fluconazole; not time released since pt had gastric bypass years ago.  [facility].

## 2015-09-14 ENCOUNTER — Encounter: Payer: Self-pay | Admitting: Family Medicine

## 2015-09-14 ENCOUNTER — Ambulatory Visit (INDEPENDENT_AMBULATORY_CARE_PROVIDER_SITE_OTHER): Payer: BLUE CROSS/BLUE SHIELD | Admitting: Family Medicine

## 2015-09-14 ENCOUNTER — Ambulatory Visit: Payer: BLUE CROSS/BLUE SHIELD | Admitting: Family Medicine

## 2015-09-14 VITALS — BP 130/92 | HR 108 | Temp 98.3°F | Ht 68.0 in | Wt 288.5 lb

## 2015-09-14 DIAGNOSIS — J0100 Acute maxillary sinusitis, unspecified: Secondary | ICD-10-CM

## 2015-09-14 MED ORDER — METHYLPREDNISOLONE ACETATE 40 MG/ML IJ SUSP
40.0000 mg | Freq: Once | INTRAMUSCULAR | Status: AC
  Administered 2015-09-14: 40 mg via INTRAMUSCULAR

## 2015-09-14 MED ORDER — AMOXICILLIN 500 MG PO CAPS: 1000.0000 mg | ORAL_CAPSULE | Freq: Two times a day (BID) | ORAL | Status: DC

## 2015-09-14 NOTE — Assessment & Plan Note (Signed)
>   2 weeks unilateral pain. Treat with course of antibiotics. IM steroid to help with sinus pressure.

## 2015-09-14 NOTE — Addendum Note (Signed)
Addended by: Carter Kitten on: 09/14/2015 03:21 PM   Modules accepted: Orders

## 2015-09-14 NOTE — Patient Instructions (Signed)
Complete antibiotics. Nasal saline 2-3 times a day. Mucinex twice daily.  Call if not improving as expected.

## 2015-09-14 NOTE — Progress Notes (Signed)
Pre visit review using our clinic review tool, if applicable. No additional management support is needed unless otherwise documented below in the visit note. 

## 2015-09-14 NOTE — Progress Notes (Signed)
   Subjective:    Patient ID: Meredith Castillo, female    DOB: December 09, 1980, 34 y.o.   MRN: [id number]  Sinusitis This is a new problem. The current episode started 1 to 4 weeks ago. The problem has been gradually worsening since onset. There has been no fever. The pain is severe. Associated symptoms include congestion, ear pain, headaches, sinus pressure and sneezing. Pertinent negatives include no coughing, neck pain, shortness of breath, sore throat or swollen glands. (Bloody yellow mucus right ear. Pain on skin at eye, teeth hurting on top ) Treatments tried: flonase, ibuprofen, mucinex. The treatment provided mild relief.    Social History /Family History/Past Medical History reviewed and updated if needed.    Review of Systems  HENT: Positive for congestion, ear pain, sinus pressure and sneezing. Negative for sore throat.   Respiratory: Negative for cough and shortness of breath.   Musculoskeletal: Negative for neck pain.  Neurological: Positive for headaches.       Objective:   Physical Exam  Constitutional: Vital signs are normal. She appears well-developed and well-nourished. She is cooperative.  Non-toxic appearance. She does not appear ill. No distress.  HENT:  Head: Normocephalic.  Right Ear: Hearing, external ear and ear canal normal. Tympanic membrane is not erythematous, not retracted and not bulging. A middle ear effusion is present.  Left Ear: Hearing, external ear and ear canal normal. Tympanic membrane is not erythematous, not retracted and not bulging. A middle ear effusion is present.  Nose: Mucosal edema and rhinorrhea present. Right sinus exhibits maxillary sinus tenderness and frontal sinus tenderness. Left sinus exhibits no maxillary sinus tenderness and no frontal sinus tenderness.  Mouth/Throat: Uvula is midline, oropharynx is clear and moist and mucous membranes are normal.  Eyes: Conjunctivae, EOM and lids are normal. Pupils are equal, round, and reactive to light.  Lids are everted and swept, no foreign bodies found.  Neck: Trachea normal and normal range of motion. Neck supple. Carotid bruit is not present. No thyroid mass and no thyromegaly present.  Cardiovascular: Normal rate, regular rhythm, S1 normal, S2 normal, normal heart sounds, intact distal pulses and normal pulses.  Exam reveals no gallop and no friction rub.   No murmur heard. Pulmonary/Chest: Effort normal and breath sounds normal. No tachypnea. No respiratory distress. She has no decreased breath sounds. She has no wheezes. She has no rhonchi. She has no rales.  Neurological: She is alert.  Skin: Skin is warm, dry and intact. No rash noted.  Psychiatric: Her speech is normal and behavior is normal. Judgment normal. Her mood appears not anxious. Cognition and memory are normal. She does not exhibit a depressed mood.          Assessment & Plan:

## 2016-01-18 DIAGNOSIS — F332 Major depressive disorder, recurrent severe without psychotic features: Secondary | ICD-10-CM | POA: Diagnosis not present

## 2016-07-14 DIAGNOSIS — F332 Major depressive disorder, recurrent severe without psychotic features: Secondary | ICD-10-CM | POA: Diagnosis not present

## 2016-07-17 DIAGNOSIS — F332 Major depressive disorder, recurrent severe without psychotic features: Secondary | ICD-10-CM | POA: Diagnosis not present

## 2016-07-21 ENCOUNTER — Encounter: Payer: Self-pay | Admitting: Primary Care

## 2016-07-21 ENCOUNTER — Ambulatory Visit (INDEPENDENT_AMBULATORY_CARE_PROVIDER_SITE_OTHER): Payer: BLUE CROSS/BLUE SHIELD | Admitting: Primary Care

## 2016-07-21 VITALS — BP 130/80 | HR 85 | Temp 98.1°F | Ht 68.0 in | Wt 305.8 lb

## 2016-07-21 DIAGNOSIS — J069 Acute upper respiratory infection, unspecified: Secondary | ICD-10-CM

## 2016-07-21 DIAGNOSIS — B9789 Other viral agents as the cause of diseases classified elsewhere: Secondary | ICD-10-CM

## 2016-07-21 NOTE — Progress Notes (Signed)
Subjective:    Patient ID: Meredith Castillo, female    DOB: 28-Jan-1981, 35 y.o.   MRN: [id number]  HPI  Meredith Castillo is a 35 year old female who presents today with a chief complaint of fever. She also reports fatigue, mild nasal congestion, mild cough, sinus pressure. Her symptoms began this morning when waking and have progressed since. She checked her temperature this morning upon waking which was 99.9 and then 1 hour later which was 100.9. She took ibuprofen about 2 hours ago with improvement in her fever. She's also taken Zyrtec and used her Flonase on an inconsistent basis. She took her daughters to a flu clinic for their vaccinations Saturday this past weekend. She denies sore throat, nausea, body aches, sick contacts.    Review of Systems  Constitutional: Positive for chills, fatigue and fever.  HENT: Positive for congestion, postnasal drip and sinus pressure. Negative for ear pain and sore throat.   Respiratory: Positive for cough. Negative for shortness of breath.   Cardiovascular: Negative for chest pain.       Past Medical History:  Diagnosis Date  . Asthma   . Bariatric surgery status complicating pregnancy, childbirth, or the puerperium, antepartum condition or complication [id number]  . HTN (hypertension)   . Iron deficiency anemia    due to gastric bypass.   . Mild asthma 01/10/2014  . Morbid obesity with BMI of 40.0-44.9, adult ([place])   . PCOS (polycystic ovarian syndrome)   . PP care - s/p SVD 2/19 11/19/2011  . Unspecified vitamin D deficiency   . Vitamin B12 deficiency    due to gastric bypass.      Social History   Social History  . Marital status: Single    Spouse name: N/A  . Number of children: 2  . Years of education: N/A   Occupational History  .      [employer] employee.    Social History Main Topics  . Smoking status: Former Smoker    Packs/day: 0.25    Quit date: 10/12/2005  . Smokeless tobacco: Never Used  . Alcohol use No  . Drug use: No  . Sexual  activity: Yes    Birth control/ protection: IUD   Other Topics Concern  . Not on file   Social History Narrative   Widowed 2016- husband had pulmonary disease   2 kids.     Past Surgical History:  Procedure Laterality Date  . APPENDECTOMY  2005  . GASTRIC BYPASS  2008   Rou-en-Y  . gynecologic surgery  2010   HSG  . INTRAUTERINE DEVICE INSERTION    . NASAL SEPTUM SURGERY  1996  . WISDOM TOOTH EXTRACTION      Family History  Problem Relation Age of Onset  . Anesthesia problems Neg Hx   . Hypertension Mother   . Diabetes Mother   . Heart disease Mother   . Cancer Maternal Grandfather     brain  . Heart attack Paternal Grandfather     Allergies  Allergen Reactions  . Neosporin [Neomycin-Polymyxin-Gramicidin] Other (See Comments)    Patient states that this medication causes a poison ivy rash like reaction.  . Vancomycin Other (See Comments)    Patient states that when this medication is given intravenously, it causes her kidney's to shut down.    Current Outpatient Prescriptions on File Prior to Visit  Medication Sig Dispense Refill  . albuterol (PROVENTIL HFA;VENTOLIN HFA) 108 (90 BASE) MCG/ACT inhaler Inhale 2 puffs into  the lungs every 4 (four) hours as needed for wheezing or shortness of breath. 1 Inhaler 3  . fexofenadine-pseudoephedrine (ALLEGRA-D 24) 180-240 MG per 24 hr tablet Take 1 tablet by mouth daily.    . fluticasone (FLONASE) 50 MCG/ACT nasal spray Place 1 spray into both nostrils daily as needed for allergies.    Marland Kitchen ibuprofen (ADVIL,MOTRIN) 200 MG tablet Take 800 mg by mouth every 8 (eight) hours as needed.    . Multiple Vitamins-Minerals (MULTIVITAMIN WITH MINERALS) tablet Take 1 tablet by mouth daily.    Marland Kitchen PARoxetine (PAXIL) 20 MG tablet Take 20 mg by mouth daily.     No current facility-administered medications on file prior to visit.     BP 130/80   Pulse 85   Temp 98.1 F (36.7 C) (Oral)   Ht 5\' 8"  (1.727 m)   Wt (!) 305 lb 12.8 oz (138.7  kg)   SpO2 95%   BMI 46.50 kg/m    Objective:   Physical Exam  Constitutional: She appears well-nourished.  HENT:  Right Ear: Tympanic membrane and ear canal normal.  Left Ear: Tympanic membrane and ear canal normal.  Nose: Mucosal edema present. Right sinus exhibits maxillary sinus tenderness and frontal sinus tenderness. Left sinus exhibits maxillary sinus tenderness and frontal sinus tenderness.  Mouth/Throat: Oropharynx is clear and moist.  Cobble stoning to posterior pharynx  Eyes: Conjunctivae are normal.  Neck: Neck supple.  Cardiovascular: Normal rate and regular rhythm.   Pulmonary/Chest: Effort normal and breath sounds normal. She has no wheezes. She has no rales.  Lymphadenopathy:    She has no cervical adenopathy.  Skin: Skin is warm and dry.          Assessment & Plan:  Viral URI:   Fatigue, PND, fever, sinus pressure since this morning. Fevers improved with Ibuprofen. Overall feeling the same. Exam with sinus tenderness, otherwise unremarkable. Does appear fatigued, not sickly. Vitals stable. Rapid Flu: Negative. Suspect viral involvement and will treat with supportive measures. Ibuprofen or tylenol for fevers; Flonase, Zyrtec, fluids, rest. Return precautions provided.  Sheral Flow, NP

## 2016-07-21 NOTE — Progress Notes (Signed)
Pre visit review using our clinic review tool, if applicable. No additional management support is needed unless otherwise documented below in the visit note. 

## 2016-07-21 NOTE — Patient Instructions (Signed)
Your symptoms are representative of a viral illness which will resolve on its own over time. Our goal is to treat your symptoms in order to aid your body in the healing process and to make you more comfortable.   Continue Ibuprofen for fevers. You could also try tylenol for fevers and/or body aches.  Restart your Zyrtec and take daily for the next several weeks.  Restart Flonase or try using saline nasal sprays for sinus pressure.  Please notify me if you develop persistent fevers of 101, start coughing up green mucous, notice increased fatigue or weakness, or feel worse after 1 week of onset of symptoms.   Increase consumption of water intake and rest.  It was a pleasure meeting you!

## 2016-07-31 DIAGNOSIS — F332 Major depressive disorder, recurrent severe without psychotic features: Secondary | ICD-10-CM | POA: Diagnosis not present

## 2016-08-04 ENCOUNTER — Emergency Department (HOSPITAL_COMMUNITY): Payer: BLUE CROSS/BLUE SHIELD

## 2016-08-04 ENCOUNTER — Emergency Department (HOSPITAL_COMMUNITY)
Admission: EM | Admit: 2016-08-04 | Discharge: 2016-08-04 | Disposition: A | Discharge status: Home / Self Care | Payer: BLUE CROSS/BLUE SHIELD | Attending: Emergency Medicine | Admitting: Emergency Medicine

## 2016-08-04 ENCOUNTER — Encounter (HOSPITAL_COMMUNITY): Payer: Self-pay

## 2016-08-04 DIAGNOSIS — M7989 Other specified soft tissue disorders: Secondary | ICD-10-CM | POA: Diagnosis not present

## 2016-08-04 DIAGNOSIS — I10 Essential (primary) hypertension: Secondary | ICD-10-CM | POA: Insufficient documentation

## 2016-08-04 DIAGNOSIS — R062 Wheezing: Secondary | ICD-10-CM | POA: Insufficient documentation

## 2016-08-04 DIAGNOSIS — R609 Edema, unspecified: Secondary | ICD-10-CM

## 2016-08-04 DIAGNOSIS — Z79899 Other long term (current) drug therapy: Secondary | ICD-10-CM | POA: Diagnosis not present

## 2016-08-04 DIAGNOSIS — R600 Localized edema: Secondary | ICD-10-CM | POA: Insufficient documentation

## 2016-08-04 DIAGNOSIS — Z87891 Personal history of nicotine dependence: Secondary | ICD-10-CM | POA: Insufficient documentation

## 2016-08-04 LAB — BASIC METABOLIC PANEL
Anion gap: 7 (ref 5–15)
BUN: 5 mg/dL — AB (ref 6–20)
CALCIUM: 8.6 mg/dL — AB (ref 8.9–10.3)
CO2: 27 mmol/L (ref 22–32)
CREATININE: 0.71 mg/dL (ref 0.44–1.00)
Chloride: 103 mmol/L (ref 101–111)
GFR calc non Af Amer: >60 mL/min (ref >60)
Glucose, Bld: 94 mg/dL (ref 65–99)
Potassium: 3.9 mmol/L (ref 3.5–5.1)
SODIUM: 137 mmol/L (ref 135–145)

## 2016-08-04 LAB — CBC
HCT: 33.2 % — ABNORMAL LOW (ref 36.0–46.0)
Hemoglobin: 10 g/dL — ABNORMAL LOW (ref 12.0–15.0)
MCH: 22.9 pg — ABNORMAL LOW (ref 26.0–34.0)
MCHC: 30.1 g/dL (ref 30.0–36.0)
MCV: 76 fL — AB (ref 78.0–100.0)
PLATELETS: 290 10*3/uL (ref 150–400)
RBC: 4.37 MIL/uL (ref 3.87–5.11)
RDW: 17.8 % — AB (ref 11.5–15.5)
WBC: 6.1 10*3/uL (ref 4.0–10.5)

## 2016-08-04 LAB — BRAIN NATRIURETIC PEPTIDE: B NATRIURETIC PEPTIDE 5: 13.5 pg/mL (ref 0.0–100.0)

## 2016-08-04 MED ORDER — ALBUTEROL SULFATE (2.5 MG/3ML) 0.083% IN NEBU: 5.0000 mg | INHALATION_SOLUTION | Freq: Once | RESPIRATORY_TRACT | Status: DC

## 2016-08-04 NOTE — ED Provider Notes (Signed)
[facility] DEPT Provider Note   CSN: [id number] Arrival date & time: 08/04/16  1046     History   Chief Complaint Chief Complaint  Patient presents with  . Leg Swelling    HPI Meredith Castillo is a 35 y.o. female.  Patient presents to the emergency department with chief complaints of bilateral lower extremity swelling. She states that she noticed the symptoms this morning when she awoke. She denies any chest pain or shortness of breath. Denies any cough or fever. She denies any recent travel. Denies any history of PE, DVT, or CHF. She does report a history of asthma. She states that she had some leg swelling once before when she was admitted at [facility] and was given vancomycin. She denies any new medications. She states that the swelling is causing mild aching pain in her legs. There are no other associated symptoms or modifying factors.   The history is provided by the patient. No language interpreter was used.    Past Medical History:  Diagnosis Date  . Asthma   . Bariatric surgery status complicating pregnancy, childbirth, or the puerperium, antepartum condition or complication [id number]  . HTN (hypertension)   . Iron deficiency anemia    due to gastric bypass.   . Mild asthma 01/10/2014  . Morbid obesity with BMI of 40.0-44.9, adult ([place])   . PCOS (polycystic ovarian syndrome)   . PP care - s/p SVD 2/19 11/19/2011  . Unspecified vitamin D deficiency   . Vitamin B12 deficiency    due to gastric bypass.     Patient Active Problem List   Diagnosis Date Noted  . Acute maxillary sinusitis 02/18/2015  . Cough 09/14/2014  . Pneumonia due to organism 09/14/2014  . Mild asthma 01/10/2014  . Allergic rhinitis 12/17/2012  . Recurrent sinus infections 12/17/2012  . H/O gastric bypass 12/17/2012  . PCOS (polycystic ovarian syndrome)   . Morbid obesity with BMI of 40.0-44.9, adult ([place])   . Iron deficiency anemia   . Vitamin B12 deficiency     Past Surgical History:    Procedure Laterality Date  . APPENDECTOMY  2005  . GASTRIC BYPASS  2008   Rou-en-Y  . gynecologic surgery  2010   HSG  . INTRAUTERINE DEVICE INSERTION    . NASAL SEPTUM SURGERY  1996  . WISDOM TOOTH EXTRACTION      OB History    Gravida Para Term Preterm AB Living   2 2 2  0   2   SAB TAB Ectopic Multiple Live Births           2       Home Medications    Prior to Admission medications   Medication Sig Start Date End Date Taking? Authorizing Provider  albuterol (PROVENTIL HFA;VENTOLIN HFA) 108 (90 BASE) MCG/ACT inhaler Inhale 2 puffs into the lungs every 4 (four) hours as needed for wheezing or shortness of breath. 01/09/14   Owens Loffler, MD  diazepam (VALIUM) 10 MG tablet Take 10 mg by mouth 3 (three) times daily as needed.  07/15/16   Historical Provider, MD  fexofenadine-pseudoephedrine (ALLEGRA-D 24) 180-240 MG per 24 hr tablet Take 1 tablet by mouth daily.    Historical Provider, MD  fluticasone (FLONASE) 50 MCG/ACT nasal spray Place 1 spray into both nostrils daily as needed for allergies.    Historical Provider, MD  ibuprofen (ADVIL,MOTRIN) 200 MG tablet Take 800 mg by mouth every 8 (eight) hours as needed.    Historical Provider, MD  Multiple Vitamins-Minerals (MULTIVITAMIN WITH MINERALS) tablet Take 1 tablet by mouth daily.    Historical Provider, MD  PARoxetine (PAXIL) 20 MG tablet Take 20 mg by mouth daily.    Historical Provider, MD    Family History Family History  Problem Relation Age of Onset  . Hypertension Mother   . Diabetes Mother   . Heart disease Mother   . Cancer Maternal Grandfather     brain  . Heart attack Paternal Grandfather   . Anesthesia problems Neg Hx     Social History Social History  Substance Use Topics  . Smoking status: Former Smoker    Packs/day: 0.25    Quit date: 10/12/2005  . Smokeless tobacco: Never Used  . Alcohol use No     Allergies   Neosporin [neomycin-polymyxin-gramicidin] and Vancomycin   Review of  Systems Review of Systems  Respiratory: Positive for wheezing. Negative for cough and shortness of breath.   Cardiovascular: Positive for leg swelling. Negative for chest pain.  All other systems reviewed and are negative.    Physical Exam Updated Vital Signs BP (!) 172/118 (BP Location: Right Arm)   Pulse 105   Temp 98.5 F (36.9 C) (Oral)   Resp 19   Ht 5\' 8"  (1.727 m)   Wt (!) 136.5 kg   SpO2 100%   BMI 45.77 kg/m   Physical Exam  Constitutional: She is oriented to person, place, and time. She appears well-developed and well-nourished.  HENT:  Head: Normocephalic and atraumatic.  Eyes: Conjunctivae and EOM are normal. Pupils are equal, round, and reactive to light.  Neck: Normal range of motion. Neck supple.  Cardiovascular: Normal rate and regular rhythm.  Exam reveals no gallop and no friction rub.   No murmur heard. Pulmonary/Chest: Effort normal and breath sounds normal. No respiratory distress. She has no wheezes. She has no rales. She exhibits no tenderness.  Abdominal: Soft. Bowel sounds are normal. She exhibits no distension and no mass. There is no tenderness. There is no rebound and no guarding.  Musculoskeletal: Normal range of motion. She exhibits no edema or tenderness.  Bilateral lower extremity swelling, no pitting edema, no calf tenderness, doubt DVT  Neurological: She is alert and oriented to person, place, and time.  Skin: Skin is warm and dry.  No erythema, cellulitis, or abscess  Psychiatric: She has a normal mood and affect. Her behavior is normal. Judgment and thought content normal.  Nursing note and vitals reviewed.    ED Treatments / Results  Labs (all labs ordered are listed, but only abnormal results are displayed) Labs Reviewed  BASIC METABOLIC PANEL - Abnormal; Notable for the following:       Result Value   BUN 5 (*)    Calcium 8.6 (*)    All other components within normal limits  CBC - Abnormal; Notable for the following:     Hemoglobin 10.0 (*)    HCT 33.2 (*)    MCV 76.0 (*)    MCH 22.9 (*)    RDW 17.8 (*)    All other components within normal limits  BRAIN NATRIURETIC PEPTIDE    EKG  EKG Interpretation None       Radiology No results found.  Procedures Procedures (including critical care time)  Medications Ordered in ED Medications - No data to display   Initial Impression / Assessment and Plan / ED Course  I have reviewed the triage vital signs and the nursing notes.  Pertinent labs & imaging results  that were available during my care of the patient were reviewed by me and considered in my medical decision making (see chart for details).  Clinical Course     Patient with leg swelling that she noticed this morning.  No SOB, CP, or cough. She does have some mild wheezing on exam.  Will check labs and CXR.  Will give breathing treatment.  Laboratory workup is reassuring. Chest x-ray is normal. BNP is not elevated. Patient does not have any chest pain or shortness of breath. Her lower extremity swelling was first noticed this morning. She does not have any pitting edema. Have encouraged her to use compression stockings, and to elevate her feet as much as possible. Recommend primary care follow-up.  Patient discussed with Dr. Gilford Raid, who agrees with the plan.  Final Clinical Impressions(s) / ED Diagnoses   Final diagnoses:  Peripheral edema    New Prescriptions New Prescriptions   No medications on file     Montine Circle, PA-C 08/04/16 [place], MD 08/04/16 1326

## 2016-08-04 NOTE — ED Triage Notes (Signed)
Pt reports leg swelling that began suddenly last night. She denies recent travel. Pt is tearful in triage and states she is very concerned and scared.

## 2016-08-04 NOTE — Discharge Instructions (Signed)
Please use compression stockings.  Please keep your legs up and elevated.  Please follow-up with your doctor.

## 2016-08-05 ENCOUNTER — Ambulatory Visit (INDEPENDENT_AMBULATORY_CARE_PROVIDER_SITE_OTHER): Payer: BLUE CROSS/BLUE SHIELD | Admitting: Family Medicine

## 2016-08-05 ENCOUNTER — Other Ambulatory Visit: Payer: Self-pay | Admitting: Family Medicine

## 2016-08-05 ENCOUNTER — Encounter: Payer: Self-pay | Admitting: Family Medicine

## 2016-08-05 ENCOUNTER — Telehealth: Payer: Self-pay | Admitting: Radiology

## 2016-08-05 VITALS — BP 136/82 | HR 91 | Temp 97.9°F | Wt 330.8 lb

## 2016-08-05 DIAGNOSIS — D509 Iron deficiency anemia, unspecified: Secondary | ICD-10-CM

## 2016-08-05 DIAGNOSIS — R600 Localized edema: Secondary | ICD-10-CM | POA: Diagnosis not present

## 2016-08-05 DIAGNOSIS — R7989 Other specified abnormal findings of blood chemistry: Secondary | ICD-10-CM

## 2016-08-05 DIAGNOSIS — Z23 Encounter for immunization: Secondary | ICD-10-CM

## 2016-08-05 LAB — COMPREHENSIVE METABOLIC PANEL
ALK PHOS: 85 U/L (ref 39–117)
ALT: 13 U/L (ref 0–35)
AST: 21 U/L (ref 0–37)
Albumin: 3.8 g/dL (ref 3.5–5.2)
BILIRUBIN TOTAL: 0.3 mg/dL (ref 0.2–1.2)
BUN: 8 mg/dL (ref 6–23)
CALCIUM: 8.9 mg/dL (ref 8.4–10.5)
CO2: 29 mEq/L (ref 19–32)
Chloride: 102 mEq/L (ref 96–112)
Creatinine, Ser: 0.65 mg/dL (ref 0.40–1.20)
GFR: 108.51 mL/min (ref >60.00)
GLUCOSE: 99 mg/dL (ref 70–99)
POTASSIUM: 4.2 meq/L (ref 3.5–5.1)
Sodium: 137 mEq/L (ref 135–145)
TOTAL PROTEIN: 6.9 g/dL (ref 6.0–8.3)

## 2016-08-05 LAB — BRAIN NATRIURETIC PEPTIDE: PRO B NATRI PEPTIDE: 35 pg/mL (ref 0.0–100.0)

## 2016-08-05 LAB — D-DIMER, QUANTITATIVE (NOT AT ARMC): D DIMER QUANT: 1.04 ug{FEU}/mL — AB (ref <0.50)

## 2016-08-05 LAB — TSH: TSH: 2.23 u[IU]/mL (ref 0.35–4.50)

## 2016-08-05 MED ORDER — FUROSEMIDE 20 MG PO TABS: ORAL_TABLET | ORAL | 0 refills | Status: DC

## 2016-08-05 NOTE — Patient Instructions (Signed)
Great to see you. I will call you with your lab results.  Please schedule an appointment with Dr. Lorelei Pont for Thursday.  We will repeat your potassium at the time.  Increase potassium in your diet- increase orange juice, bananas, potatoes.

## 2016-08-05 NOTE — Assessment & Plan Note (Signed)
New- etiology unclear. BNP normal  Yesterday which is reassuring.  She has gained a significant amount of weight within weeks.  Will repeat BNP and draw additional labs today. Given amount of discomfort- lasix 20 mg daily x 3 days, advised increased potassium intake and follow up with PCP in two days. The patient indicates understanding of these issues and agrees with the plan.

## 2016-08-05 NOTE — Telephone Encounter (Signed)
Stat D Dimer result called, 1.04. Results called to Dr Deborra Medina

## 2016-08-05 NOTE — Progress Notes (Signed)
Subjective:   Patient ID: Meredith Castillo, female    DOB: 09-27-1981, 35 y.o.   MRN: [id number]  Meredith Castillo is a pleasant 35 y.o. year old female pt of Dr. Lorelei Pont, new to me, who presents to clinic today with Hospitalization Follow-up  on 08/05/2016  HPI:  Martin Majestic to [facility] yesterday for acute onset of bilateral LE edema.  Notes reviewed.  Woke up with the symptoms that morning.  No recent travel or h/o DVT.  Per pt, was admitted to [facility] and given IV vanc for an infection in past and had LE edema as a reaction to it..  No CP or SOB.  Legs are very tender, not red or warm.    BNP normal.   CXR- no acute findings   CBC showed anemia.  Per pt, this is not a new finding. Advised to wear compression hose and f/u with PCP.  According to our scales, has gained 25 pounds in 1 month.  Compression hose have helped.  Lab Results  Component Value Date   NA 137 08/04/2016   K 3.9 08/04/2016   CL 103 08/04/2016   CO2 27 08/04/2016    Lab Results  Component Value Date   WBC 6.1 08/04/2016   HGB 10.0 (L) 08/04/2016   HCT 33.2 (L) 08/04/2016   MCV 76.0 (L) 08/04/2016   PLT 290 08/04/2016   Dg Chest 2 View  Result Date: 08/04/2016 CLINICAL DATA:  Bilateral leg swelling today. EXAM: CHEST  2 VIEW COMPARISON:  09/14/2014. FINDINGS: Normal sized heart. Clear lungs. Stable mildly elevated left hemidiaphragm. Thoracic spine degenerative changes. IMPRESSION: No acute abnormality. Electronically Signed   By: Claudie Revering M.D.   On: 08/04/2016 12:49     Current Outpatient Prescriptions on File Prior to Visit  Medication Sig Dispense Refill  . albuterol (PROVENTIL HFA;VENTOLIN HFA) 108 (90 BASE) MCG/ACT inhaler Inhale 2 puffs into the lungs every 4 (four) hours as needed for wheezing or shortness of breath. 1 Inhaler 3  . cetirizine (ZYRTEC) 10 MG tablet Take 10 mg by mouth daily.    . diazepam (VALIUM) 10 MG tablet Take 10 mg by mouth 3 (three) times daily as needed for anxiety or  sleep.     . fluticasone (FLONASE) 50 MCG/ACT nasal spray Place 1 spray into both nostrils daily as needed for allergies.    Marland Kitchen ibuprofen (ADVIL,MOTRIN) 200 MG tablet Take 400-800 mg by mouth every 8 (eight) hours as needed for headache or moderate pain.     Marland Kitchen levonorgestrel (MIRENA) 20 MCG/24HR IUD 1 each by Intrauterine route once.    . Multiple Vitamins-Minerals (MULTIVITAMIN WITH MINERALS) tablet Take 1 tablet by mouth daily.    . mupirocin ointment (BACTROBAN) 2 % Place 1 application into the nose as needed (for wound care).    Marland Kitchen PARoxetine (PAXIL) 30 MG tablet Take 30 mg by mouth daily.     No current facility-administered medications on file prior to visit.     Allergies  Allergen Reactions  . Vancomycin Other (See Comments)    Patient states that when this medication is given intravenously, it causes her kidney's to shut down.  . Neosporin [Neomycin-Polymyxin-Gramicidin] Other (See Comments)    Patient states that this medication causes a poison ivy rash like reaction.    Past Medical History:  Diagnosis Date  . Asthma   . Bariatric surgery status complicating pregnancy, childbirth, or the puerperium, antepartum condition or complication [id number]  . HTN (hypertension)   .  Iron deficiency anemia    due to gastric bypass.   . Mild asthma 01/10/2014  . Morbid obesity with BMI of 40.0-44.9, adult ([place])   . PCOS (polycystic ovarian syndrome)   . PP care - s/p SVD 2/19 11/19/2011  . Unspecified vitamin D deficiency   . Vitamin B12 deficiency    due to gastric bypass.     Past Surgical History:  Procedure Laterality Date  . APPENDECTOMY  2005  . GASTRIC BYPASS  2008   Rou-en-Y  . gynecologic surgery  2010   HSG  . INTRAUTERINE DEVICE INSERTION    . NASAL SEPTUM SURGERY  1996  . WISDOM TOOTH EXTRACTION      Family History  Problem Relation Age of Onset  . Hypertension Mother   . Diabetes Mother   . Heart disease Mother   . Cancer Maternal Grandfather     brain  . Heart  attack Paternal Grandfather   . Anesthesia problems Neg Hx     Social History   Social History  . Marital status: Single    Spouse name: N/A  . Number of children: 2  . Years of education: N/A   Occupational History  .      [employer] employee.    Social History Main Topics  . Smoking status: Former Smoker    Packs/day: 0.25    Quit date: 10/12/2005  . Smokeless tobacco: Never Used  . Alcohol use No  . Drug use: No  . Sexual activity: Yes    Birth control/ protection: IUD   Other Topics Concern  . Not on file   Social History Narrative   Widowed 2016- husband had pulmonary disease   2 kids.    The PMH, PSH, Social History, Family History, Medications, and allergies have been reviewed in [facility], and have been updated if relevant.   Review of Systems  Constitutional: Negative.   HENT: Negative.   Respiratory: Negative.   Cardiovascular: Positive for leg swelling. Negative for chest pain and palpitations.  Gastrointestinal: Negative.   Neurological: Negative.   Hematological: Negative.   All other systems reviewed and are negative.      Objective:    BP 136/82   Pulse 91   Temp 97.9 F (36.6 C) (Oral)   Wt (!) 330 lb 12 oz (150 kg)   SpO2 100%   BMI 50.29 kg/m   Wt Readings from Last 3 Encounters:  08/05/16 (!) 330 lb 12 oz (150 kg)  08/04/16 (!) 301 lb (136.5 kg)  07/21/16 (!) 305 lb 12.8 oz (138.7 kg)    Physical Exam  Constitutional: She is oriented to person, place, and time. No distress.  obese  HENT:  Head: Normocephalic and atraumatic.  Eyes: Conjunctivae are normal.  Cardiovascular: Normal rate and regular rhythm.   Pulmonary/Chest: Effort normal and breath sounds normal.  Musculoskeletal: Normal range of motion. She exhibits edema.  Compression hose in place- pt does not want to remove b/c difficult to do so  Neurological: She is alert and oriented to person, place, and time. No cranial nerve deficit.  Skin: Skin is warm and dry. She is  not diaphoretic.  Psychiatric: She has a normal mood and affect. Her behavior is normal. Judgment and thought content normal.  Nursing note and vitals reviewed.         Assessment & Plan:   Iron deficiency anemia, unspecified iron deficiency anemia type  Bilateral leg edema No Follow-up on file.

## 2016-08-05 NOTE — Progress Notes (Signed)
Pre visit review using our clinic review tool, if applicable. No additional management support is needed unless otherwise documented below in the visit note. 

## 2016-08-06 ENCOUNTER — Ambulatory Visit (INDEPENDENT_AMBULATORY_CARE_PROVIDER_SITE_OTHER)
Admission: RE | Admit: 2016-08-06 | Discharge: 2016-08-06 | Disposition: A | Discharge status: Home / Self Care | Payer: BLUE CROSS/BLUE SHIELD | Source: Ambulatory Visit | Attending: Family Medicine | Admitting: Family Medicine

## 2016-08-06 DIAGNOSIS — R7989 Other specified abnormal findings of blood chemistry: Secondary | ICD-10-CM | POA: Diagnosis not present

## 2016-08-06 DIAGNOSIS — M7989 Other specified soft tissue disorders: Secondary | ICD-10-CM | POA: Diagnosis not present

## 2016-08-06 MED ORDER — IOPAMIDOL (ISOVUE-370) INJECTION 76%
80.0000 mL | Freq: Once | INTRAVENOUS | Status: AC | PRN
  Administered 2016-08-06: 80 mL via INTRAVENOUS

## 2016-08-07 ENCOUNTER — Ambulatory Visit (HOSPITAL_COMMUNITY)
Admission: RE | Admit: 2016-08-07 | Discharge: 2016-08-07 | Disposition: A | Discharge status: Home / Self Care | Payer: BLUE CROSS/BLUE SHIELD | Source: Ambulatory Visit | Attending: Family Medicine | Admitting: Family Medicine

## 2016-08-07 ENCOUNTER — Telehealth: Payer: Self-pay

## 2016-08-07 ENCOUNTER — Encounter: Payer: Self-pay | Admitting: Family Medicine

## 2016-08-07 ENCOUNTER — Ambulatory Visit (INDEPENDENT_AMBULATORY_CARE_PROVIDER_SITE_OTHER): Payer: BLUE CROSS/BLUE SHIELD | Admitting: Family Medicine

## 2016-08-07 VITALS — BP 110/80 | HR 91 | Temp 97.9°F | Ht 67.0 in | Wt 323.5 lb

## 2016-08-07 DIAGNOSIS — R7989 Other specified abnormal findings of blood chemistry: Secondary | ICD-10-CM

## 2016-08-07 DIAGNOSIS — R600 Localized edema: Secondary | ICD-10-CM | POA: Diagnosis not present

## 2016-08-07 DIAGNOSIS — I8393 Asymptomatic varicose veins of bilateral lower extremities: Secondary | ICD-10-CM | POA: Insufficient documentation

## 2016-08-07 NOTE — Progress Notes (Signed)
*  PRELIMINARY RESULTS* Vascular Ultrasound Lower extremity venous duplex has been completed.  Preliminary findings: No evidence of DVT or baker's cyst.    Called results to Butch Penny.    Landry Mellow, RDMS, RVT  08/07/2016, 2:23 PM

## 2016-08-07 NOTE — Progress Notes (Signed)
Dr. Frederico Hamman T. Tarhonda Hollenberg, MD, [facility] [place], [address] Phone: [phone] Fax: [phone]  08/07/2016  Patient: Meredith Castillo, MRN: [id number], DOB: 05-Aug-1981, 35 y.o.  Primary Physician:  Owens Loffler, MD   Chief Complaint  Patient presents with  . Follow-up    Bilateral Leg Edema   Subjective:   Meredith Castillo is a 35 y.o. very pleasant female patient who presents with the following:  Pleasant young patient who is not been seen by me personally in several years who presents with bilateral lower extremity edema.  She has done nothing that she can recall to precipitate this event.  She saw my partner yesterday, and she had an elevated d-dimer.  She had a follow-up CT angiogram which was negative.  She also has had multiple laboratory studies done, all of which have been negative for signs of kidney failure, heart failure, or liver abnormalities.  B edema, woke up that morning and woke up - bam they got swollen.  No trauma at all.   She is currently wearing compression socks.  Past Medical History, Surgical History, Social History, Family History, Problem List, Medications, and Allergies have been reviewed and updated if relevant.  Patient Active Problem List   Diagnosis Date Noted  . Bilateral leg edema 08/05/2016  . Cough 09/14/2014  . Pneumonia due to organism 09/14/2014  . Mild asthma 01/10/2014  . Allergic rhinitis 12/17/2012  . Recurrent sinus infections 12/17/2012  . H/O gastric bypass 12/17/2012  . PCOS (polycystic ovarian syndrome)   . Morbid obesity with BMI of 40.0-44.9, adult ([place])   . Iron deficiency anemia   . Vitamin B12 deficiency     Past Medical History:  Diagnosis Date  . Asthma   . Bariatric surgery status complicating pregnancy, childbirth, or the puerperium, antepartum condition or complication [id number]  . HTN (hypertension)   . Iron deficiency anemia    due to gastric  bypass.   . Mild asthma 01/10/2014  . Morbid obesity with BMI of 40.0-44.9, adult ([place])   . PCOS (polycystic ovarian syndrome)   . PP care - s/p SVD 2/19 11/19/2011  . Unspecified vitamin D deficiency   . Vitamin B12 deficiency    due to gastric bypass.     Past Surgical History:  Procedure Laterality Date  . APPENDECTOMY  2005  . GASTRIC BYPASS  2008   Rou-en-Y  . gynecologic surgery  2010   HSG  . INTRAUTERINE DEVICE INSERTION    . NASAL SEPTUM SURGERY  1996  . WISDOM TOOTH EXTRACTION      Social History   Social History  . Marital status: Single    Spouse name: N/A  . Number of children: 2  . Years of education: N/A   Occupational History  .      [employer] employee.    Social History Main Topics  . Smoking status: Former Smoker    Packs/day: 0.25    Quit date: 10/12/2005  . Smokeless tobacco: Never Used  . Alcohol use No  . Drug use: No  . Sexual activity: Yes    Birth control/ protection: IUD   Other Topics Concern  . Not on file   Social History Narrative   Widowed 2016- husband had pulmonary disease   2 kids.     Family History  Problem Relation Age of Onset  . Hypertension Mother   . Diabetes Mother   . Heart disease  Mother   . Cancer Maternal Grandfather     brain  . Heart attack Paternal Grandfather   . Anesthesia problems Neg Hx     Allergies  Allergen Reactions  . Vancomycin Other (See Comments)    Patient states that when this medication is given intravenously, it causes her kidney's to shut down.  . Neosporin [Neomycin-Polymyxin-Gramicidin] Other (See Comments)    Patient states that this medication causes a poison ivy rash like reaction.    Medication list reviewed and updated in full in [place].   GEN: No acute illnesses, no fevers, chills. GI: No n/v/d, eating normally Pulm: No SOB Interactive and getting along well at home.  Otherwise, ROS is as per the HPI.  Objective:   BP 110/80   Pulse 91   Temp 97.9 F  (36.6 C) (Oral)   Ht 5\' 7"  (1.702 m)   Wt (!) 323 lb 8 oz (146.7 kg)   BMI 50.67 kg/m   GEN: WDWN, NAD, Non-toxic, A & O x 3 HEENT: Atraumatic, Normocephalic. Neck supple. No masses, No LAD. Ears and Nose: No external deformity. CV: RRR, No M/G/R. No JVD. No thrill. No extra heart sounds. PULM: CTA B, no wheezes, crackles, rhonchi. No retractions. No resp. distress. No accessory muscle use. EXTR: No c/c/ 1+ edema NEURO Normal gait.  PSYCH: Normally interactive. Conversant. Not depressed or anxious appearing.  Calm demeanor.   Laboratory and Imaging Data: Results for orders placed or performed in visit on 08/05/16  D-dimer, quantitative (not at [facility])  Result Value Ref Range   D-Dimer, Quant 1.04 (H) <0.50 mcg/mL FEU  Comprehensive metabolic panel  Result Value Ref Range   Sodium 137 135 - 145 mEq/L   Potassium 4.2 3.5 - 5.1 mEq/L   Chloride 102 96 - 112 mEq/L   CO2 29 19 - 32 mEq/L   Glucose, Bld 99 70 - 99 mg/dL   BUN 8 6 - 23 mg/dL   Creatinine, Ser 0.65 0.40 - 1.20 mg/dL   Total Bilirubin 0.3 0.2 - 1.2 mg/dL   Alkaline Phosphatase 85 39 - 117 U/L   AST 21 0 - 37 U/L   ALT 13 0 - 35 U/L   Total Protein 6.9 6.0 - 8.3 g/dL   Albumin 3.8 3.5 - 5.2 g/dL   Calcium 8.9 8.4 - 10.5 mg/dL   GFR 108.51 >60.00 mL/min  TSH  Result Value Ref Range   TSH 2.23 0.35 - 4.50 uIU/mL  Brain natriuretic peptide  Result Value Ref Range   Pro B Natriuretic peptide (BNP) 35.0 0.0 - 100.0 pg/mL    Dg Chest 2 View  Result Date: 08/04/2016 CLINICAL DATA:  Bilateral leg swelling today. EXAM: CHEST  2 VIEW COMPARISON:  09/14/2014. FINDINGS: Normal sized heart. Clear lungs. Stable mildly elevated left hemidiaphragm. Thoracic spine degenerative changes. IMPRESSION: No acute abnormality. Electronically Signed   By: Claudie Revering M.D.   On: 08/04/2016 12:49   Ct Angio Chest W/cm &/or Wo Cm  Result Date: 08/06/2016 CLINICAL DATA:  Bilateral lower extremity swelling since Monday, elevated D-dimer.  Morbid obesity. Previous bariatric surgery. EXAM: CT ANGIOGRAPHY CHEST WITH CONTRAST TECHNIQUE: Multidetector CT imaging of the chest was performed using the standard protocol during bolus administration of intravenous contrast. Multiplanar CT image reconstructions and MIPs were obtained to evaluate the vascular anatomy. CONTRAST:  80 cc Isovue 370 COMPARISON:  06/14/2013, 02/13/2008 abdomen pelvis CT FINDINGS: Cardiovascular: Limited exam because of body habitus. No significant filling defect or pulmonary  embolus within the central and proximal hilar pulmonary arteries by CTA. Intact thoracic aorta. Three-vessel arch anatomy. Negative for aneurysm or dissection. No mediastinal hemorrhage or hematoma. Normal heart size. No pericardial effusion. Mediastinum/Nodes: No enlarged mediastinal, hilar, or axillary lymph nodes. Thyroid gland, trachea, and esophagus demonstrate no significant findings. Lungs/Pleura: Lungs are clear. No pleural effusion or pneumothorax. Upper Abdomen: Previous gastric bypass. Small hiatal hernia noted. No other acute upper abdominal finding. Musculoskeletal: Degenerative changes of the thoracic spine diffusely. Slight increased kyphotic curvature of the lower thoracic spine appearing chronic. No acute compression fracture. Intact sternum. Review of the MIP images confirms the above findings. IMPRESSION: No significant acute pulmonary embolus by CTA. No acute intra thoracic process. Small hiatal hernia Remote gastric bypass Electronically Signed   By: Jerilynn Mages.  Shick M.D.   On: 08/06/2016 15:16     Assessment and Plan:   Bilateral leg edema - Plan: VAS [place] LOWER EXTREMITY VENOUS (DVT)  Elevated d-dimer - Plan: VAS [place] LOWER EXTREMITY VENOUS (DVT)  uultrasound for DVT resulted in negative with no evidence of DVT.  Patient is alert.  Unclear etiology with multiple organ systems functioning perfectly.  Continue with compression stockings, elevate her feet at nighttime.  Follow-up: No  Follow-up on file.  Signed,  Maud Deed. Diallo Ponder, MD     Medication List       Accurate as of 08/07/16 11:59 PM. Always use your most recent med list.          albuterol 108 (90 Base) MCG/ACT inhaler Commonly known as:  PROVENTIL HFA;VENTOLIN HFA Inhale 2 puffs into the lungs every 4 (four) hours as needed for wheezing or shortness of breath.   cetirizine 10 MG tablet Commonly known as:  ZYRTEC Take 10 mg by mouth daily.   diazepam 10 MG tablet Commonly known as:  VALIUM Take 10 mg by mouth 3 (three) times daily as needed for anxiety or sleep.   fluticasone 50 MCG/ACT nasal spray Commonly known as:  FLONASE Place 1 spray into both nostrils daily as needed for allergies.   furosemide 20 MG tablet Commonly known as:  LASIX 1 tablet by mouth daily x 3 days.   ibuprofen 200 MG tablet Commonly known as:  ADVIL,MOTRIN Take 400-800 mg by mouth every 8 (eight) hours as needed for headache or moderate pain.   levonorgestrel 20 MCG/24HR IUD Commonly known as:  MIRENA 1 each by Intrauterine route once.   multivitamin with minerals tablet Take 1 tablet by mouth daily.   mupirocin ointment 2 % Commonly known as:  [place] 1 application into the nose as needed (for wound care).   PARoxetine 30 MG tablet Commonly known as:  PAXIL Take 30 mg by mouth daily.

## 2016-08-07 NOTE — Addendum Note (Signed)
Addended by: Marchia Bond on: 08/07/2016 09:28 AM   Modules accepted: Orders

## 2016-08-07 NOTE — Telephone Encounter (Signed)
Pt left /vm; pt had [place] for DVT and tech at hospital advised pt neg for DVT; pt wants to know since still has swelling in legs what should pt do. Pt request cb.

## 2016-08-07 NOTE — Telephone Encounter (Signed)
See lab test comment

## 2016-08-07 NOTE — Progress Notes (Signed)
Pre visit review using our clinic review tool, if applicable. No additional management support is needed unless otherwise documented below in the visit note. 

## 2016-08-08 MED ORDER — FUROSEMIDE 20 MG PO TABS: ORAL_TABLET | ORAL | 0 refills | Status: DC

## 2016-08-08 NOTE — Telephone Encounter (Signed)
See note

## 2016-08-08 NOTE — Addendum Note (Signed)
Addended by: Carter Kitten on: 08/08/2016 02:20 PM   Modules accepted: Orders

## 2016-08-12 DIAGNOSIS — F332 Major depressive disorder, recurrent severe without psychotic features: Secondary | ICD-10-CM | POA: Diagnosis not present

## 2016-09-11 DIAGNOSIS — F332 Major depressive disorder, recurrent severe without psychotic features: Secondary | ICD-10-CM | POA: Diagnosis not present

## 2016-10-09 DIAGNOSIS — F332 Major depressive disorder, recurrent severe without psychotic features: Secondary | ICD-10-CM | POA: Diagnosis not present

## 2016-10-30 ENCOUNTER — Telehealth: Payer: Self-pay | Admitting: Family Medicine

## 2016-10-30 NOTE — Telephone Encounter (Signed)
Patient Name: Meredith Castillo  DOB: Oct 27, 1980    Initial Comment NEEDS 2nd attempt after 1205 Caller states she is getting over the stomach flu, but still has some abdominal soreness, needs to know how long she needs to wait until her kids can come home.    Nurse Assessment  Nurse: Raphael Gibney, RN, Vanita Ingles Date/Time (Eastern Time): 10/30/2016 12:39:57 PM  Confirm and document reason for call. If symptomatic, describe symptoms. ---Caller states she is getting over the stomach flu. Her mother was in the hospital with norovirus on Friday. She was vomiting on Monday and it stopped after 12 hrs. Had diarrhea which has stopped. She is still having some abd soreness. Has not had a fever since Tuesday evening. Temp 100 on Tuesday.  Does the patient have any new or worsening symptoms? ---No  Please document clinical information provided and list any resource used. ---Advised caller as per [email or web address].[email or web address] that she is no longer contagious if she has been symptom free for 48 hrs. Verbalized understanding.     Guidelines    Guideline Title Affirmed Question Affirmed Notes       Final Disposition User   Clinical Call Keizer, RN, Vanita Ingles

## 2016-11-06 DIAGNOSIS — F332 Major depressive disorder, recurrent severe without psychotic features: Secondary | ICD-10-CM | POA: Diagnosis not present

## 2016-12-29 DIAGNOSIS — F332 Major depressive disorder, recurrent severe without psychotic features: Secondary | ICD-10-CM | POA: Diagnosis not present

## 2017-04-09 DIAGNOSIS — F332 Major depressive disorder, recurrent severe without psychotic features: Secondary | ICD-10-CM | POA: Diagnosis not present

## 2017-04-29 ENCOUNTER — Telehealth: Payer: Self-pay | Admitting: Family Medicine

## 2017-04-29 NOTE — Telephone Encounter (Signed)
Patient Name: Meredith Castillo DOB: October 09, 1980 Initial Comment Caller states, having nausea with watery yellow diarrhea. Nurse Assessment Nurse: Vallery Sa, RN, Cathy Date/Time (Eastern Time): 04/29/2017 2:17:57 PM Confirm and document reason for call. If symptomatic, describe symptoms. ---[name] states she developed diarrhea 3 nights ago (about 5 episodes in the past 24 hours). No vomiting or fever. No injury in the past 3 days. Alert and responsive. Does the patient have any new or worsening symptoms? ---Yes Will a triage be completed? ---Yes Related visit to physician within the last 2 weeks? ---No Does the PT have any chronic conditions? (i.e. diabetes, asthma, etc.) ---Yes List chronic conditions. ---Anxiety, Depression Is the patient pregnant or possibly pregnant? (Ask all females between the ages of 74-55) ---No Is this a behavioral health or substance abuse call? ---No Guidelines Guideline Title Affirmed Question Affirmed Notes Diarrhea [1] MODERATE diarrhea (e.g., 4-6 times / day more than normal) AND [2] present > 48 hours (2 days) Final Disposition User See Physician within Keizer, RN, Cathy Comments Scheduled for 8am appointment with Dr. Lorelei Pont 04/30/17. Referrals REFERRED TO PCP OFFICE Disagree/Comply: Comply

## 2017-04-29 NOTE — Telephone Encounter (Signed)
Pt has appt with Dr Lorelei Pont on 04/30/17 at 8 AM.

## 2017-04-30 ENCOUNTER — Encounter (INDEPENDENT_AMBULATORY_CARE_PROVIDER_SITE_OTHER): Payer: Self-pay

## 2017-04-30 ENCOUNTER — Encounter: Payer: Self-pay | Admitting: Family Medicine

## 2017-04-30 ENCOUNTER — Ambulatory Visit (INDEPENDENT_AMBULATORY_CARE_PROVIDER_SITE_OTHER): Payer: BLUE CROSS/BLUE SHIELD | Admitting: Family Medicine

## 2017-04-30 VITALS — BP 138/98 | HR 67 | Temp 98.4°F | Ht 67.0 in | Wt 288.0 lb

## 2017-04-30 DIAGNOSIS — A084 Viral intestinal infection, unspecified: Secondary | ICD-10-CM

## 2017-04-30 MED ORDER — PROMETHAZINE HCL 25 MG PO TABS: 25.0000 mg | ORAL_TABLET | Freq: Four times a day (QID) | ORAL | 2 refills | Status: DC | PRN

## 2017-04-30 NOTE — Progress Notes (Signed)
Dr. Frederico Hamman T. Wrigley Plasencia, MD, [facility] [place], [address] Phone: [phone] Fax: [phone]  04/30/2017  Patient: Meredith Castillo, MRN: [id number], DOB: [birth year], 36 y.o.  Primary Physician:  Owens Loffler, MD   Chief Complaint  Patient presents with  . Diarrhea    x 4 days  . Nausea  . Stomach Cramps   Subjective:   Meredith Castillo is a 36 y.o. very pleasant female patient who presents with the following:  Has been hving nausea and diarrhea for 4 days. ? Bad shirimp quesadilla. Woke up with severe cramps and diarrhea.   Gatorade, water, ginger ale. Also some foam with BM, yellow watery BM.   Bad stomach cramps.     Past Medical History, Surgical History, Social History, Family History, Problem List, Medications, and Allergies have been reviewed and updated if relevant.  Patient Active Problem List   Diagnosis Date Noted  . Bilateral leg edema 08/05/2016  . Cough 09/14/2014  . Pneumonia due to organism 09/14/2014  . Mild asthma 01/10/2014  . Allergic rhinitis 12/17/2012  . Recurrent sinus infections 12/17/2012  . H/O gastric bypass 12/17/2012  . PCOS (polycystic ovarian syndrome)   . Morbid obesity with BMI of 40.0-44.9, adult ([place])   . Iron deficiency anemia   . Vitamin B12 deficiency     Past Medical History:  Diagnosis Date  . Asthma   . Bariatric surgery status complicating pregnancy, childbirth, or the puerperium, antepartum condition or complication 1856  . HTN (hypertension)   . Iron deficiency anemia    due to gastric bypass.   . Mild asthma 01/10/2014  . Morbid obesity with BMI of 40.0-44.9, adult ([place])   . PCOS (polycystic ovarian syndrome)   . PP care - s/p SVD 2/19 11/19/2011  . Unspecified vitamin D deficiency   . Vitamin B12 deficiency    due to gastric bypass.     Past Surgical History:  Procedure Laterality Date  . APPENDECTOMY  2005  . GASTRIC BYPASS  2008   Rou-en-Y    . gynecologic surgery  2010   HSG  . INTRAUTERINE DEVICE INSERTION    . NASAL SEPTUM SURGERY  1996  . WISDOM TOOTH EXTRACTION      Social History   Social History  . Marital status: Single    Spouse name: N/A  . Number of children: 2  . Years of education: N/A   Occupational History  .      [employer] employee.    Social History Main Topics  . Smoking status: Former Smoker    Packs/day: 0.25    Quit date: 10/12/2005  . Smokeless tobacco: Never Used  . Alcohol use No  . Drug use: No  . Sexual activity: Yes    Birth control/ protection: IUD   Other Topics Concern  . Not on file   Social History Narrative   Widowed 2016- husband had pulmonary disease   2 kids.     Family History  Problem Relation Age of Onset  . Hypertension Mother   . Diabetes Mother   . Heart disease Mother   . Cancer Maternal Grandfather        brain  . Heart attack Paternal Grandfather   . Anesthesia problems Neg Hx     Allergies  Allergen Reactions  . Vancomycin Other (See Comments)    Patient states that when this medication is given intravenously, it causes her kidney's to  shut down.  . Neosporin [Neomycin-Polymyxin-Gramicidin] Other (See Comments)    Patient states that this medication causes a poison ivy rash like reaction.    Medication list reviewed and updated in full in [place].  ROS: GEN: Acute illness details above GI: Tolerating PO intake GU: maintaining adequate hydration and urination Pulm: No SOB Interactive and getting along well at home.  Otherwise, ROS is as per the HPI.  Objective:   BP (!) 138/98   Pulse 67   Temp 98.4 F (36.9 C) (Oral)   Ht 5\' 7"  (1.702 m)   Wt 288 lb (130.6 kg)   BMI 45.11 kg/m   GEN: WDWN, NAD, Non-toxic, A & O x 3 HEENT: Atraumatic, Normocephalic. Neck supple. No masses, No LAD. Ears and Nose: No external deformity. CV: RRR, No M/G/R. No JVD. No thrill. No extra heart sounds. PULM: CTA B, no wheezes, crackles,  rhonchi. No retractions. No resp. distress. No accessory muscle use. ABD: S, NT, ND, hyperactive BS. No rebound. No HSM. EXTR: No c/c/e NEURO Normal gait.  PSYCH: Normally interactive. Conversant. Not depressed or anxious appearing.  Calm demeanor.     Laboratory and Imaging Data:  Assessment and Plan:   Viral gastroenteritis  Supportive care  Follow-up: No Follow-up on file.  Meds ordered this encounter  Medications  . promethazine (PHENERGAN) 25 MG tablet    Sig: Take 1 tablet (25 mg total) by mouth every 6 (six) hours as needed for nausea or vomiting.    Dispense:  30 tablet    Refill:  2   Medications Discontinued During This Encounter  Medication Reason  . furosemide (LASIX) 20 MG tablet Completed Course   No orders of the defined types were placed in this encounter.   Signed,  Maud Deed. Kiasia Chou, MD   Allergies as of 04/30/2017      Reactions   Vancomycin Other (See Comments)   Patient states that when this medication is given intravenously, it causes her kidney's to shut down.   Neosporin [neomycin-polymyxin-gramicidin] Other (See Comments)   Patient states that this medication causes a poison ivy rash like reaction.      Medication List       Accurate as of 04/30/17  9:42 AM. Always use your most recent med list.          albuterol 108 (90 Base) MCG/ACT inhaler Commonly known as:  PROVENTIL HFA;VENTOLIN HFA Inhale 2 puffs into the lungs every 4 (four) hours as needed for wheezing or shortness of breath.   cetirizine 10 MG tablet Commonly known as:  ZYRTEC Take 10 mg by mouth daily.   diazepam 10 MG tablet Commonly known as:  VALIUM Take 10 mg by mouth 3 (three) times daily as needed for anxiety or sleep.   fluticasone 50 MCG/ACT nasal spray Commonly known as:  FLONASE Place 1 spray into both nostrils daily as needed for allergies.   ibuprofen 200 MG tablet Commonly known as:  ADVIL,MOTRIN Take 400-800 mg by mouth every 8 (eight) hours as needed  for headache or moderate pain.   levonorgestrel 20 MCG/24HR IUD Commonly known as:  MIRENA 1 each by Intrauterine route once.   multivitamin with minerals tablet Take 1 tablet by mouth daily.   mupirocin ointment 2 % Commonly known as:  [place] 1 application into the nose as needed (for wound care).   PARoxetine 30 MG tablet Commonly known as:  PAXIL Take 30 mg by mouth daily.   promethazine 25 MG  tablet Commonly known as:  PHENERGAN Take 1 tablet (25 mg total) by mouth every 6 (six) hours as needed for nausea or vomiting.

## 2017-05-07 DIAGNOSIS — F332 Major depressive disorder, recurrent severe without psychotic features: Secondary | ICD-10-CM | POA: Diagnosis not present

## 2017-05-20 DIAGNOSIS — Z130 Encounter for screening for diseases of the blood and blood-forming organs and certain disorders involving the immune mechanism: Secondary | ICD-10-CM | POA: Diagnosis not present

## 2017-05-20 DIAGNOSIS — E559 Vitamin D deficiency, unspecified: Secondary | ICD-10-CM | POA: Diagnosis not present

## 2017-05-20 DIAGNOSIS — Z0000 Encounter for general adult medical examination without abnormal findings: Secondary | ICD-10-CM | POA: Diagnosis not present

## 2017-05-20 DIAGNOSIS — Z6841 Body Mass Index (BMI) 40.0 and over, adult: Secondary | ICD-10-CM | POA: Diagnosis not present

## 2017-05-20 DIAGNOSIS — Z01419 Encounter for gynecological examination (general) (routine) without abnormal findings: Secondary | ICD-10-CM | POA: Diagnosis not present

## 2017-05-20 DIAGNOSIS — Z1321 Encounter for screening for nutritional disorder: Secondary | ICD-10-CM | POA: Diagnosis not present

## 2017-05-20 DIAGNOSIS — Z113 Encounter for screening for infections with a predominantly sexual mode of transmission: Secondary | ICD-10-CM | POA: Diagnosis not present

## 2017-05-20 DIAGNOSIS — Z1329 Encounter for screening for other suspected endocrine disorder: Secondary | ICD-10-CM | POA: Diagnosis not present

## 2017-05-20 DIAGNOSIS — Z118 Encounter for screening for other infectious and parasitic diseases: Secondary | ICD-10-CM | POA: Diagnosis not present

## 2017-05-20 DIAGNOSIS — E282 Polycystic ovarian syndrome: Secondary | ICD-10-CM | POA: Diagnosis not present

## 2017-06-09 DIAGNOSIS — F332 Major depressive disorder, recurrent severe without psychotic features: Secondary | ICD-10-CM | POA: Diagnosis not present

## 2017-06-19 DIAGNOSIS — F332 Major depressive disorder, recurrent severe without psychotic features: Secondary | ICD-10-CM | POA: Diagnosis not present

## 2017-07-07 DIAGNOSIS — F332 Major depressive disorder, recurrent severe without psychotic features: Secondary | ICD-10-CM | POA: Diagnosis not present

## 2017-09-14 DIAGNOSIS — F332 Major depressive disorder, recurrent severe without psychotic features: Secondary | ICD-10-CM | POA: Diagnosis not present

## 2017-11-13 ENCOUNTER — Other Ambulatory Visit: Payer: Self-pay | Admitting: Family Medicine

## 2017-11-13 NOTE — Telephone Encounter (Signed)
Last office visit 04/30/2017.  Last refilled 04/30/2017 for #30 with 2 refills.  Ok to refill?

## 2017-11-19 DIAGNOSIS — F332 Major depressive disorder, recurrent severe without psychotic features: Secondary | ICD-10-CM | POA: Diagnosis not present

## 2017-12-09 DIAGNOSIS — F332 Major depressive disorder, recurrent severe without psychotic features: Secondary | ICD-10-CM | POA: Diagnosis not present

## 2017-12-31 DIAGNOSIS — F332 Major depressive disorder, recurrent severe without psychotic features: Secondary | ICD-10-CM | POA: Diagnosis not present

## 2018-02-25 DIAGNOSIS — F332 Major depressive disorder, recurrent severe without psychotic features: Secondary | ICD-10-CM | POA: Diagnosis not present

## 2018-05-17 DIAGNOSIS — F332 Major depressive disorder, recurrent severe without psychotic features: Secondary | ICD-10-CM | POA: Diagnosis not present

## 2018-05-26 DIAGNOSIS — F332 Major depressive disorder, recurrent severe without psychotic features: Secondary | ICD-10-CM | POA: Diagnosis not present

## 2018-07-12 ENCOUNTER — Ambulatory Visit (INDEPENDENT_AMBULATORY_CARE_PROVIDER_SITE_OTHER): Payer: BLUE CROSS/BLUE SHIELD | Admitting: Mental Health

## 2018-07-12 DIAGNOSIS — F331 Major depressive disorder, recurrent, moderate: Secondary | ICD-10-CM | POA: Diagnosis not present

## 2018-07-12 DIAGNOSIS — F411 Generalized anxiety disorder: Secondary | ICD-10-CM | POA: Diagnosis not present

## 2018-07-12 NOTE — Progress Notes (Signed)
      [facility] Counselor/Therapist Progress Note   Patient ID: Meredith Castillo, MRN: [id number]  Date: 07/12/2018  Timespent: 45 minutes  Treatment Type: Individual  Subjective: Employment is not stressful. Some stress involved with placating [name]'s parents. Concerned about Emily's school performance, particularly reading. Bereavement and depression improved.    Interventions:CBT, Supportive and Family Systems  Mental Status Exam:   Appearance:   Well Groomed     Behavior:  Sharing  Motor:  Normal  Speech/Language:   Clear and Coherent  Affect:  Appropriate  Mood:  Euthymic with periods of anxiety  Thought process:  Coherent  Thought content:    WDL  Perceptual disturbances:    Normal  Orientation:  Full (Time, Place, and Person)  Attention:  Good  Concentration:  improved  Memory:  Immediate  Fund of knowledge:   Good  Insight:    Good  Judgment:   Good  Impulse Control:  good    Reported Symptoms:  Polite and cooperative, conversant. Episodes of anxiety, usually involving in-laws. 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 Sleep normal.   Risk Assessment: Danger to Self:  No Self-injurious Behavior: No Danger to Others: No Duty to Warn:no Physical Aggression / Violence:No  Access to Firearms a concern: No  Gang Involvement:No   Diagnosis:   ICD-10-CM   1. Major depressive disorder, recurrent episode, moderate (HCC) F33.1   2. Generalized anxiety disorder F41.1            Plan:          Maintain stable mood; continue to set boundaries for family members; self-care program including nutrition, restful sleep and exercise. Increase social outreach and opportunities.  [name], [place]

## 2018-08-17 DIAGNOSIS — F901 Attention-deficit hyperactivity disorder, predominantly hyperactive type: Secondary | ICD-10-CM | POA: Diagnosis not present

## 2018-08-24 DIAGNOSIS — F901 Attention-deficit hyperactivity disorder, predominantly hyperactive type: Secondary | ICD-10-CM | POA: Diagnosis not present

## 2018-09-01 DIAGNOSIS — F901 Attention-deficit hyperactivity disorder, predominantly hyperactive type: Secondary | ICD-10-CM | POA: Diagnosis not present

## 2018-09-13 ENCOUNTER — Encounter: Payer: Self-pay | Admitting: Mental Health

## 2018-09-13 ENCOUNTER — Ambulatory Visit (INDEPENDENT_AMBULATORY_CARE_PROVIDER_SITE_OTHER): Payer: BLUE CROSS/BLUE SHIELD | Admitting: Mental Health

## 2018-09-13 DIAGNOSIS — F332 Major depressive disorder, recurrent severe without psychotic features: Secondary | ICD-10-CM

## 2018-09-13 NOTE — Progress Notes (Signed)
      [facility] Counselor/Therapist Progress Note  Patient ID: Meredith Castillo, MRN: [id number],    Date: 09/13/2018  Time Spent: 45 minutes  Treatment Type: Individual Therapy  Reported Symptoms: Fatigue, adhedonia  Mental Status Exam:  Appearance:   Casual     Behavior:  Drowsy  Motor:  slow  Speech/Language:   Slow  Affect:  Depressed and Flat  Mood:  anxious, depressed, irritable, labile and sad  Thought process:  normal  Thought content:    Rumination  Sensory/Perceptual disturbances:    WNL  Orientation:  oriented to person, place and time/date  Attention:  Fair  Concentration:  Good  Memory:  WNL  Fund of knowledge:   Good  Insight:    Good  Judgment:   Good  Impulse Control:  Good   Risk Assessment: Danger to Self:  No Self-injurious Behavior: No Danger to Others: No Duty to Warn:no Physical Aggression / Violence:No  Access to Firearms a concern: No  Gang Involvement:No   Subjective: Downcast and fatigued. Anxious. No desire for Christmas activities. Sleeping okay, but needs more rest. Exhausted.   Interventions: Cognitive Behavioral Therapy, Solution-Oriented/Positive Psychology, Insight-Oriented, Interpersonal and supporive  Diagnosis:   ICD-10-CM   1. Severe recurrent major depression without psychotic features ([place]) F33.2     Plan: Boundaries and setting limits           Self care program           Parenting as widow and single Mom           Restore restful sleep  [name], [place]

## 2018-09-15 DIAGNOSIS — F901 Attention-deficit hyperactivity disorder, predominantly hyperactive type: Secondary | ICD-10-CM | POA: Diagnosis not present

## 2018-09-30 DIAGNOSIS — F901 Attention-deficit hyperactivity disorder, predominantly hyperactive type: Secondary | ICD-10-CM | POA: Diagnosis not present

## 2018-10-10 DIAGNOSIS — F3342 Major depressive disorder, recurrent, in full remission: Secondary | ICD-10-CM | POA: Insufficient documentation

## 2018-10-10 DIAGNOSIS — F411 Generalized anxiety disorder: Secondary | ICD-10-CM | POA: Insufficient documentation

## 2018-10-12 DIAGNOSIS — F901 Attention-deficit hyperactivity disorder, predominantly hyperactive type: Secondary | ICD-10-CM | POA: Diagnosis not present

## 2018-10-26 DIAGNOSIS — F901 Attention-deficit hyperactivity disorder, predominantly hyperactive type: Secondary | ICD-10-CM | POA: Diagnosis not present

## 2018-11-10 ENCOUNTER — Ambulatory Visit: Payer: BLUE CROSS/BLUE SHIELD | Admitting: Psychiatry

## 2018-11-10 DIAGNOSIS — F901 Attention-deficit hyperactivity disorder, predominantly hyperactive type: Secondary | ICD-10-CM | POA: Diagnosis not present

## 2018-11-15 ENCOUNTER — Ambulatory Visit: Payer: BLUE CROSS/BLUE SHIELD | Admitting: Psychiatry

## 2018-11-15 ENCOUNTER — Ambulatory Visit: Payer: BLUE CROSS/BLUE SHIELD | Admitting: Mental Health

## 2018-11-15 ENCOUNTER — Encounter: Payer: Self-pay | Admitting: Psychiatry

## 2018-11-15 ENCOUNTER — Encounter: Payer: Self-pay | Admitting: Mental Health

## 2018-11-15 VITALS — BP 132/90 | HR 80 | Ht 67.5 in | Wt 307.0 lb

## 2018-11-15 DIAGNOSIS — F331 Major depressive disorder, recurrent, moderate: Secondary | ICD-10-CM

## 2018-11-15 DIAGNOSIS — F3342 Major depressive disorder, recurrent, in full remission: Secondary | ICD-10-CM

## 2018-11-15 DIAGNOSIS — F411 Generalized anxiety disorder: Secondary | ICD-10-CM

## 2018-11-15 MED ORDER — TRAZODONE HCL 50 MG PO TABS: 50.0000 mg | ORAL_TABLET | Freq: Every day | ORAL | 3 refills | Status: DC

## 2018-11-15 MED ORDER — PAROXETINE HCL 30 MG PO TABS: 30.0000 mg | ORAL_TABLET | Freq: Every day | ORAL | 3 refills | Status: DC

## 2018-11-15 MED ORDER — DIAZEPAM 10 MG PO TABS
10.0000 mg | ORAL_TABLET | Freq: Two times a day (BID) | ORAL | 1 refills | Status: DC | PRN
Start: 2018-11-15 — End: 2019-05-16

## 2018-11-15 NOTE — Progress Notes (Signed)
      [facility] Counselor/Therapist Progress Note  Patient ID: Meredith Castillo, MRN: [id number],    Date: 11/15/2018  Time Spent: 45 minutes  Treatment Type: Individual Therapy  Reported Symptoms: Depressed mood, Anxious Mood, Fatigue and Sleep disturbance  Mental Status Exam:  Appearance:   tired, obese     Behavior:  Appropriate and Sharing  Motor:  slow  Speech/Language:   Slow  Affect:  Depressed and Flat  Mood:  anxious and depressed  Thought process:  normal  Thought content:    WNL  Sensory/Perceptual disturbances:    WNL  Orientation:  oriented to person, place and time/date  Attention:  Fair  Concentration:  Good  Memory:  WNL  Fund of knowledge:   Good  Insight:    Good  Judgment:   Good  Impulse Control:  Good   Risk Assessment: Danger to Self:  No Self-injurious Behavior: No Danger to Others: No Duty to Warn:no Physical Aggression / Violence:No  Access to Firearms a concern: No  Gang Involvement:No   Subjective:  Streissors of single parenting. Parents of deceased husband are helpful but demanding.Sleep interrupted.  Job stressors  With new software and equipment. Feels in-laws are hostile and critical. Sad and anxious as well as sick with cold and cough. Tired.  Interventions: Cognitive Behavioral Therapy, Solution-Oriented/Positive Psychology, Insight-Oriented, Family Systems and Interpersonal  Diagnosis:   ICD-10-CM   1. Major depressive disorder, recurrent episode, moderate (HCC) F33.1     Plan:  Boundaries and setting limits with in-laws            Self care program            Single parenting, widow            Validation and support  [name], [place]

## 2018-11-15 NOTE — Progress Notes (Signed)
[facility] Med Check  Patient ID: Meredith Castillo,  MRN: [id number]  PCP: Owens Loffler, MD  Date of Evaluation: 11/15/2018 Time spent:20 minutes  Chief Complaint:  Chief Complaint    Anxiety; Depression      HISTORY/CURRENT STATUS: [name] is seen individually face-to-face with consent with therapy collateral for psychiatric interview and exam and 25-month evaluation and management of anxiety and depression with cluster C defenses.  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  She is more methodical and assertive in her interaction with the grandparents of the daughter of she and deceased fianc [name].  She discloses today that she resides in a residence owned by his parents as she anticipates that their planned look at her finances will require her to pay them, in which case she will likely move to a residence with some distance from family who regularly impinge in a controlling way on the life of she and children.  She appreciates their help and love but is highly stressed by their control with high expressed emotion.  Mourning seems complete as she now attends therapy every 2 months including today with [name], LPC.  She is unable to sleep now despite Paxil and Valium, usually taking Valium approximately twice daily at most and Paxil at bedtime both well-tolerated and helpful but lately unable to sleep.  She and the children have both been sick with various viruses.  She denies self-harm, suicide risk, dangerousness to others, mania, or psychosis.  She has no substance use and has limited social life of her own.  Depression       The patient presents with depression.  This is a recurrent problem.  The current episode started more than 1 year ago.   The onset quality is sudden.   The problem occurs intermittently.  The problem has been gradually improving since onset.  Associated  symptoms include decreased concentration, fatigue and insomnia.  Associated symptoms include no helplessness, no hopelessness, not irritable, no restlessness, no decreased interest, no appetite change, not sad and no suicidal ideas.     The symptoms are aggravated by work stress, social issues and family issues.  Past treatments include SSRIs - Selective serotonin reuptake inhibitors, other medications and psychotherapy.  Compliance with treatment is good.  Past compliance problems include difficulty with treatment plan, medication issues and medical issues.  Risk factors include family history, family history of mental illness, history of mental illness, major life event and stress.   Past medical history includes chronic illness, anxiety, depression and mental health disorder.     Pertinent negatives include no life-threatening condition, no recent psychiatric admission, no bipolar disorder, no eating disorder, no obsessive-compulsive disorder, no post-traumatic stress disorder, no schizophrenia, no suicide attempts and no head trauma.   Individual Medical History/ Review of Systems: Changes? :Yes PCOS, gastric bypass for obesity, and iron and vitamin D deficiencies continue with no new medical concerns except weight is up 7 pounds.  Burden of illness with consideration of cluster C defenses is moderate today and burden of treatment is moderate.  Allergies: Vancomycin and Neosporin [neomycin-polymyxin-gramicidin]  Current Medications:  Current Outpatient Medications:  .  albuterol (PROVENTIL HFA;VENTOLIN HFA) 108 (90 BASE) MCG/ACT inhaler, Inhale 2 puffs into the lungs every 4 (four) hours as needed for wheezing or shortness of breath., Disp: 1 Inhaler, Rfl: 3 .  cetirizine (ZYRTEC) 10 MG tablet, Take 10 mg by mouth daily.,  Disp: , Rfl:  .  diazepam (VALIUM) 10 MG tablet, Take 1 tablet (10 mg total) by mouth 2 (two) times daily as needed for anxiety., Disp: 180 tablet, Rfl: 1 .  fluticasone (FLONASE)  50 MCG/ACT nasal spray, Place 1 spray into both nostrils daily as needed for allergies., Disp: , Rfl:  .  ibuprofen (ADVIL,MOTRIN) 200 MG tablet, Take 400-800 mg by mouth every 8 (eight) hours as needed for headache or moderate pain. , Disp: , Rfl:  .  levonorgestrel (MIRENA) 20 MCG/24HR IUD, 1 each by Intrauterine route once., Disp: , Rfl:  .  Multiple Vitamins-Minerals (MULTIVITAMIN WITH MINERALS) tablet, Take 1 tablet by mouth daily., Disp: , Rfl:  .  mupirocin ointment (BACTROBAN) 2 %, Place 1 application into the nose as needed (for wound care)., Disp: , Rfl:  .  PARoxetine (PAXIL) 30 MG tablet, Take 1 tablet (30 mg total) by mouth daily at 10 pm., Disp: 90 tablet, Rfl: 3 .  promethazine (PHENERGAN) 25 MG tablet, TAKE 1 TABLET BY MOUTH EVERY 6 HOURS AS NEEDED FOR NAUSEA AND VOMITING, Disp: 30 tablet, Rfl: 2 .  traZODone (DESYREL) 50 MG tablet, Take 1 tablet (50 mg total) by mouth at bedtime., Disp: 90 tablet, Rfl: 3 Medication Side Effects: none  Family Medical/ Social History: Changes? Yes nephew has schizophrenia not otherwise on her side of the family mother rather the other family side except her father has Asperger's symptoms.  MENTAL HEALTH EXAM: Muscle strengths and tone 5/5, postural reflexes and gait 0/0, and AIMS = 0. Blood pressure 132/90, pulse 80, height 5' 7.5" (1.715 m), weight (!) 307 lb (139.3 kg).Body mass index is 47.37 kg/m.  General Appearance: Casual, Fairly Groomed, Guarded and Obese  Eye Contact:  Fair  Speech:  Clear and Coherent and Normal Rate  Volume:  Normal  Mood:  Anxious, Dysphoric, Euthymic and Worthless  Affect:  Appropriate, Constricted and Anxious  Thought Process:  Goal Directed and Linear  Orientation:  Full (Time, Place, and Person)  Thought Content: Rumination   Suicidal Thoughts:  No  Homicidal Thoughts:  No  Memory:  Immediate;   Good Remote;   Good  Judgement:  Good  Insight:  Fair  Psychomotor Activity:  Decreased  Concentration:   Concentration: Fair and Attention Span: Good  Recall:  Good  Fund of Knowledge: Good  Language: Good  Assets:  Resilience Social Support Talents/Skills  ADL's:  Intact  Cognition: WNL  Prognosis:  Good    DIAGNOSES:    ICD-10-CM   1. Major depressive disorder, recurrent episode, in full remission ([place]) F33.42 PARoxetine (PAXIL) 30 MG tablet    diazepam (VALIUM) 10 MG tablet    traZODone (DESYREL) 50 MG tablet  2. Generalized anxiety disorder F41.1 PARoxetine (PAXIL) 30 MG tablet    diazepam (VALIUM) 10 MG tablet    traZODone (DESYREL) 50 MG tablet    Receiving Psychotherapy: Yes  [name], LPC   RECOMMENDATIONS: Trazodone is processed as her best current option for insomnia, having the experience of elevated blood pressure in the past when taking Ambien.  Trazodone is E scribed 50 mg every bedtime sent to [facility] as a 90-day supply and 3 refills for anxious and depressive insomnia.  Paxil is renewed sent as escription 30 mg every bedtime #90 with 3 refills to [facility] for major depression and generalized anxiety.  Valium 10 mg twice daily as needed for generalized anxiety is E scribed #180 with 1 refill to [facility].  She  returns in 6 months and is educated on diagnoses, treatment including trazodone, and integrationion with therapy for warnings and risk, safety hygiene, and crisis plans if needed.   Delight Hoh, MD

## 2018-12-01 ENCOUNTER — Encounter: Payer: Self-pay | Admitting: Primary Care

## 2018-12-01 ENCOUNTER — Ambulatory Visit (INDEPENDENT_AMBULATORY_CARE_PROVIDER_SITE_OTHER): Payer: BLUE CROSS/BLUE SHIELD | Admitting: Primary Care

## 2018-12-01 VITALS — BP 126/78 | HR 89 | Temp 98.8°F | Ht 67.5 in | Wt 296.5 lb

## 2018-12-01 DIAGNOSIS — H538 Other visual disturbances: Secondary | ICD-10-CM | POA: Insufficient documentation

## 2018-12-01 DIAGNOSIS — B0052 Herpesviral keratitis: Secondary | ICD-10-CM | POA: Diagnosis not present

## 2018-12-01 DIAGNOSIS — F901 Attention-deficit hyperactivity disorder, predominantly hyperactive type: Secondary | ICD-10-CM | POA: Diagnosis not present

## 2018-12-01 NOTE — Assessment & Plan Note (Addendum)
Also with discomfort.  Fluorescein evaluation today without obvious abrasion. No obvious infection or conjunctivitis.  No evidence of shingles but given history of HSV symptoms are suspicious. Given blurred vision coupled with pain we will get her in with optometry today. Urgent referral placed.

## 2018-12-01 NOTE — Patient Instructions (Signed)
Stop by the front desk and speak with Rosaria Ferries regarding your referral to the eye doctor.  It was a pleasure meeting you!

## 2018-12-01 NOTE — Progress Notes (Addendum)
Subjective:    Patient ID: Meredith Castillo, female    DOB: 1980/12/19, 38 y.o.   MRN: [id number]  HPI  Meredith Castillo is a 38 year old female with a history of allergic rhinitis who presents today with a chief complaint of eye irritation.  She woke up with this morning with redness, blurry vision, discomfort, and sensation of foreign object to right eye. Also with clear drainage. She does have a history of fever blisters and had one occur several days ago. Overall her symptoms have improved with the exception of blurry vision and discomfort.   She is not taking or using anything OTC, sick contacts, other URI symptoms, itching, sneezing, eyes matted shut. She denies rash to the side of her face. She's not been outdoors for prolonged periods of time.   Review of Systems  HENT: Negative for rhinorrhea, sneezing and sore throat.   Eyes: Positive for photophobia, pain, redness and visual disturbance. Negative for discharge and itching.  Respiratory: Negative for choking.   Allergic/Immunologic: Negative for environmental allergies.       Past Medical History:  Diagnosis Date  . Asthma   . Bariatric surgery status complicating pregnancy, childbirth, or the puerperium, antepartum condition or complication 3646  . HTN (hypertension)   . Iron deficiency anemia    due to gastric bypass.   . Mild asthma 01/10/2014  . Morbid obesity with BMI of 40.0-44.9, adult ([place])   . PCOS (polycystic ovarian syndrome)   . PP care - s/p SVD 2/19 11/19/2011  . Unspecified vitamin D deficiency   . Vitamin B12 deficiency    due to gastric bypass.      Social History   Socioeconomic History  . Marital status: Single    Spouse name: Not on file  . Number of children: 2  . Years of education: Not on file  . Highest education level: Not on file  Occupational History    Comment: [employer] employee.   Social Needs  . Financial resource strain: Not on file  . Food insecurity:    Worry: Not on file   Inability: Not on file  . Transportation needs:    Medical: Not on file    Non-medical: Not on file  Tobacco Use  . Smoking status: Former Smoker    Packs/day: 0.25    Last attempt to quit: 10/12/2005    Years since quitting: 13.1  . Smokeless tobacco: Never Used  Substance and Sexual Activity  . Alcohol use: No    Alcohol/week: 0.0 standard drinks  . Drug use: Yes  . Sexual activity: Not Currently    Partners: Male    Birth control/protection: I.U.D.    Comment: widow  Lifestyle  . Physical activity:    Days per week: Not on file    Minutes per session: Not on file  . Stress: Not on file  Relationships  . Social connections:    Talks on phone: Not on file    Gets together: Not on file    Attends religious service: Not on file    Active member of club or organization: Not on file    Attends meetings of clubs or organizations: Not on file    Relationship status: Not on file  . Intimate partner violence:    Fear of current or ex partner: Not on file    Emotionally abused: Not on file    Physically abused: Not on file    Forced sexual activity: Not on file  Other Topics Concern  . Not on file  Social History Narrative   Widowed 2016- husband had pulmonary disease   2 kids.     Past Surgical History:  Procedure Laterality Date  . APPENDECTOMY  2005  . GASTRIC BYPASS  2008   Rou-en-Y  . gynecologic surgery  2010   HSG  . INTRAUTERINE DEVICE INSERTION    . NASAL SEPTUM SURGERY  1996  . WISDOM TOOTH EXTRACTION      Family History  Problem Relation Age of Onset  . Hypertension Mother   . Diabetes Mother   . Heart disease Mother   . Cancer Maternal Grandfather        brain  . Heart attack Paternal Grandfather   . Anesthesia problems Neg Hx     Allergies  Allergen Reactions  . Vancomycin Other (See Comments)    Patient states that when this medication is given intravenously, it causes her kidney's to shut down.  . Neosporin [Neomycin-Polymyxin-Gramicidin]  Other (See Comments)    Patient states that this medication causes a poison ivy rash like reaction.    Current Outpatient Medications on File Prior to Visit  Medication Sig Dispense Refill  . albuterol (PROVENTIL HFA;VENTOLIN HFA) 108 (90 BASE) MCG/ACT inhaler Inhale 2 puffs into the lungs every 4 (four) hours as needed for wheezing or shortness of breath. 1 Inhaler 3  . cetirizine (ZYRTEC) 10 MG tablet Take 10 mg by mouth daily.    . diazepam (VALIUM) 10 MG tablet Take 1 tablet (10 mg total) by mouth 2 (two) times daily as needed for anxiety. 180 tablet 1  . fluticasone (FLONASE) 50 MCG/ACT nasal spray Place 1 spray into both nostrils daily as needed for allergies.    Marland Kitchen ibuprofen (ADVIL,MOTRIN) 200 MG tablet Take 400-800 mg by mouth every 8 (eight) hours as needed for headache or moderate pain.     Marland Kitchen levonorgestrel (MIRENA) 20 MCG/24HR IUD 1 each by Intrauterine route once.    . Multiple Vitamins-Minerals (MULTIVITAMIN WITH MINERALS) tablet Take 1 tablet by mouth daily.    . mupirocin ointment (BACTROBAN) 2 % Place 1 application into the nose as needed (for wound care).    Marland Kitchen PARoxetine (PAXIL) 30 MG tablet Take 1 tablet (30 mg total) by mouth daily at 10 pm. 90 tablet 3  . promethazine (PHENERGAN) 25 MG tablet TAKE 1 TABLET BY MOUTH EVERY 6 HOURS AS NEEDED FOR NAUSEA AND VOMITING 30 tablet 2  . traZODone (DESYREL) 50 MG tablet Take 1 tablet (50 mg total) by mouth at bedtime. 90 tablet 3   No current facility-administered medications on file prior to visit.     BP 126/78   Pulse 89   Temp 98.8 F (37.1 C) (Oral)   Ht 5' 7.5" (1.715 m)   Wt 296 lb 8 oz (134.5 kg)   SpO2 99%   BMI 45.75 kg/m    Objective:   Physical Exam  Constitutional: She appears well-nourished.  Eyes:  Slit lamp exam:      The right eye shows no corneal abrasion, no foreign body and no fluorescein uptake.       The left eye shows no corneal abrasion, no foreign body and no fluorescein uptake.  No obvious  foreign body or abrasion. Very slight irritation. Good peripheral field of vision.  Cardiovascular: Normal rate.  Respiratory: Effort normal.  Skin: Skin is warm and dry.           Assessment & Plan:

## 2019-01-14 ENCOUNTER — Ambulatory Visit (INDEPENDENT_AMBULATORY_CARE_PROVIDER_SITE_OTHER): Payer: BLUE CROSS/BLUE SHIELD | Admitting: Mental Health

## 2019-01-14 ENCOUNTER — Encounter: Payer: Self-pay | Admitting: Mental Health

## 2019-01-14 ENCOUNTER — Other Ambulatory Visit: Payer: Self-pay

## 2019-01-14 DIAGNOSIS — F331 Major depressive disorder, recurrent, moderate: Secondary | ICD-10-CM

## 2019-01-14 NOTE — Progress Notes (Signed)
[facility] Counselor/Therapist Progress Note  Patient ID: DAFNE NIELD, MRN: [id number],    Date: 01/14/2019  Time Spent:  50 minutes  I connected with patient by a video enabled telemedicine application or telephone, with their informed consent, and verified patient privacy and that I am speaking with the correct person using two identifiers.  I was located at home and patient at home..   Treatment Type: Individual Therapy  Reported Symptoms:  Recovered from flu with all the characteristics of the Corona Virus. Her daughter Raquel Sarna age 40 had it also. Anniversary of husband's death triggered some depression. Has been anxious.  Mental Status Exam:  Appearance:   unseen - telephone     Behavior:  Appropriate and Sharing  Motor:  Restlestness as reported  Speech/Language:   Clear and Coherent  Affect:  Appropriate  Mood:  anxious  Thought process:  goal directed  Thought content:    WNL  Sensory/Perceptual disturbances:    WNL  Orientation:  oriented to person, place and time/date  Attention:  Good  Concentration:  Good  Memory:  WNL  Fund of knowledge:   Good  Insight:    Good  Judgment:   Good  Impulse Control:  Good   Risk Assessment: Danger to Self:  No Self-injurious Behavior: No Danger to Others: No Duty to Warn:no Physical Aggression / Violence:No  Access to Firearms a concern: No  Gang Involvement:No   Subjective:  Has been sick with flu along with daughter Raquel Sarna. Father in law had it also. Will be tested for Corona  Antibodies. 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 Some anxiety and depression due to anniversary of husband's death recently. Has some positive anticipations for the summer. Able to enjoy.   Interventions: Solution-Oriented/Positive Psychology, Insight-Oriented, Interpersonal and supportive  Diagnosis:   ICD-10-CM   1. Major depressive disorder, recurrent episode,  moderate [facility]) F33.1       Treatment Plan   Patient Name:  Golden Emile  Date:  January 14, 2019   Didactic topic to be discussed:           Anxiety:                   Locus of control                              Work/Life balance           Depression                             Problem-solving                              Relationships                                   Boundaries                                     Coping srategies                             Communication  Recovery from trauma                    Self-care                                     Validation  Other     Goals:  Patient  1. Maintains mood stabiity:  decreased symptoms of     depression     anxiety  2.   Practices pro-active self-care:   restful sleep, nutrition, exercise, socialization  3.   Effective utilizes boundaries and sets limits  4.   Utliizes coping strategies and problem solving techniques for stress management  5.   Feels accurately heard, understood and validated  Other      Logan Bores Bryant, [facility]

## 2019-01-20 ENCOUNTER — Other Ambulatory Visit: Payer: Self-pay

## 2019-01-20 ENCOUNTER — Ambulatory Visit (INDEPENDENT_AMBULATORY_CARE_PROVIDER_SITE_OTHER)
Admission: RE | Admit: 2019-01-20 | Discharge: 2019-01-20 | Disposition: A | Discharge status: Home / Self Care | Payer: BLUE CROSS/BLUE SHIELD | Source: Ambulatory Visit | Attending: Family Medicine | Admitting: Family Medicine

## 2019-01-20 ENCOUNTER — Encounter: Payer: Self-pay | Admitting: Family Medicine

## 2019-01-20 ENCOUNTER — Ambulatory Visit
Admission: RE | Admit: 2019-01-20 | Discharge: 2019-01-20 | Disposition: A | Discharge status: Home / Self Care | Payer: BLUE CROSS/BLUE SHIELD | Source: Ambulatory Visit | Attending: Family Medicine | Admitting: Family Medicine

## 2019-01-20 ENCOUNTER — Ambulatory Visit (INDEPENDENT_AMBULATORY_CARE_PROVIDER_SITE_OTHER): Payer: BLUE CROSS/BLUE SHIELD | Admitting: Family Medicine

## 2019-01-20 VITALS — BP 154/96 | HR 105 | Temp 98.7°F | Ht 67.5 in | Wt 294.2 lb

## 2019-01-20 DIAGNOSIS — S3991XA Unspecified injury of abdomen, initial encounter: Secondary | ICD-10-CM

## 2019-01-20 DIAGNOSIS — R1012 Left upper quadrant pain: Secondary | ICD-10-CM

## 2019-01-20 DIAGNOSIS — M545 Low back pain, unspecified: Secondary | ICD-10-CM

## 2019-01-20 DIAGNOSIS — M546 Pain in thoracic spine: Secondary | ICD-10-CM

## 2019-01-20 DIAGNOSIS — R1032 Left lower quadrant pain: Secondary | ICD-10-CM

## 2019-01-20 DIAGNOSIS — W19XXXA Unspecified fall, initial encounter: Secondary | ICD-10-CM | POA: Diagnosis not present

## 2019-01-20 DIAGNOSIS — R0789 Other chest pain: Secondary | ICD-10-CM

## 2019-01-20 DIAGNOSIS — R0781 Pleurodynia: Secondary | ICD-10-CM

## 2019-01-20 DIAGNOSIS — S3992XA Unspecified injury of lower back, initial encounter: Secondary | ICD-10-CM | POA: Diagnosis not present

## 2019-01-20 DIAGNOSIS — S299XXA Unspecified injury of thorax, initial encounter: Secondary | ICD-10-CM | POA: Diagnosis not present

## 2019-01-20 LAB — CBC WITH DIFFERENTIAL/PLATELET
Basophils Absolute: 0 10*3/uL (ref 0.0–0.1)
Basophils Relative: 0.6 % (ref 0.0–3.0)
Eosinophils Absolute: 0.2 10*3/uL (ref 0.0–0.7)
Eosinophils Relative: 2.3 % (ref 0.0–5.0)
HCT: 40.3 % (ref 36.0–46.0)
Hemoglobin: 13.3 g/dL (ref 12.0–15.0)
Lymphocytes Relative: 18.5 % (ref 12.0–46.0)
Lymphs Abs: 1.5 10*3/uL (ref 0.7–4.0)
MCHC: 32.9 g/dL (ref 30.0–36.0)
MCV: 90.1 fl (ref 78.0–100.0)
Monocytes Absolute: 0.5 10*3/uL (ref 0.1–1.0)
Monocytes Relative: 6.4 % (ref 3.0–12.0)
Neutro Abs: 5.7 10*3/uL (ref 1.4–7.7)
Neutrophils Relative %: 72.2 % (ref 43.0–77.0)
Platelets: 284 10*3/uL (ref 150.0–400.0)
RBC: 4.48 Mil/uL (ref 3.87–5.11)
RDW: 18.1 % — ABNORMAL HIGH (ref 11.5–15.5)
WBC: 7.9 10*3/uL (ref 4.0–10.5)

## 2019-01-20 LAB — BASIC METABOLIC PANEL
BUN: 9 mg/dL (ref 6–23)
CO2: 28 mEq/L (ref 19–32)
Calcium: 8.6 mg/dL (ref 8.4–10.5)
Chloride: 100 mEq/L (ref 96–112)
Creatinine, Ser: 0.76 mg/dL (ref 0.40–1.20)
GFR: 84.16 mL/min (ref >60.00)
Glucose, Bld: 93 mg/dL (ref 70–99)
Potassium: 4 mEq/L (ref 3.5–5.1)
Sodium: 136 mEq/L (ref 135–145)

## 2019-01-20 LAB — HEPATIC FUNCTION PANEL
ALT: 14 U/L (ref 0–35)
AST: 17 U/L (ref 0–37)
Albumin: 4.1 g/dL (ref 3.5–5.2)
Alkaline Phosphatase: 76 U/L (ref 39–117)
Bilirubin, Direct: 0.1 mg/dL (ref 0.0–0.3)
Total Bilirubin: 0.5 mg/dL (ref 0.2–1.2)
Total Protein: 6.7 g/dL (ref 6.0–8.3)

## 2019-01-20 MED ORDER — IOPAMIDOL (ISOVUE-300) INJECTION 61%
100.0000 mL | Freq: Once | INTRAVENOUS | Status: AC | PRN
  Administered 2019-01-20: 13:00:00 100 mL via INTRAVENOUS

## 2019-01-20 MED ORDER — HYDROCODONE-ACETAMINOPHEN 5-325 MG PO TABS: 1.0000 | ORAL_TABLET | Freq: Four times a day (QID) | ORAL | 0 refills | Status: DC | PRN

## 2019-01-20 NOTE — Addendum Note (Signed)
Addended by: Owens Loffler on: 01/20/2019 01:39 PM   Modules accepted: Orders

## 2019-01-20 NOTE — Progress Notes (Addendum)
Meredith Castillo T. Meredith Wesch, MD Primary Care and [place] at [facility] [place], [address] Phone: [phone]  FAX: [phone]  Meredith Castillo - 38 y.o. female  MRN [id number]  Date of Birth: [birth year]  Visit Date: 01/20/2019  PCP: Meredith Loffler, MD  Referred by: Meredith Loffler, MD  Chief Complaint  Patient presents with  . Fall    last night-hit kitchen counter then fell in floor  . Flank Pain    Left   Subjective:   Meredith Castillo is a 38 y.o. very pleasant female patient who presents with the following:  Well-known patient who fell last night and hit her back and flank.  DOI 01/19/2019  The patient fell last night and she struck her kitchen counter on the left flank, and then she struck her back, and she was in such severe pain that she was unable to move for approximately 5 minutes.  Today in the office she appears to be in severe pain and she is having pain throughout the entirety of her thoracic spine, lumbar spine, as well as her abdomen and flank.  She is hardly able to stand.  She is having severe pain even to minimal touch.  She also has some onset of bruising.  She is breathing comfortably.  L flank is the worst, Hurts in the abdomen, hurts in the back.   Back is really bad now, too. Hurting in the abd, too.   Lower t-spine through l-spine, not sacrum  abd grossly NT    Past Medical History, Surgical History, Social History, Family History, Problem List, Medications, and Allergies have been reviewed and updated if relevant.  Patient Active Problem List   Diagnosis Date Noted  . Blurred vision, right eye 12/01/2018  . Major depressive disorder, recurrent episode, in full remission ([place]) 10/10/2018  . Generalized anxiety disorder 10/10/2018  . Bilateral leg edema 08/05/2016  . Allergic rhinitis 12/17/2012  . H/O gastric bypass 12/17/2012  . PCOS (polycystic ovarian syndrome)   . Morbid  obesity with BMI of 40.0-44.9, adult ([place])   . Vitamin B12 deficiency     Past Medical History:  Diagnosis Date  . Asthma   . Bariatric surgery status complicating pregnancy, childbirth, or the puerperium, antepartum condition or complication 7062  . HTN (hypertension)   . Iron deficiency anemia    due to gastric bypass.   . Mild asthma 01/10/2014  . Morbid obesity with BMI of 40.0-44.9, adult ([place])   . PCOS (polycystic ovarian syndrome)   . PP care - s/p SVD 2/19 11/19/2011  . Unspecified vitamin D deficiency   . Vitamin B12 deficiency    due to gastric bypass.     Past Surgical History:  Procedure Laterality Date  . APPENDECTOMY  2005  . GASTRIC BYPASS  2008   Rou-en-Y  . gynecologic surgery  2010   HSG  . INTRAUTERINE DEVICE INSERTION    . NASAL SEPTUM SURGERY  1996  . WISDOM TOOTH EXTRACTION      Social History   Socioeconomic History  . Marital status: Single    Spouse name: Not on file  . Number of children: 2  . Years of education: Not on file  . Highest education level: Not on file  Occupational History    Comment: [employer] employee.   Social Needs  . Financial resource strain: Not on file  . Food insecurity:    Worry: Not on file  Inability: Not on file  . Transportation needs:    Medical: Not on file    Non-medical: Not on file  Tobacco Use  . Smoking status: Former Smoker    Packs/day: 0.25    Last attempt to quit: 10/12/2005    Years since quitting: 13.2  . Smokeless tobacco: Never Used  Substance and Sexual Activity  . Alcohol use: No    Alcohol/week: 0.0 standard drinks  . Drug use: Yes  . Sexual activity: Not Currently    Partners: Male    Birth control/protection: I.U.D.    Comment: widow  Lifestyle  . Physical activity:    Days per week: Not on file    Minutes per session: Not on file  . Stress: Not on file  Relationships  . Social connections:    Talks on phone: Not on file    Gets together: Not on file    Attends religious  service: Not on file    Active member of club or organization: Not on file    Attends meetings of clubs or organizations: Not on file    Relationship status: Not on file  . Intimate partner violence:    Fear of current or ex partner: Not on file    Emotionally abused: Not on file    Physically abused: Not on file    Forced sexual activity: Not on file  Other Topics Concern  . Not on file  Social History Narrative   Widowed 2016- husband had pulmonary disease   2 kids.     Family History  Problem Relation Age of Onset  . Hypertension Mother   . Diabetes Mother   . Heart disease Mother   . Cancer Maternal Grandfather        brain  . Heart attack Paternal Grandfather   . Anesthesia problems Neg Hx     Allergies  Allergen Reactions  . Vancomycin Other (See Comments)    Patient states that when this medication is given intravenously, it causes her kidney's to shut down.  . Neosporin [Neomycin-Polymyxin-Gramicidin] Other (See Comments)    Patient states that this medication causes a poison ivy rash like reaction.    Medication list reviewed and updated in full in [place].  GEN: No fevers, chills. Nontoxic. Primarily MSK c/o today. MSK: Detailed in the HPI. Severe pain. No head injury GI: tolerating PO intake without difficulty Neuro: No numbness, parasthesias, or tingling associated. Otherwise the pertinent positives of the ROS are noted above.   Objective:   BP (!) 154/96   Pulse (!) 105   Temp 98.7 F (37.1 C) (Oral)   Ht 5' 7.5" (1.715 m)   Wt 294 lb 4 oz (133.5 kg)   SpO2 99%   BMI 45.41 kg/m    GEN: WDWN, NAD, Non-toxic, Alert & Oriented x 3 HEENT: Atraumatic, Normocephalic.  Ears and Nose: No external deformity. EXTR: No clubbing/cyanosis/edema NEURO: markedly impaired gait, antalgia PSYCH: Normally interactive. Conversant. Not depressed or anxious appearing.  Tearful.  Dramatic pain to palpation throughout the lowest aspects of the thoracic  spine midline as well through the entirety of the lumbar spine, excluding the sacrum.  Paraspinous tenderness diffusely.  Neurovascularly intact, but the remainder of the thoracic and lumbar spine is limited secondary to severe pain.  Chest wall, sternum is nontender, right ribs are nontender.  Excruciating pain to palpation on the left lower 4-5 ribs. This portion of the physical examination was chaperoned by Hedy Camara, CMA.  Abdominal exam is relatively soft but pain limits palpation.  She is quite tender and significantly tender in the left upper quadrant region and to a lesser extent the LLQ.  Epigastric region is nontender, right upper quadrant is minimally tender.  Right lower quadrant is minimally tender..  No distention.  Bowel sounds are good.   Radiology: Dg Ribs Unilateral Left  Result Date: 01/20/2019 CLINICAL DATA:  Pain following fall EXAM: CHEST AND LEFT RIBS - 3+ VIEW COMPARISON:  Chest radiograph August 04, 2016 and chest CT August 06, 2016 FINDINGS: Frontal chest as well as oblique and cone-down rib images were obtained. Lungs are clear. Heart size and pulmonary vascularity are normal. No adenopathy. No evident pneumothorax or pleural effusion. No fracture appreciable. IMPRESSION: No demonstrable rib fracture.  Lungs clear. Electronically Signed   By: Lowella Grip III M.D.   On: 01/20/2019 11:08   Dg Thoracic Spine W/swimmers  Result Date: 01/20/2019 CLINICAL DATA:  Pain following fall EXAM: THORACIC SPINE - 3 VIEWS COMPARISON:  Chest CT with bony reformats August 06, 2016 FINDINGS: Frontal, lateral, and swimmer's views were obtained. Mild anterior wedging of the T11 vertebral body is chronic and stable. No acute fracture evident. No spondylolisthesis. There is disc space narrowing at multiple levels. Multiple anterior osteophytes are noted in the lower thoracic region. No erosive change or paraspinous lesions. IMPRESSION: Chronic anterior wedging at T11, stable with  remodeling. No acute fracture. No spondylolisthesis. Osteoarthritic change at multiple levels noted. Electronically Signed   By: Lowella Grip III M.D.   On: 01/20/2019 11:12   Dg Lumbar Spine Complete  Result Date: 01/20/2019 CLINICAL DATA:  Pain following fall EXAM: LUMBAR SPINE - COMPLETE 4+ VIEW COMPARISON:  CT abdomen and pelvis with bony reformats June 14, 2013 FINDINGS: Frontal, lateral, spot lumbosacral lateral, and bilateral oblique views were obtained. There are 5 non-rib-bearing lumbar type vertebral bodies. Mild anterior wedging is noted at T11, stable from 2014. There is no evident lumbar fracture. No spondylolisthesis. There is slight disc space narrowing at L5-S1. Other lumbar discs appear unremarkable. No appreciable facet arthropathy. An intrauterine device is present within the pelvis. IMPRESSION: Mild anterior wedging of the T11 vertebral body, chronic. No lumbar fracture. No spondylolisthesis. Mild disc space narrowing at L5-S1. Intrauterine device present within the pelvis. Electronically Signed   By: Lowella Grip III M.D.   On: 01/20/2019 11:10   Ct Abdomen Pelvis W Contrast  Result Date: 01/20/2019 CLINICAL DATA:  Blunt abdominal trauma EXAM: CT ABDOMEN AND PELVIS WITH CONTRAST TECHNIQUE: Multidetector CT imaging of the abdomen and pelvis was performed using the standard protocol following bolus administration of intravenous contrast. CONTRAST:  128mL ISOVUE-300 IOPAMIDOL (ISOVUE-300) INJECTION 61% COMPARISON:  06/14/2013 FINDINGS: Lower chest: No acute abnormality. Hepatobiliary: No focal liver abnormality is seen. No gallstones, gallbladder wall thickening, or biliary dilatation. Pancreas: Unremarkable. No pancreatic ductal dilatation or surrounding inflammatory changes. Spleen: Normal in size without focal abnormality. Adrenals/Urinary Tract: Normal adrenal glands. Normal right kidney. 2 cm hypodense, fluid attenuating left renal mass consistent with a cyst. No  urolithiasis or obstructive uropathy. Unremarkable bladder. Stomach/Bowel: Prior gastric bypass. No bowel wall thickening, bowel dilatation or inflammatory changes. Prior appendectomy. Vascular/Lymphatic: No significant vascular findings are present. No enlarged abdominal or pelvic lymph nodes. Reproductive: Uterus and bilateral adnexa are unremarkable. Other: No abdominal wall hernia or abnormality. No abdominopelvic ascites. Musculoskeletal: No acute or significant osseous findings. IMPRESSION: 1. No acute abdominal or pelvic pathology. Electronically Signed   By: Kathreen Devoid  On: 01/20/2019 13:25     Assessment and Plan:   Blunt abdominal trauma, initial encounter - Plan: Hepatic function panel, CT Abdomen Pelvis W Contrast  Acute bilateral low back pain without sciatica - Plan: DG Lumbar Spine Complete, Basic metabolic panel, CBC with Differential/Platelet, CANCELED: CT Abdomen Pelvis W Contrast  Acute bilateral thoracic back pain - Plan: DG Thoracic Spine W/Swimmers, Basic metabolic panel, CBC with Differential/Platelet, CANCELED: CT Abdomen Pelvis W Contrast  Rib pain on left side - Plan: DG Ribs Unilateral Left, Basic metabolic panel, CBC with Differential/Platelet, CANCELED: CT Abdomen Pelvis W Contrast  Abdominal pain, acute, left upper quadrant - Plan: Basic metabolic panel, CBC with Differential/Platelet, Hepatic function panel, CT Abdomen Pelvis W Contrast, CANCELED: CT Abdomen Pelvis W Contrast  Fall, initial encounter - Plan: CT Abdomen Pelvis W Contrast  Left lower quadrant abdominal pain - Plan: Hepatic function panel, CT Abdomen Pelvis W Contrast  >40 minutes spent in face to face time with patient, >50% spent in counselling or coordination of care   Markedly impaired patient with severe left-sided flank pain, thoracic through lumbar spine pain, as well as left upper quadrant pain, and all of these are severe in character.  Such severe pain on exam, then even mild palpation  causes extreme pain for the patient.  No hematuria.  Check CBC and renal function.  Given the severity of the patient's pain, I am going to check stat thoracic, lumbar as well as rib x-rays here in the office.  Given the patient's abdominal pain acutely after trauma in the left upper quadrant region and LLQ, we will obtain a stat CT of the abdomen and pelvis to rule out potential splenic laceration or renal injury or other intraabdominal traumatic injury.  Other intra-abdominal traumatic pathology cannot be excluded.  This needs to be obtained urgently today.  Imaging is needed urgently.  Plan of care will be determined by above imaging processes.  General surgery or Orthopedic Surgical consultation may be needed in this case.   Addendum, 1:39 PM 01/20/19  Imaging all normal Spoke with patient Send in [place] 5, #20  Orders Placed This Encounter  Procedures  . DG Lumbar Spine Complete  . DG Thoracic Spine W/Swimmers  . DG Ribs Unilateral Left  . CT Abdomen Pelvis W Contrast  . Basic metabolic panel  . CBC with Differential/Platelet  . Hepatic function panel    Signed,  Frederico Hamman T. Nataley Bahri, MD   Outpatient Encounter Medications as of 01/20/2019  Medication Sig  . albuterol (PROVENTIL HFA;VENTOLIN HFA) 108 (90 BASE) MCG/ACT inhaler Inhale 2 puffs into the lungs every 4 (four) hours as needed for wheezing or shortness of breath.  . cetirizine (ZYRTEC) 10 MG tablet Take 10 mg by mouth daily.  . diazepam (VALIUM) 10 MG tablet Take 1 tablet (10 mg total) by mouth 2 (two) times daily as needed for anxiety.  . fluticasone (FLONASE) 50 MCG/ACT nasal spray Place 1 spray into both nostrils daily as needed for allergies.  Marland Kitchen ibuprofen (ADVIL,MOTRIN) 200 MG tablet Take 400-800 mg by mouth every 8 (eight) hours as needed for headache or moderate pain.   Marland Kitchen levonorgestrel (MIRENA) 20 MCG/24HR IUD 1 each by Intrauterine route once.  . Multiple Vitamins-Minerals (MULTIVITAMIN WITH MINERALS)  tablet Take 1 tablet by mouth daily.  . mupirocin ointment (BACTROBAN) 2 % Place 1 application into the nose as needed (for wound care).  Marland Kitchen PARoxetine (PAXIL) 30 MG tablet Take 1 tablet (30 mg total) by mouth daily at  10 pm.  . promethazine (PHENERGAN) 25 MG tablet TAKE 1 TABLET BY MOUTH EVERY 6 HOURS AS NEEDED FOR NAUSEA AND VOMITING  . traZODone (DESYREL) 50 MG tablet Take 1 tablet (50 mg total) by mouth at bedtime.  Marland Kitchen HYDROcodone-acetaminophen (NORCO/VICODIN) 5-325 MG tablet Take 1 tablet by mouth every 6 (six) hours as needed for up to 5 days for moderate pain or severe pain.   No facility-administered encounter medications on file as of 01/20/2019.

## 2019-01-21 ENCOUNTER — Encounter: Payer: Self-pay | Admitting: *Deleted

## 2019-01-24 ENCOUNTER — Telehealth: Payer: Self-pay | Admitting: Family Medicine

## 2019-01-24 MED ORDER — HYDROCODONE-ACETAMINOPHEN 5-325 MG PO TABS: 1.0000 | ORAL_TABLET | Freq: Four times a day (QID) | ORAL | 0 refills | Status: AC | PRN

## 2019-01-24 NOTE — Telephone Encounter (Signed)
See OV notes

## 2019-01-24 NOTE — Telephone Encounter (Signed)
Patient was seen last week by Dr.Copland.  Patient said she's in excruciating pain. Pain goes from the left side and all the way around to her back.  Patient said she can't sleep and she can't get comfortable while she's awake.  Patient said Dr.Copland told her if she's still in pain after 5 days, he would send her in another rx for pain medication.  Patient uses [facility].

## 2019-02-07 ENCOUNTER — Telehealth: Payer: Self-pay | Admitting: Family Medicine

## 2019-02-07 MED ORDER — HYDROCODONE-ACETAMINOPHEN 5-325 MG PO TABS: 1.0000 | ORAL_TABLET | Freq: Four times a day (QID) | ORAL | 0 refills | Status: AC | PRN

## 2019-02-07 NOTE — Telephone Encounter (Signed)
I refilled her hydrocodone.  At this point, she should be getting better and have decreased need for pain medication.  If it continues, I want to recheck her in the office.

## 2019-02-07 NOTE — Telephone Encounter (Signed)
Lesleyann notified as instructed by telephone.

## 2019-02-07 NOTE — Telephone Encounter (Signed)
Best number [phone]  Pt called wanting  to see if you could refill her hydrocodone  [facility]  Pt stated she is still having significant pain.  She is having problem sleeping  She can only sleep on right side of body.  She still is having a lot of pain where the spleen is and all around to the back of her body on left side.  Do you want pt to come in office?

## 2019-05-03 DIAGNOSIS — Z03818 Encounter for observation for suspected exposure to other biological agents ruled out: Secondary | ICD-10-CM | POA: Diagnosis not present

## 2019-05-16 ENCOUNTER — Ambulatory Visit (INDEPENDENT_AMBULATORY_CARE_PROVIDER_SITE_OTHER): Payer: BC Managed Care – PPO | Admitting: Psychiatry

## 2019-05-16 ENCOUNTER — Other Ambulatory Visit: Payer: Self-pay

## 2019-05-16 ENCOUNTER — Encounter: Payer: Self-pay | Admitting: Psychiatry

## 2019-05-16 DIAGNOSIS — F3342 Major depressive disorder, recurrent, in full remission: Secondary | ICD-10-CM | POA: Diagnosis not present

## 2019-05-16 DIAGNOSIS — F411 Generalized anxiety disorder: Secondary | ICD-10-CM | POA: Diagnosis not present

## 2019-05-16 DIAGNOSIS — E282 Polycystic ovarian syndrome: Secondary | ICD-10-CM

## 2019-05-16 MED ORDER — DIAZEPAM 10 MG PO TABS: 10.0000 mg | ORAL_TABLET | Freq: Two times a day (BID) | ORAL | 1 refills | Status: DC | PRN

## 2019-05-16 NOTE — Progress Notes (Signed)
[facility]  Patient ID: Meredith Castillo,  MRN: [id number]  PCP: Owens Loffler, MD  Date of Evaluation: 05/16/2019 Time spent:20 minutes from 1540 to 1600  Chief Complaint:  Chief Complaint    Depression; Anxiety; Trauma      HISTORY/CURRENT STATUS: Makinzey is provided telemedicine audiovisual appointment session, declining video camera as she has 2 daughters on Zoom learning for their first day of school finding privacy in her bedroom, individually with consent with epic collateral for psychiatric interview and exam in 50-month evaluation and management of major depression and generalized anxiety with polycystic ovary associated somatic consequences including obesity. Primary care monitors her blood pressure for weight having been 126/78 in March when seen for an eye problem then 154/96 in April when seen for abdominal injury.  She has gained proximately 25 to 30 pounds since fianc's death, and she restarted Paxil after attempts here to cross her medication over to Cymbalta than Zoloft without success.  She had previous gastric bypass and has Mirena IUD for PCOS.  Therapy has been with [name] doing best when seen every 2 months though now having 4 months since last appointment.  She is aware that her therapist has retired from this office but still works in Games developer.  She is active in scouts and school for her 2 daughters.  She is currently stressed as she is the only employee doing her job at work but [employer] is hiring an expanding so that she has increased responsibilities providing for yearly bonus.  Paternal grandparents of the youngest daughter paid a surprise visit after their 2 funerals for which they were unable to babysit so the patient could come to this appointment.  They own the home in which patient resides and are still impatient and controllingly complaining so that she needed Valium after their complaints.  Her own father has offered backup of  residence for her should she be required to move out by the parents of her deceased fianc.  She has no mania, suicidality, psychosis, or delirium.  Depression         The patient presents with depression as a recurrent problem.  The current episode of more than 1 year ago has not recurred despite environmental stressors and diathesis..   The onset quality is sudden.   The problem occurs intermittently.  The problem is now in resolution.  Associated symptoms include decreased concentration, fatigue and insomnia.  Associated symptoms include no helplessness, no hopelessness, not irritable, no restlessness, no decreased interest, no appetite change, not sad and no suicidal ideas.     The symptoms are aggravated by work stress, social issues and family issues.  Past treatments include SSRIs - Selective serotonin reuptake inhibitors, other medications and psychotherapy.  Compliance with treatment is good.  Past compliance problems include difficulty with treatment plan, medication issues and medical issues.  Risk factors include family history, family history of mental illness, history of mental illness, major life event and stress.   Past medical history includes chronic illness, anxiety, depression and mental health disorder.     Pertinent negatives include no life-threatening condition, no recent psychiatric admission, no bipolar disorder, no eating disorder, no obsessive-compulsive disorder, no post-traumatic stress disorder, no schizophrenia, no suicide attempts and no head trauma.  Individual Medical History/ Review of Systems: Changes? :Yes With history of asthma, gastric bypass consequences, PCOS, and borderline hypertension with obesity.  Allergies: Vancomycin and Neosporin [neomycin-polymyxin-gramicidin]  Current Medications:  Current Outpatient Medications:  .  albuterol (PROVENTIL HFA;VENTOLIN HFA) 108 (90 BASE) MCG/ACT inhaler, Inhale 2 puffs into the lungs every 4 (four) hours as needed for  wheezing or shortness of breath., Disp: 1 Inhaler, Rfl: 3 .  cetirizine (ZYRTEC) 10 MG tablet, Take 10 mg by mouth daily., Disp: , Rfl:  .  diazepam (VALIUM) 10 MG tablet, Take 1 tablet (10 mg total) by mouth 2 (two) times daily as needed for anxiety., Disp: 180 tablet, Rfl: 1 .  fluticasone (FLONASE) 50 MCG/ACT nasal spray, Place 1 spray into both nostrils daily as needed for allergies., Disp: , Rfl:  .  HYDROcodone-acetaminophen (NORCO/VICODIN) 5-325 MG tablet, Take 1 tablet by mouth every 6 (six) hours as needed for moderate pain or severe pain., Disp: 30 tablet, Rfl: 0 .  ibuprofen (ADVIL,MOTRIN) 200 MG tablet, Take 400-800 mg by mouth every 8 (eight) hours as needed for headache or moderate pain. , Disp: , Rfl:  .  levonorgestrel (MIRENA) 20 MCG/24HR IUD, 1 each by Intrauterine route once., Disp: , Rfl:  .  Multiple Vitamins-Minerals (MULTIVITAMIN WITH MINERALS) tablet, Take 1 tablet by mouth daily., Disp: , Rfl:  .  mupirocin ointment (BACTROBAN) 2 %, Place 1 application into the nose as needed (for wound care)., Disp: , Rfl:  .  PARoxetine (PAXIL) 30 MG tablet, Take 1 tablet (30 mg total) by mouth daily at 10 pm., Disp: 90 tablet, Rfl: 3 .  promethazine (PHENERGAN) 25 MG tablet, TAKE 1 TABLET BY MOUTH EVERY 6 HOURS AS NEEDED FOR NAUSEA AND VOMITING, Disp: 30 tablet, Rfl: 2 .  traZODone (DESYREL) 50 MG tablet, Take 1 tablet (50 mg total) by mouth at bedtime., Disp: 90 tablet, Rfl: 3   Medication Side Effects: weight gain  Family Medical/ Social History: Changes? Yes the paternal grandparents of her youngest child remain controlling but helpful patient describing several days of stress after there unexpected visit mobilizing disapproval's as though they will retaliate.  MENTAL HEALTH EXAM:  There were no vitals taken for this visit.There is no height or weight on file to calculate BMI.  As not present in the office today  General Appearance: Casual, Guarded, Well Groomed and Obese and  meticulous  Eye Contact:  N/A  Speech:  N/A  Volume:  Normal  Mood:  Anxious, Dysphoric, Euthymic and Worthless  Affect:  Appropriate, Congruent, Full Range and Anxious  Thought Process:  Coherent, Goal Directed and Irrelevant  Orientation:  Full (Time, Place, and Person)  Thought Content: Obsessions and Rumination   Suicidal Thoughts:  No  Homicidal Thoughts:  No  Memory:  Immediate;   Good Remote;   Good  Judgement:  Good  Insight:  Fair  Psychomotor Activity:  Normal, Decreased and Mannerisms  Concentration:  Concentration: Good and Attention Span: Good  Recall:  Good  Fund of Knowledge: Good  Language: Good  Assets:  Desire for Improvement Leisure Time Resilience Talents/Skills Vocational/Educational  ADL's:  Intact  Cognition: WNL  Prognosis:  Good    DIAGNOSES:    ICD-10-CM   1. Major depressive disorder, recurrent episode, in full remission ([place])  F33.42 diazepam (VALIUM) 10 MG tablet  2. Generalized anxiety disorder  F41.1 diazepam (VALIUM) 10 MG tablet  3. PCOS (polycystic ovarian syndrome)  E28.2     Receiving Psychotherapy: Yes [name], [facility] though could transfer to Shanon Ace, LCSW in this office if needed for insurance   RECOMMENDATIONS: Over 50% of the time is spent in counseling and coordination of care updating interval of conflict avoidance and resolution  she attempts over the years to transition that more successful style.  She reviews behavioral and relational management for loss and resulting trauma with children well-adjusted and work very successful despite her intrapsychic continuation of past distress.  Need for 3 medications continues with appropriate use.  She has Paxil 30 mg every bedtime having meaning 6 months of year supply with [facility] from last appointment anxiety and depression.  She is E scribed trazodone 50 mg every bedtime as #90 with 3 refills for insomnia and generalized anxiety sent to [facility].  Valium use is appropriate  E scribed today as 10 mg twice daily as needed for anxiety #180 with 1 refill sent to [facility] with [place] registry appropriate.  She returns in 6 months for follow-up or sooner if needed.  Virtual Visit via Video Note  I connected with ADALIE MAND on 05/16/19 at  3:40 PM EDT by a video enabled telemedicine application and verified that I am speaking with the correct person using two identifiers.  Location: Patient: Individually and her sleeping quarters at home residence daughters in online school Provider: [facility]   I discussed the limitations of evaluation and management by telemedicine and the availability of in person appointments. The patient expressed understanding and agreed to proceed.  History of Present Illness: 29-month evaluation and management address major depression and generalized anxiety with polycystic ovary associated somatic consequences including obesity   Observations/Objective: Mood:  Anxious, Dysphoric, Euthymic and Worthless  Affect:  Appropriate, Congruent, Full Range and Anxious  Thought Process:  Coherent, Goal Directed and Irrelevant  Orientation:  Full (Time, Place, and Person)  Thought Content: Obsessions and Rumination    Assessment and Plan: Over 50% of the time is spent in counseling and coordination of care updating interval of conflict avoidance and resolution she attempts over the years to transition that more successful style.  She reviews behavioral and relational management for loss and resulting trauma with children well-adjusted and work very successful despite her intrapsychic continuation of past distress.  Need for 3 medications continues with appropriate use.  She has Paxil 30 mg every bedtime having meaning 6 months of year supply with [facility] from last appointment anxiety and depression.  She is E scribed trazodone 50 mg every bedtime as #90 with 3 refills for insomnia and generalized anxiety sent to [facility].  Valium use is appropriate E scribed today as 10 mg twice daily as needed for anxiety #180 with 1 refill sent to [facility] with [place] registry appropriate.   Follow Up Instructions: She returns in 6 months for follow-up or sooner if needed.    I discussed the assessment and treatment plan with the patient. The patient was provided an opportunity to ask questions and all were answered. The patient agreed with the plan and demonstrated an understanding of the instructions.   The patient was advised to call back or seek an in-person evaluation if the symptoms worsen or if the condition fails to improve as anticipated.  I provided 20 minutes of non-face-to-face time during this encounter. [employer] meeting #[id number] Meeting password: [email or web address]  Delight Hoh, MD  Delight Hoh, MD

## 2019-08-17 ENCOUNTER — Other Ambulatory Visit: Payer: Self-pay

## 2019-08-17 ENCOUNTER — Encounter: Payer: Self-pay | Admitting: Psychiatry

## 2019-08-17 ENCOUNTER — Ambulatory Visit (INDEPENDENT_AMBULATORY_CARE_PROVIDER_SITE_OTHER): Payer: BC Managed Care – PPO | Admitting: Psychiatry

## 2019-08-17 VITALS — Ht 67.5 in | Wt 296.0 lb

## 2019-08-17 DIAGNOSIS — F50819 Binge eating disorder, unspecified: Secondary | ICD-10-CM

## 2019-08-17 DIAGNOSIS — F411 Generalized anxiety disorder: Secondary | ICD-10-CM

## 2019-08-17 DIAGNOSIS — F5081 Binge eating disorder: Secondary | ICD-10-CM

## 2019-08-17 DIAGNOSIS — F3342 Major depressive disorder, recurrent, in full remission: Secondary | ICD-10-CM

## 2019-08-17 MED ORDER — LISDEXAMFETAMINE DIMESYLATE 40 MG PO CAPS: 40.0000 mg | ORAL_CAPSULE | Freq: Every day | ORAL | 0 refills | Status: DC

## 2019-08-17 NOTE — Progress Notes (Signed)
[facility]  Patient ID: Meredith Castillo,  MRN: [id number]  PCP: Owens Loffler, MD  Date of Evaluation: 08/17/2019 Time spent:20 minutes from 1520 to 27   Chief Complaint:  Chief Complaint    Depression; Anxiety; Eating Disorder      HISTORY/CURRENT STATUS: Meredith Castillo is seen onsite in office 20 minutes face-to-face with consent individually with collateral from epic for psychiatric interview and exam in acute appointment for conflicts with grandparents of her youngest daughter about house organization and cleaning as some question whether she may need treatment for ADHD.  Patient was last seen 3 months ago for her major depression and generalized anxiety complicating her PCOS, much more depressive and anxious following the death of her fianc.  The fianc's parents providence and some support for the the family but are very controlling and critical, at times patient feeling they want her to move out so that she keeps a backup plan with her family of origin.  The patient has 1 daughter with ADHD but she does not clarify various postures of the family on the best way to look at these conflicts but rather wonders whether she may have ADHD as well.  She has had bariatric surgery for obesity helping with 100 pounds weight reduction still remaining 45 on BMI but reasonably stable in that.  Whether depression or the binge overeating contribute to the difficulties with using the in-laws cannot be immediately clarified.  She feels she needs all the assistance and improvement she can gather including for her work which is going well and for raising her children alone as a single parent now.  She has no mania, psychosis, suicidality or delirium.  Depression         The patient presents withdepression as a recurrentproblem.The current episode started more than 1 year ago has not recurred despite environmental stressors and diathesis. The onset quality is sudden. The problem occurs  intermittently.The problem is now in resolution.Associated symptoms include appetite change, decreased concentration,fatigueand insomnia. Associated symptoms include no helplessness,no hopelessness,not irritable,no restlessness,no decreased interest,not sadand no suicidal ideas.The symptoms are aggravated by work stress, social issues and family issues.Past treatments include SSRIs - Selective serotonin reuptake inhibitors, other medications and psychotherapy.Compliance with treatment is good.Past compliance problems include difficulty with treatment plan, medication issues and medical issues.Risk factors include family history, family history of mental illness, history of mental illness, major life event and stress. Past medical history includes chronic illness,anxiety,depressionand mental health disorder. Pertinent negatives include no life-threatening condition,no recent psychiatric admission,no bipolar disorder,no eating disorder,no obsessive-compulsive disorder,no post-traumatic stress disorder,no schizophrenia,no suicide attemptsand no head trauma.  Individual Medical History/ Review of Systems: Changes? :Yes Patient had last blood pressure in this office 11/15/2018 of 132/90 having one telemedicine session here since then.  When having blurry vision seeing the internist 12/01/2018, blood pressure was 126/78 and when seeing doctor for blunt abdominal trauma 01/20/2019 with pain, BP was 154/96.  She therefore runs BP at the upper side of normal likely with her severe obesity needing to get weight down to be able to improve including likely for depression.  Allergies: Vancomycin and Neosporin [neomycin-polymyxin-gramicidin]  Current Medications:  Current Outpatient Medications:  .  albuterol (PROVENTIL HFA;VENTOLIN HFA) 108 (90 BASE) MCG/ACT inhaler, Inhale 2 puffs into the lungs every 4 (four) hours as needed for wheezing or shortness of breath., Disp: 1 Inhaler,  Rfl: 3 .  cetirizine (ZYRTEC) 10 MG tablet, Take 10 mg by mouth daily., Disp: , Rfl:  .  diazepam (VALIUM)  10 MG tablet, Take 1 tablet (10 mg total) by mouth 2 (two) times daily as needed for anxiety., Disp: 180 tablet, Rfl: 1 .  fluticasone (FLONASE) 50 MCG/ACT nasal spray, Place 1 spray into both nostrils daily as needed for allergies., Disp: , Rfl:  .  HYDROcodone-acetaminophen (NORCO/VICODIN) 5-325 MG tablet, Take 1 tablet by mouth every 6 (six) hours as needed for moderate pain or severe pain., Disp: 30 tablet, Rfl: 0 .  ibuprofen (ADVIL,MOTRIN) 200 MG tablet, Take 400-800 mg by mouth every 8 (eight) hours as needed for headache or moderate pain. , Disp: , Rfl:  .  levonorgestrel (MIRENA) 20 MCG/24HR IUD, 1 each by Intrauterine route once., Disp: , Rfl:  .  lisdexamfetamine (VYVANSE) 40 MG capsule, Take 1 capsule (40 mg total) by mouth daily after breakfast., Disp: 30 capsule, Rfl: 0 .  Multiple Vitamins-Minerals (MULTIVITAMIN WITH MINERALS) tablet, Take 1 tablet by mouth daily., Disp: , Rfl:  .  mupirocin ointment (BACTROBAN) 2 %, Place 1 application into the nose as needed (for wound care)., Disp: , Rfl:  .  PARoxetine (PAXIL) 30 MG tablet, Take 1 tablet (30 mg total) by mouth daily at 10 pm., Disp: 90 tablet, Rfl: 3 .  promethazine (PHENERGAN) 25 MG tablet, TAKE 1 TABLET BY MOUTH EVERY 6 HOURS AS NEEDED FOR NAUSEA AND VOMITING, Disp: 30 tablet, Rfl: 2 .  traZODone (DESYREL) 50 MG tablet, Take 1 tablet (50 mg total) by mouth at bedtime., Disp: 90 tablet, Rfl: 3   Medication Side Effects: none  Family Medical/ Social History: Changes? Yes daughter takes [facility] and methylamine IR having mean temperament on Adderall.  Patient is therefore always preferred to avoid Adderall.  Patient is using Valium infrequently now but continues her Paxil well without change.  MENTAL HEALTH EXAM:  Height 5' 7.5" (1.715 m), weight 296 lb (134.3 kg).Body mass index is 45.68 kg/m. Muscle strengths and  tone 5/5, postural reflexes and gait 0/0, and AIMS = 0 others deferred for coronavirus shutdown.  General Appearance: Casual, Guarded, Meticulous and Obese  Eye Contact:  Good  Speech:  Clear and Coherent, Normal Rate and Talkative  Volume:  Normal  Mood:  Anxious, Dysphoric, Euthymic and Worthless  Affect:  Congruent, Inappropriate, Restricted and Anxious  Thought Process:  Coherent, Goal Directed, Irrelevant and Descriptions of Associations: Tangential  Orientation:  Full (Time, Place, and Person)  Thought Content: Ilusions, Rumination and Tangential   Suicidal Thoughts:  No  Homicidal Thoughts:  No  Memory:  Immediate;   Fair Remote;   Good  Judgement:  Fair  Insight:  Good and Fair  Psychomotor Activity:  Normal, Decreased and Mannerisms  Concentration:  Concentration: Fair and Attention Span: Good  Recall:  [employer] of Knowledge: Good  Language: Good  Assets:  Leisure Time Resilience Talents/Skills  ADL's:  Intact  Cognition: WNL  Prognosis:  Fair    DIAGNOSES:    ICD-10-CM   1. Major depressive disorder, recurrent episode, in full remission ([place])  F33.42 lisdexamfetamine (VYVANSE) 40 MG capsule  2. Generalized anxiety disorder  F41.1   3. Binge eating disorder  F50.81 lisdexamfetamine (VYVANSE) 40 MG capsule    Receiving Psychotherapy: Yes previously [name], [facility] now to consider Shanon Ace, LCSW   RECOMMENDATIONS: Over 50% of the 20-minute face-to-face time for a total of 10 minutes is spent in counseling and coordination of care as patient attempts to correlate somewhat lifelong modest at best housekeeping and personal /ADL maintenance that she dates back  to her latency years.  She states she still leaves things lying around in their household in the form of a relative mass which irritates the grandparents of her daughter who own the house.  The patient maintains that if her daughter has ADHD she may also need similar treatment though noting daughter has been  sensitive to medications.  From clinical psychiatric perspective, I do not conclude ADHD but certainly acknowledge the binge eating disorder and depressive fatigue and motivation that may contribute to her fixation in marginal housekeeping.  I do provide resource [place] [facility] Specialist if patient and her late fianc who was father of her baby's parents wish more formalized testing with Dr. Rachel Moulds.  However, for her binge eating disorder and her depression, I do prescribe Vyvanse 40 mg every morning sent as #30 E scribed to [facility] being provided co-pay coupon as she checks with [facility] about obtaining a 4-month mail less expensive supply for follow-up if helpful.  Her Paxil is unchanged at 30 mg every bedtime, trazodone 50 mg at bedtime, and Valium at 10 mg twice daily as needed for panic or anxiety having current supply.  She returns in 3 months or sooner if needed and will monitor blood pressure when on the Vyvanse relative to episodic modest elevation in the past understanding Vyvanse may increase it 4 to 10 mmHg.  Delight Hoh, MD

## 2019-08-18 ENCOUNTER — Telehealth: Payer: Self-pay

## 2019-08-18 NOTE — Telephone Encounter (Signed)
Prior authorization submitted and approved for Vyvanse 40 mg through [facility] effective 08/18/2019-08/17/2022

## 2019-08-30 ENCOUNTER — Other Ambulatory Visit: Payer: Self-pay | Admitting: Family Medicine

## 2019-08-30 NOTE — Telephone Encounter (Signed)
Last office visit 01/20/2019 Fall/Flank Pain.  Last refilled 11/13/2017 for 30 with 2 refills.  No future appointments.

## 2019-08-31 ENCOUNTER — Other Ambulatory Visit: Payer: Self-pay | Admitting: *Deleted

## 2019-08-31 MED ORDER — MUPIROCIN 2 % EX OINT: 1.0000 "application " | TOPICAL_OINTMENT | CUTANEOUS | 1 refills | Status: DC | PRN

## 2019-08-31 NOTE — Telephone Encounter (Signed)
Pt returned your call  Mupirocin antibiotic ointment She stated she has always got this in the past   [facility] [place]

## 2019-08-31 NOTE — Telephone Encounter (Signed)
Patient stated that she likes to keep this on hand in case she ever needs it.

## 2019-08-31 NOTE — Telephone Encounter (Signed)
Last office visit 01/27/2019 for Fall/Flank Pain.  Last refilled ?  Ok to refill?

## 2019-08-31 NOTE — Telephone Encounter (Signed)
[name] notified by telephone that Dr. Lorelei Pont has refilled her mupirocin ointment.

## 2019-08-31 NOTE — Telephone Encounter (Signed)
Patient left a voicemail stating that she is allergic to mycin drugs and is therefore unable to use neosporin ointment. Patient stated that she tried to get a refill on an ointment that Dr. Lorelei Pont has given her previously, but the prescription has expired. Patient left a voicemail wanting to know if Dr. Lorelei Pont would send a new script in for her?  Left a message for patient to call back because we need to verify the medication that she is requesting.

## 2019-09-13 ENCOUNTER — Telehealth: Payer: Self-pay | Admitting: Psychiatry

## 2019-09-13 ENCOUNTER — Other Ambulatory Visit: Payer: Self-pay

## 2019-09-13 DIAGNOSIS — F3342 Major depressive disorder, recurrent, in full remission: Secondary | ICD-10-CM

## 2019-09-13 DIAGNOSIS — F5081 Binge eating disorder: Secondary | ICD-10-CM

## 2019-09-13 MED ORDER — LISDEXAMFETAMINE DIMESYLATE 40 MG PO CAPS: 40.0000 mg | ORAL_CAPSULE | Freq: Every day | ORAL | 0 refills | Status: DC

## 2019-09-13 NOTE — Telephone Encounter (Signed)
Last refill 08/17/2019 Pended for approval, due back in Feb 2021

## 2019-09-13 NOTE — Telephone Encounter (Signed)
40 mg Vyvanse daily for binge eating disorder is reportedly tolerated and projecting efficacy so to continue with eScription for #30 no refill sent to [facility] medically necessary no contraindication.

## 2019-09-13 NOTE — Telephone Encounter (Signed)
Patient called and said that she needs a refill on her vyvanse 40mg  [facility] [place]

## 2019-10-19 ENCOUNTER — Ambulatory Visit (INDEPENDENT_AMBULATORY_CARE_PROVIDER_SITE_OTHER): Payer: BC Managed Care – PPO | Admitting: Family Medicine

## 2019-10-19 ENCOUNTER — Other Ambulatory Visit: Payer: Self-pay

## 2019-10-19 ENCOUNTER — Encounter: Payer: Self-pay | Admitting: Family Medicine

## 2019-10-19 VITALS — BP 148/100 | HR 93 | Temp 98.5°F | Ht 67.5 in | Wt 328.0 lb

## 2019-10-19 DIAGNOSIS — R600 Localized edema: Secondary | ICD-10-CM

## 2019-10-19 DIAGNOSIS — L03119 Cellulitis of unspecified part of limb: Secondary | ICD-10-CM | POA: Diagnosis not present

## 2019-10-19 DIAGNOSIS — L02211 Cutaneous abscess of abdominal wall: Secondary | ICD-10-CM | POA: Diagnosis not present

## 2019-10-19 DIAGNOSIS — L03311 Cellulitis of abdominal wall: Secondary | ICD-10-CM | POA: Diagnosis not present

## 2019-10-19 LAB — CBC WITH DIFFERENTIAL/PLATELET
Basophils Absolute: 0 10*3/uL (ref 0.0–0.1)
Basophils Relative: 0.4 % (ref 0.0–3.0)
Eosinophils Absolute: 0.4 10*3/uL (ref 0.0–0.7)
Eosinophils Relative: 6 % — ABNORMAL HIGH (ref 0.0–5.0)
HCT: 38.9 % (ref 36.0–46.0)
Hemoglobin: 12.5 g/dL (ref 12.0–15.0)
Lymphocytes Relative: 21.2 % (ref 12.0–46.0)
Lymphs Abs: 1.4 10*3/uL (ref 0.7–4.0)
MCHC: 32.3 g/dL (ref 30.0–36.0)
MCV: 89.2 fl (ref 78.0–100.0)
Monocytes Absolute: 0.6 10*3/uL (ref 0.1–1.0)
Monocytes Relative: 8.8 % (ref 3.0–12.0)
Neutro Abs: 4.3 10*3/uL (ref 1.4–7.7)
Neutrophils Relative %: 63.6 % (ref 43.0–77.0)
Platelets: 260 10*3/uL (ref 150.0–400.0)
RBC: 4.36 Mil/uL (ref 3.87–5.11)
RDW: 15.5 % (ref 11.5–15.5)
WBC: 6.8 10*3/uL (ref 4.0–10.5)

## 2019-10-19 LAB — HEPATIC FUNCTION PANEL
ALT: 14 U/L (ref 0–35)
AST: 22 U/L (ref 0–37)
Albumin: 4 g/dL (ref 3.5–5.2)
Alkaline Phosphatase: 79 U/L (ref 39–117)
Bilirubin, Direct: 0.1 mg/dL (ref 0.0–0.3)
Total Bilirubin: 0.5 mg/dL (ref 0.2–1.2)
Total Protein: 6.8 g/dL (ref 6.0–8.3)

## 2019-10-19 LAB — BASIC METABOLIC PANEL
BUN: 7 mg/dL (ref 6–23)
CO2: 30 mEq/L (ref 19–32)
Calcium: 9 mg/dL (ref 8.4–10.5)
Chloride: 102 mEq/L (ref 96–112)
Creatinine, Ser: 0.75 mg/dL (ref 0.40–1.20)
GFR: 85.14 mL/min (ref >60.00)
Glucose, Bld: 100 mg/dL — ABNORMAL HIGH (ref 70–99)
Potassium: 4.6 mEq/L (ref 3.5–5.1)
Sodium: 136 mEq/L (ref 135–145)

## 2019-10-19 LAB — BRAIN NATRIURETIC PEPTIDE: Pro B Natriuretic peptide (BNP): 24 pg/mL (ref 0.0–100.0)

## 2019-10-19 MED ORDER — CEFTRIAXONE SODIUM 1 G IJ SOLR
1.0000 g | Freq: Once | INTRAMUSCULAR | Status: AC
  Administered 2019-10-19: 10:00:00 1 g via INTRAMUSCULAR

## 2019-10-19 MED ORDER — FUROSEMIDE 20 MG PO TABS: 20.0000 mg | ORAL_TABLET | Freq: Every day | ORAL | 0 refills | Status: DC

## 2019-10-19 MED ORDER — FLUCONAZOLE 150 MG PO TABS: ORAL_TABLET | ORAL | 0 refills | Status: DC

## 2019-10-19 MED ORDER — SULFAMETHOXAZOLE-TRIMETHOPRIM 800-160 MG PO TABS: 2.0000 | ORAL_TABLET | Freq: Two times a day (BID) | ORAL | 0 refills | Status: AC

## 2019-10-19 NOTE — Progress Notes (Addendum)
Meredith Starner T. [name], MD Primary Care and [place] at [facility] [place], [address] Phone: [phone]  FAX: [phone]  Meredith Castillo - 39 y.o. female  MRN [id number]  Date of Birth: Apr 06, 1981  Visit Date: 10/19/2019  PCP: Owens Loffler, MD  Referred by: Owens Loffler, MD  Chief Complaint  Patient presents with  . Leg Swelling  . Naval in raw and draining    This visit occurred during the SARS-CoV-2 public health emergency.  Safety protocols were in place, including screening questions prior to the visit, additional usage of staff PPE, and extensive cleaning of exam room while observing appropriate contact time as indicated for disinfecting solutions.   Subjective:   Meredith Castillo is a 39 y.o. very pleasant female patient who presents with the following:  Navel cellulitis.  She had some redness in her navel, and this is progressed to a area about the size of a half dollar that is quite tender.  There does not appear to be an abscess but it is oozing.  She also has some lower extremity swelling and redness.  There is also some warmth in this region.  She has not had any trauma.  She has not been restricted with her movement or had any long car rides.  She does note a fairly large amount of weight gain recently.  Leg swelling.   Have something going on with her navel. Weeping bloody fluid and started yesterday.  Maybe like some yeast before.  Vincenza Hews got a lot worse.  Check labs.   Past Medical History, Surgical History, Social History, Family History, Problem List, Medications, and Allergies have been reviewed and updated if relevant.   GEN: As above GI: No n/v/d, eating normally Pulm: No SOB Interactive and getting along well at home.  Otherwise, ROS is as per the HPI.  Objective:   BP (!) 148/100   Pulse 93   Temp 98.5 F (36.9 C) (Temporal)   Ht 5' 7.5" (1.715 m)   Wt (!) 328 lb (148.8 kg)    SpO2 100%   BMI 50.61 kg/m   GEN: WDWN, NAD, Non-toxic, A & O x 3 HEENT: Atraumatic, Normocephalic. Neck supple. No masses, No LAD. Ears and Nose: No external deformity. CV: RRR, No M/G/R. No JVD. No thrill. No extra heart sounds. PULM: CTA B, no wheezes, crackles, rhonchi. No retractions. No resp. distress. No accessory muscle use. Extremities bilaterally are fairly significantly swollen approximately 3+ edema, and there is also redness and warmth. NEURO Normal gait.  PSYCH: Normally interactive. Conversant. Not depressed or anxious appearing.  Calm demeanor.         Laboratory and Imaging Data:  Assessment and Plan:     ICD-10-CM   1. Cellulitis, abdominal wall  [id number] Basic metabolic panel    CBC with Differential/Platelet    Hepatic function panel    WOUND CULTURE    cefTRIAXone (ROCEPHIN) injection 1 g  2. Cellulitis of lower extremity, unspecified laterality  [id number] Basic metabolic panel    CBC with Differential/Platelet    Hepatic function panel    cefTRIAXone (ROCEPHIN) injection 1 g  3. Bilateral leg edema  R60.0 Brain natriuretic peptide  4. Abscess of abdominal wall  L02.211    Medical decision making in the case is HIGH.  Concerning with cellulitis on lower extremities and what appears to be cellulitis in the navel as well.  Given appearance I do think that she  probably had a preceding yeast infection at the navel.  I am going to check lab work, and additionally I gave her 1 g of Rocephin in the office.  She will begin Bactrim tomorrow.  I am also going to give her some Diflucan for yeast.  Follow-up: She will follow up on Friday with my partner Amy Bedsole.  Meds ordered this encounter  Medications  . sulfamethoxazole-trimethoprim (BACTRIM DS) 800-160 MG tablet    Sig: Take 2 tablets by mouth 2 (two) times daily for 10 days.    Dispense:  40 tablet    Refill:  0    GENERIC PLEASE  . DISCONTD: furosemide (LASIX) 20 MG tablet    Sig: Take 1 tablet (20  mg total) by mouth daily.    Dispense:  5 tablet    Refill:  0  . fluconazole (DIFLUCAN) 150 MG tablet    Sig: 1 tab po every other day    Dispense:  3 tablet    Refill:  0  . cefTRIAXone (ROCEPHIN) injection 1 g    Order Specific Question:   Antibiotic Indication:    Answer:   Cellulitis   Orders Placed This Encounter  Procedures  . WOUND CULTURE  . Basic metabolic panel  . CBC with Differential/Platelet  . Hepatic function panel  . Brain natriuretic peptide    Signed,  Carli Lefevers T. Mabrey Howland, MD   Outpatient Encounter Medications as of 10/19/2019  Medication Sig  . albuterol (PROVENTIL HFA;VENTOLIN HFA) 108 (90 BASE) MCG/ACT inhaler Inhale 2 puffs into the lungs every 4 (four) hours as needed for wheezing or shortness of breath.  . cetirizine (ZYRTEC) 10 MG tablet Take 10 mg by mouth daily.  . diazepam (VALIUM) 10 MG tablet Take 1 tablet (10 mg total) by mouth 2 (two) times daily as needed for anxiety.  . fluticasone (FLONASE) 50 MCG/ACT nasal spray Place 1 spray into both nostrils daily as needed for allergies.  Marland Kitchen HYDROcodone-acetaminophen (NORCO/VICODIN) 5-325 MG tablet Take 1 tablet by mouth every 6 (six) hours as needed for moderate pain or severe pain.  Marland Kitchen ibuprofen (ADVIL,MOTRIN) 200 MG tablet Take 400-800 mg by mouth every 8 (eight) hours as needed for headache or moderate pain.   Marland Kitchen levonorgestrel (MIRENA) 20 MCG/24HR IUD 1 each by Intrauterine route once.  . Multiple Vitamins-Minerals (MULTIVITAMIN WITH MINERALS) tablet Take 1 tablet by mouth daily.  . mupirocin ointment (BACTROBAN) 2 % Place 1 application into the nose as needed (for wound care).  Marland Kitchen PARoxetine (PAXIL) 30 MG tablet Take 1 tablet (30 mg total) by mouth daily at 10 pm.  . promethazine (PHENERGAN) 25 MG tablet TAKE 1 TABLET BY MOUTH EVERY 6 HOURS AS NEEDED FOR NAUSEA AND VOMITING  . traZODone (DESYREL) 50 MG tablet Take 1 tablet (50 mg total) by mouth at bedtime.  . [DISCONTINUED] lisdexamfetamine (VYVANSE) 40  MG capsule Take 1 capsule (40 mg total) by mouth daily after breakfast.  . fluconazole (DIFLUCAN) 150 MG tablet 1 tab po every other day  . [EXPIRED] sulfamethoxazole-trimethoprim (BACTRIM DS) 800-160 MG tablet Take 2 tablets by mouth 2 (two) times daily for 10 days.  . [DISCONTINUED] furosemide (LASIX) 20 MG tablet Take 1 tablet (20 mg total) by mouth daily.  . [DISCONTINUED] promethazine (PHENERGAN) 25 MG tablet TAKE 1 TABLET BY MOUTH EVERY 6 HOURS AS NEEDED FOR NAUSEA AND VOMITING  . [EXPIRED] cefTRIAXone (ROCEPHIN) injection 1 g    No facility-administered encounter medications on file as of 10/19/2019.

## 2019-10-21 ENCOUNTER — Ambulatory Visit (INDEPENDENT_AMBULATORY_CARE_PROVIDER_SITE_OTHER): Payer: BC Managed Care – PPO | Admitting: Family Medicine

## 2019-10-21 ENCOUNTER — Encounter: Payer: Self-pay | Admitting: Family Medicine

## 2019-10-21 VITALS — Temp 97.9°F | Ht 67.5 in

## 2019-10-21 DIAGNOSIS — L03316 Cellulitis of umbilicus: Secondary | ICD-10-CM | POA: Insufficient documentation

## 2019-10-21 DIAGNOSIS — B009 Herpesviral infection, unspecified: Secondary | ICD-10-CM | POA: Diagnosis not present

## 2019-10-21 DIAGNOSIS — L03115 Cellulitis of right lower limb: Secondary | ICD-10-CM | POA: Insufficient documentation

## 2019-10-21 DIAGNOSIS — L03119 Cellulitis of unspecified part of limb: Secondary | ICD-10-CM | POA: Insufficient documentation

## 2019-10-21 DIAGNOSIS — R600 Localized edema: Secondary | ICD-10-CM

## 2019-10-21 MED ORDER — FUROSEMIDE 20 MG PO TABS: 20.0000 mg | ORAL_TABLET | Freq: Every day | ORAL | 0 refills | Status: DC

## 2019-10-21 MED ORDER — VALACYCLOVIR HCL 1 G PO TABS: 2000.0000 mg | ORAL_TABLET | Freq: Two times a day (BID) | ORAL | 1 refills | Status: DC

## 2019-10-21 NOTE — Progress Notes (Signed)
VIRTUAL VISIT Due to national recommendations of social distancing due to [place] 19, a virtual visit is felt to be most appropriate for this patient at this time.   I connected with the patient on 10/21/19 at 10:00 AM EST by virtual telehealth platform and verified that I am speaking with the correct person using two identifiers.   I discussed the limitations, risks, security and privacy concerns of performing an evaluation and management service by  virtual telehealth platform and the availability of in person appointments. I also discussed with the patient that there may be a patient responsible charge related to this service. The patient expressed understanding and agreed to proceed.  Patient location: Home Provider Location: [facility] Participants: Eliezer Lofts and Arvilla Meres   Chief Complaint  Patient presents with  . Follow-up    Cellulitis-seen by Dr. Lorelei Pont on 10/19/2019  . Cold sores in Nostrils    asking for rx for Valtrex    History of Present Illness:   39 year old female presents for follow up cellulitis.  Cellulitis: Seen on 1/20 for cellulitis of naval as well as possible cellulitis of bilateral lower with  extremity redness.  Also possible proceeding cadidal infeciton of naval.  given 1 g IM of rocephin.  Started on diflucan and bactrim.  reviewed note and pictures in detail.  Nml BMET.. nml cr and k.   Today pt reports: she has noted a small amount of improvement in naval swelling and redness.  Still pain in naval, but no redness spreading.  She is still having discharge, clear weeping at [employer].  On day 3 of 10 of bactrim.   She was also given lasix for peripheral edema.  Given 5 days.. has 3 more days of thisThis has improved the swelling in her legs about 25 %. Redness in legs is improved.  She is trying to elevate her legs.   no fever, no emesis   She also has a cold sore in her nose and would like a prescription for valtrex.  She has had  similar in past.    COVID 19 screen No recent travel or known exposure to [place] The patient denies respiratory symptoms of COVID 19 at this time.  The importance of social distancing was discussed today.   Review of Systems  Constitutional: Negative for chills and fever.  HENT: Negative for congestion and ear pain.   Eyes: Negative for pain and redness.  Respiratory: Negative for cough and shortness of breath.   Cardiovascular: Negative for chest pain, palpitations and leg swelling.  Gastrointestinal: Negative for abdominal pain, blood in stool, constipation, diarrhea, nausea and vomiting.  Genitourinary: Negative for dysuria.  Musculoskeletal: Negative for falls and myalgias.  Skin: Positive for rash.  Neurological: Negative for dizziness.  Psychiatric/Behavioral: Negative for depression. The patient is not nervous/anxious.       Past Medical History:  Diagnosis Date  . Asthma   . Bariatric surgery status complicating pregnancy, childbirth, or the puerperium, antepartum condition or complication [id number]  . HTN (hypertension)   . Iron deficiency anemia    due to gastric bypass.   . Mild asthma 01/10/2014  . Morbid obesity with BMI of 40.0-44.9, adult ([facility])   . PCOS (polycystic ovarian syndrome)   . PP care - s/p SVD 2/19 11/19/2011  . Unspecified vitamin D deficiency   . Vitamin B12 deficiency    due to gastric bypass.     reports that she quit smoking about 14 years ago.  She smoked 0.25 packs per day. She has never used smokeless tobacco. She reports current drug use. She reports that she does not drink alcohol.   Current Outpatient Medications:  .  albuterol (PROVENTIL HFA;VENTOLIN HFA) 108 (90 BASE) MCG/ACT inhaler, Inhale 2 puffs into the lungs every 4 (four) hours as needed for wheezing or shortness of breath., Disp: 1 Inhaler, Rfl: 3 .  cetirizine (ZYRTEC) 10 MG tablet, Take 10 mg by mouth daily., Disp: , Rfl:  .  diazepam (VALIUM) 10 MG tablet, Take 1 tablet (10 mg  total) by mouth 2 (two) times daily as needed for anxiety., Disp: 180 tablet, Rfl: 1 .  fluconazole (DIFLUCAN) 150 MG tablet, 1 tab po every other day, Disp: 3 tablet, Rfl: 0 .  fluticasone (FLONASE) 50 MCG/ACT nasal spray, Place 1 spray into both nostrils daily as needed for allergies., Disp: , Rfl:  .  furosemide (LASIX) 20 MG tablet, Take 1 tablet (20 mg total) by mouth daily., Disp: 5 tablet, Rfl: 0 .  HYDROcodone-acetaminophen (NORCO/VICODIN) 5-325 MG tablet, Take 1 tablet by mouth every 6 (six) hours as needed for moderate pain or severe pain., Disp: 30 tablet, Rfl: 0 .  ibuprofen (ADVIL,MOTRIN) 200 MG tablet, Take 400-800 mg by mouth every 8 (eight) hours as needed for headache or moderate pain. , Disp: , Rfl:  .  levonorgestrel (MIRENA) 20 MCG/24HR IUD, 1 each by Intrauterine route once., Disp: , Rfl:  .  lisdexamfetamine (VYVANSE) 40 MG capsule, Take 1 capsule (40 mg total) by mouth daily after breakfast., Disp: 30 capsule, Rfl: 0 .  Multiple Vitamins-Minerals (MULTIVITAMIN WITH MINERALS) tablet, Take 1 tablet by mouth daily., Disp: , Rfl:  .  mupirocin ointment (BACTROBAN) 2 %, Place 1 application into the nose as needed (for wound care)., Disp: 22 g, Rfl: 1 .  PARoxetine (PAXIL) 30 MG tablet, Take 1 tablet (30 mg total) by mouth daily at 10 pm., Disp: 90 tablet, Rfl: 3 .  promethazine (PHENERGAN) 25 MG tablet, TAKE 1 TABLET BY MOUTH EVERY 6 HOURS AS NEEDED FOR NAUSEA AND VOMITING, Disp: 30 tablet, Rfl: 2 .  sulfamethoxazole-trimethoprim (BACTRIM DS) 800-160 MG tablet, Take 2 tablets by mouth 2 (two) times daily for 10 days., Disp: 40 tablet, Rfl: 0 .  traZODone (DESYREL) 50 MG tablet, Take 1 tablet (50 mg total) by mouth at bedtime., Disp: 90 tablet, Rfl: 3   Observations/Objective: Temperature 97.9 F (36.6 C), temperature source Temporal, height 5' 7.5" (1.715 m).  Physical Exam  Physical Exam Constitutional:      General: The patient is not in acute distress. Pulmonary:      Effort: Pulmonary effort is normal. No respiratory distress.  Neurological:     Mental Status: The patient is alert and oriented to person, place, and time.  Psychiatric:        Mood and Affect: Mood normal.        Behavior: Behavior normal.  Attempted to visualize bilateral legs and umbilicus on video visit.. redness has receded back from all markings.  Continued discharge at umbilicus, buit also improving erythema.  Edema in bilateral ankles.  Assessment and Plan   Cellulitis of umbilicus  Continue treatment with antifungal and antibiotic.  Improving. Complete courses as already covered for MRSA.  pt tolerating meds and no fever.     Bilateral leg edema Most likely erythema in legs due to peripheral edema .. continue diuresis, refilled 5 more days of low dose lasix.  pt encouraged to elevate legs.  less likely cellulitis:  On appropriate antibiotics.   pt to foillow up if not continuing to improve. '  I discussed the assessment and treatment plan with the patient. The patient was provided an opportunity to ask questions and all were answered. The patient agreed with the plan and demonstrated an understanding of the instructions.   The patient was advised to call back or seek an in-person evaluation if the symptoms worsen or if the condition fails to improve as anticipated.     Eliezer Lofts, MD

## 2019-10-21 NOTE — Patient Instructions (Addendum)
Conitnue lasix for another 5 days.  Elevate leg above your heart.  Continue fluconazole and bactrim.  if not resolved at end of course follow up.

## 2019-10-21 NOTE — Assessment & Plan Note (Signed)
Most likely erythema in legs due to peripheral edema .. continue diuresis, refilled 5 more days of low dose lasix.  pt encouraged to elevate legs.   less likely cellulitis:  On appropriate antibiotics.   pt to foillow up if not continuing to improve.

## 2019-10-21 NOTE — Assessment & Plan Note (Addendum)
Continue treatment with antifungal and antibiotic.  Improving. Complete courses as already covered for MRSA.  pt tolerating meds and no fever.

## 2019-10-22 LAB — WOUND CULTURE
MICRO NUMBER:: 10061732
SPECIMEN QUALITY:: ADEQUATE

## 2019-10-25 ENCOUNTER — Other Ambulatory Visit: Payer: Self-pay

## 2019-10-25 ENCOUNTER — Telehealth: Payer: Self-pay | Admitting: Psychiatry

## 2019-10-25 DIAGNOSIS — F50819 Binge eating disorder, unspecified: Secondary | ICD-10-CM

## 2019-10-25 DIAGNOSIS — F5081 Binge eating disorder: Secondary | ICD-10-CM

## 2019-10-25 DIAGNOSIS — F3342 Major depressive disorder, recurrent, in full remission: Secondary | ICD-10-CM

## 2019-10-25 MED ORDER — LISDEXAMFETAMINE DIMESYLATE 40 MG PO CAPS: 40.0000 mg | ORAL_CAPSULE | Freq: Every day | ORAL | 0 refills | Status: DC

## 2019-10-25 NOTE — Telephone Encounter (Signed)
Patient called and said that she needs a refill on her vyvanse 40 mg to [facility] [place]. Next appt 2/10

## 2019-10-25 NOTE — Telephone Encounter (Signed)
Last appointment 08/17/2019 starting Vyvanse 40 mg daily for binge eating disorder and depression appropriate to E scribe update of #30 with no refill to [facility] [place] for interim to next appointment next month.

## 2019-10-25 NOTE — Telephone Encounter (Signed)
Last refill 09/19/2019, pended for Dr. Creig Hines to [facility] to [facility]

## 2019-11-09 ENCOUNTER — Encounter: Payer: Self-pay | Admitting: Psychiatry

## 2019-11-09 ENCOUNTER — Other Ambulatory Visit: Payer: Self-pay

## 2019-11-09 ENCOUNTER — Ambulatory Visit (INDEPENDENT_AMBULATORY_CARE_PROVIDER_SITE_OTHER): Payer: BC Managed Care – PPO | Admitting: Psychiatry

## 2019-11-09 VITALS — BP 122/88 | HR 90 | Ht 67.5 in | Wt 302.0 lb

## 2019-11-09 DIAGNOSIS — F3342 Major depressive disorder, recurrent, in full remission: Secondary | ICD-10-CM | POA: Diagnosis not present

## 2019-11-09 DIAGNOSIS — E282 Polycystic ovarian syndrome: Secondary | ICD-10-CM | POA: Diagnosis not present

## 2019-11-09 DIAGNOSIS — F5081 Binge eating disorder: Secondary | ICD-10-CM | POA: Diagnosis not present

## 2019-11-09 DIAGNOSIS — F411 Generalized anxiety disorder: Secondary | ICD-10-CM

## 2019-11-09 MED ORDER — DIAZEPAM 10 MG PO TABS: 10.0000 mg | ORAL_TABLET | Freq: Two times a day (BID) | ORAL | 1 refills | Status: DC | PRN

## 2019-11-09 MED ORDER — LISDEXAMFETAMINE DIMESYLATE 40 MG PO CAPS: 40.0000 mg | ORAL_CAPSULE | Freq: Every day | ORAL | 0 refills | Status: DC

## 2019-11-09 MED ORDER — PAROXETINE HCL 30 MG PO TABS: 30.0000 mg | ORAL_TABLET | Freq: Every day | ORAL | 3 refills | Status: DC

## 2019-11-09 MED ORDER — TRAZODONE HCL 50 MG PO TABS: 50.0000 mg | ORAL_TABLET | Freq: Every day | ORAL | 3 refills | Status: DC

## 2019-11-09 NOTE — Progress Notes (Signed)
[facility]  Patient ID: SHAQUELLE HANBACK,  MRN: [id number]  PCP: Owens Loffler, MD  Date of Evaluation: 11/09/2019 Time spent:20 minutes from 1540 to 1600  Chief Complaint:  Chief Complaint    Depression; Anxiety; Eating Disorder      HISTORY/CURRENT STATUS: Vaanya is seen onsite in office 20 minutes face-to-face individually with consent with epic collateral for psychiatric interview and exam in 21-month evaluation and management of major depression, generalized anxiety, and binge eating disorder.  At last appointment, patient possibly from the negative statements to control her by parents of the father of her youngest child was firmly fixated upon determining that she has ADHD like her daughter to justify to the daughter's grandparents that patient is doing the best she can.  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  One daughter is seeing the school counselor about the control and negativity exerted by the grandparents, though the grandparents are now distracted by having to care for the great grandmother in her 12s.  The patient therefore continues to manage the home they are allowed to inhabit by the grandparents who however pressure them to leave if they do not keep the home clean or organized in their opinion.  However the patient is keeping up with all of these issues quite well now.  She functions much better on Vyvanse 40 mg daily for her binge eating disorder also helping her depression though she did not attend the [place] [facility] for the specialized testing she considered as ADHD has not been clinically evident in her care here.  [place] registry documents last Vyvanse dispensing was 10/25/2019, and patient today is concerned that she is having to request a new prescription each month asking if multiple prescriptions are now possible at the pharmacy.  The patient  reviews her deceased fiancs Bitcoin beginning investment which has greatly multiplied in worth as she attempts to see its place in the future for herself and her children, though she did note that her father has a backup home for them if needed as per last visit.  Patient is taking trazodone, Paxil, as needed Valium, and the Vyvanse now feeling and functioning well though she has not lost weight yet from last appointment being up 6 pounds though her weight is down from 328 at her medical appointment 10/19/2019 at which time her blood pressure was slightly 140/100 when she had significant edema of the abdominal wall from cellulitis.  She has no mania, suicidality, psychosis or delirium today.  Depression The patient presents withdepressionas a recurrentproblem.The most recent episodestarted more than 2 year ago and has not recurred despite environmental stressors and diathesis. The onset quality is sudden. The problem occurs intermittently.The problemisnowinresolution.Associated symptoms include appetite change, decreased concentration,fatigueand insomnia. Associated symptoms include no helplessness,no hopelessness,no irritability,no restlessness,no decreased interest,no sadness,and no suicidal ideas.The symptoms are aggravated by work stress, social issues and family issues.Past treatments include SSRIs - Selective serotonin reuptake inhibitors, other medications and psychotherapy.Compliance with treatment is good.Past compliance problems include difficulty with treatment plan, medication issues and medical issues.Risk factors include family history, family history of mental illness, history of mental illness, major life event and stress. Past medical history includes chronic illness,anxiety,depressionand mental health disorder. Pertinent negatives include no life-threatening condition,no recent psychiatric admission,no bipolar disorder,no eating  disorder,no obsessive-compulsive disorder,no post-traumatic stress disorder,no schizophrenia,no suicide attemptsand no head trauma.  Individual Medical History/ Review of  Systems: Changes? :Yes interim cellulitis of the abdominal wall was painful and metabolically overwhelming but she did respond to the intensive treatment.  She still has Mirena IUD.  Allergies: Vancomycin and Neosporin [neomycin-polymyxin-gramicidin]  Current Medications:  Current Outpatient Medications:  .  albuterol (PROVENTIL HFA;VENTOLIN HFA) 108 (90 BASE) MCG/ACT inhaler, Inhale 2 puffs into the lungs every 4 (four) hours as needed for wheezing or shortness of breath., Disp: 1 Inhaler, Rfl: 3 .  cetirizine (ZYRTEC) 10 MG tablet, Take 10 mg by mouth daily., Disp: , Rfl:  .  diazepam (VALIUM) 10 MG tablet, Take 1 tablet (10 mg total) by mouth 2 (two) times daily as needed for anxiety., Disp: 180 tablet, Rfl: 1 .  fluconazole (DIFLUCAN) 150 MG tablet, 1 tab po every other day, Disp: 3 tablet, Rfl: 0 .  fluticasone (FLONASE) 50 MCG/ACT nasal spray, Place 1 spray into both nostrils daily as needed for allergies., Disp: , Rfl:  .  furosemide (LASIX) 20 MG tablet, Take 1 tablet (20 mg total) by mouth daily., Disp: 5 tablet, Rfl: 0 .  HYDROcodone-acetaminophen (NORCO/VICODIN) 5-325 MG tablet, Take 1 tablet by mouth every 6 (six) hours as needed for moderate pain or severe pain., Disp: 30 tablet, Rfl: 0 .  ibuprofen (ADVIL,MOTRIN) 200 MG tablet, Take 400-800 mg by mouth every 8 (eight) hours as needed for headache or moderate pain. , Disp: , Rfl:  .  levonorgestrel (MIRENA) 20 MCG/24HR IUD, 1 each by Intrauterine route once., Disp: , Rfl:  .  [START ON 11/24/2019] lisdexamfetamine (VYVANSE) 40 MG capsule, Take 1 capsule (40 mg total) by mouth daily after breakfast., Disp: 30 capsule, Rfl: 0 .  [START ON 12/24/2019] lisdexamfetamine (VYVANSE) 40 MG capsule, Take 1 capsule (40 mg total) by mouth daily after breakfast., Disp: 30  capsule, Rfl: 0 .  [START ON 01/23/2020] lisdexamfetamine (VYVANSE) 40 MG capsule, Take 1 capsule (40 mg total) by mouth daily after breakfast., Disp: 30 capsule, Rfl: 0 .  Multiple Vitamins-Minerals (MULTIVITAMIN WITH MINERALS) tablet, Take 1 tablet by mouth daily., Disp: , Rfl:  .  mupirocin ointment (BACTROBAN) 2 %, Place 1 application into the nose as needed (for wound care)., Disp: 22 g, Rfl: 1 .  PARoxetine (PAXIL) 30 MG tablet, Take 1 tablet (30 mg total) by mouth daily at 10 pm., Disp: 90 tablet, Rfl: 3 .  promethazine (PHENERGAN) 25 MG tablet, TAKE 1 TABLET BY MOUTH EVERY 6 HOURS AS NEEDED FOR NAUSEA AND VOMITING, Disp: 30 tablet, Rfl: 2 .  traZODone (DESYREL) 50 MG tablet, Take 1 tablet (50 mg total) by mouth at bedtime., Disp: 90 tablet, Rfl: 3 .  valACYclovir (VALTREX) 1000 MG tablet, Take 2 tablets (2,000 mg total) by mouth 2 (two) times daily., Disp: 4 tablet, Rfl: 1  Medication Side Effects: weight gain  Family Medical/ Social History: Changes? No  MENTAL HEALTH EXAM:  Blood pressure 122/88, pulse 90, height 5' 7.5" (1.715 m), weight (!) 302 lb (137 kg).Body mass index is 46.6 kg/m. Muscle strengths and tone 5/5, postural reflexes and gait 0/0, and AIMS = 0 otherwise deferred for coronavirus shutdown  General Appearance: Casual, Fairly Groomed, Guarded and Obese  Eye Contact:  Good  Speech:  Clear and Coherent, Normal Rate and Talkative  Volume:  Normal  Mood:  Anxious, Euthymic and Worthless  Affect:  Congruent, Inappropriate, Full Range and Anxious  Thought Process:  Coherent, Goal Directed, Irrelevant and Descriptions of Associations: Tangential  Orientation:  Full (Time, Place, and Person)  Thought Content: Ilusions,  Rumination and Tangential   Suicidal Thoughts:  No  Homicidal Thoughts:  No  Memory:  Immediate;   Good Remote;   Good  Judgement:  Fair  Insight:  Fair  Psychomotor Activity:  Normal, Decreased and Mannerisms  Concentration:  Concentration: Fair and  Attention Span: Good  Recall:  Good  Fund of Knowledge: Good  Language: Good  Assets:  Desire for Improvement Resilience Talents/Skills Vocational/Educational  ADL's:  Intact  Cognition: WNL  Prognosis:  Good    DIAGNOSES:    ICD-10-CM   1. Major depressive disorder, recurrent episode, in full remission ([place])  F33.42 PARoxetine (PAXIL) 30 MG tablet    diazepam (VALIUM) 10 MG tablet    traZODone (DESYREL) 50 MG tablet    lisdexamfetamine (VYVANSE) 40 MG capsule  2. Generalized anxiety disorder  F41.1 PARoxetine (PAXIL) 30 MG tablet    diazepam (VALIUM) 10 MG tablet    traZODone (DESYREL) 50 MG tablet  3. Binge eating disorder  F50.81 lisdexamfetamine (VYVANSE) 40 MG capsule    lisdexamfetamine (VYVANSE) 40 MG capsule    lisdexamfetamine (VYVANSE) 40 MG capsule  4. PCOS (polycystic ovarian syndrome)  E28.2     Receiving Psychotherapy: No Considered seeing Shanon Ace, LCSW after last seeing [name], [facility] 01/14/2019.   RECOMMENDATIONS: Psychosupportive psychoeducation consolidates and reinforces patient's strengths and positive coping skills while clarifying the impacts and changes made relative to past trauma and loss.  Her previous bariatric surgery reduced her weight 100 pounds.  Her blood pressure is better today but she continues to need metabolic follow-up with her primary care.  She is E scribed to continue Vyvanse 40 mg every morning after breakfast sent as a 30-day supply each for February 25, March 27, and April 26 to [facility] for binge eating disorder and depression.  She is E scribed to [facility] mail service trazodone 50 mg at bedtime sent as #90 with 3 refills for major depression and generalized anxiety associated insomnia.  She is E scribed Paxil 30 mg every bedtime sent as #90 with 3 refills to [facility] for anxiety and depression and binge eating disorder.  He is E scribed Valium 10 mg tablet twice daily as needed for anxiety sent #180 with 1 refill to [facility] for generalized anxiety.  She returns for follow-up in 6 months or sooner if needed.   Delight Hoh, MD

## 2019-12-07 IMAGING — DX LEFT RIBS - 2 VIEW
5 series · 6 of 6 positions shown · non-contrast
Comparison: Chest radiograph August 04, 2016 and chest CT August 06, 2016

CLINICAL DATA: Pain following fall

EXAM:
CHEST AND LEFT RIBS - 3+ VIEW

[Series 1: chest pa · 0.14mm/px · 2 of 2 slices shown]
[im 1/2]
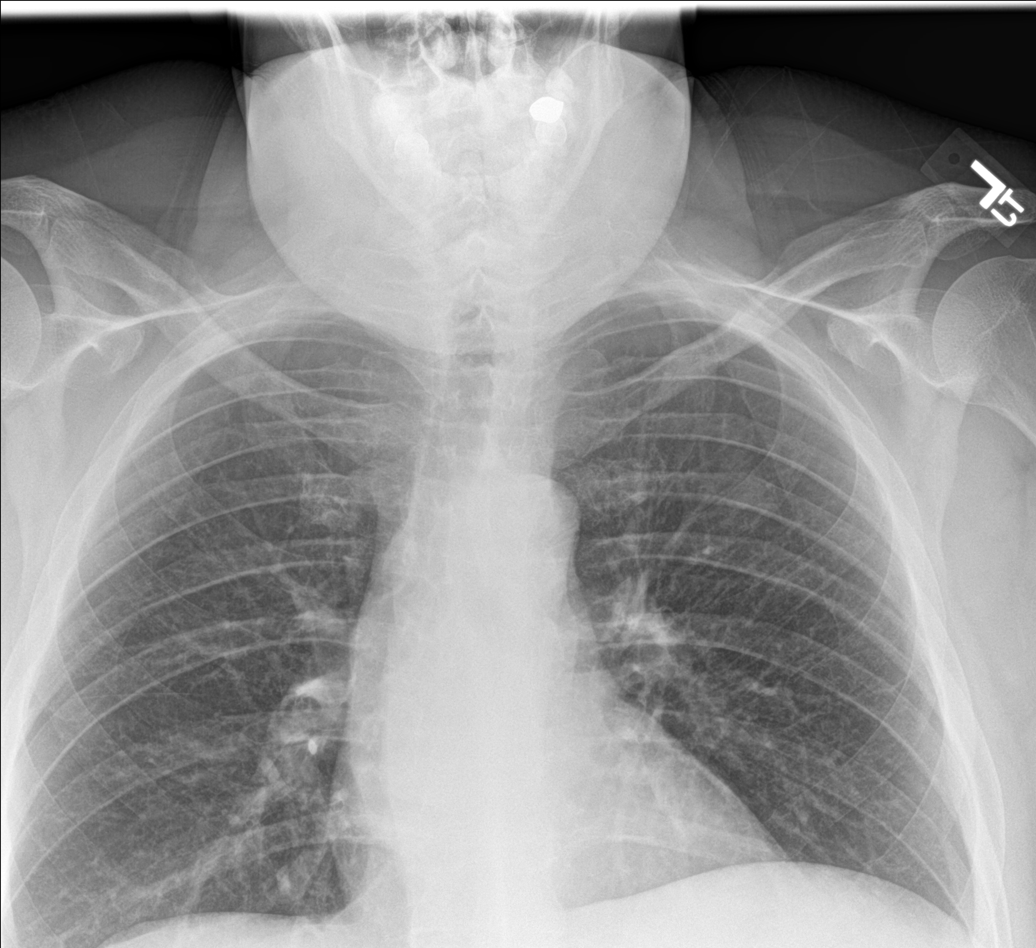
[im 2/2]
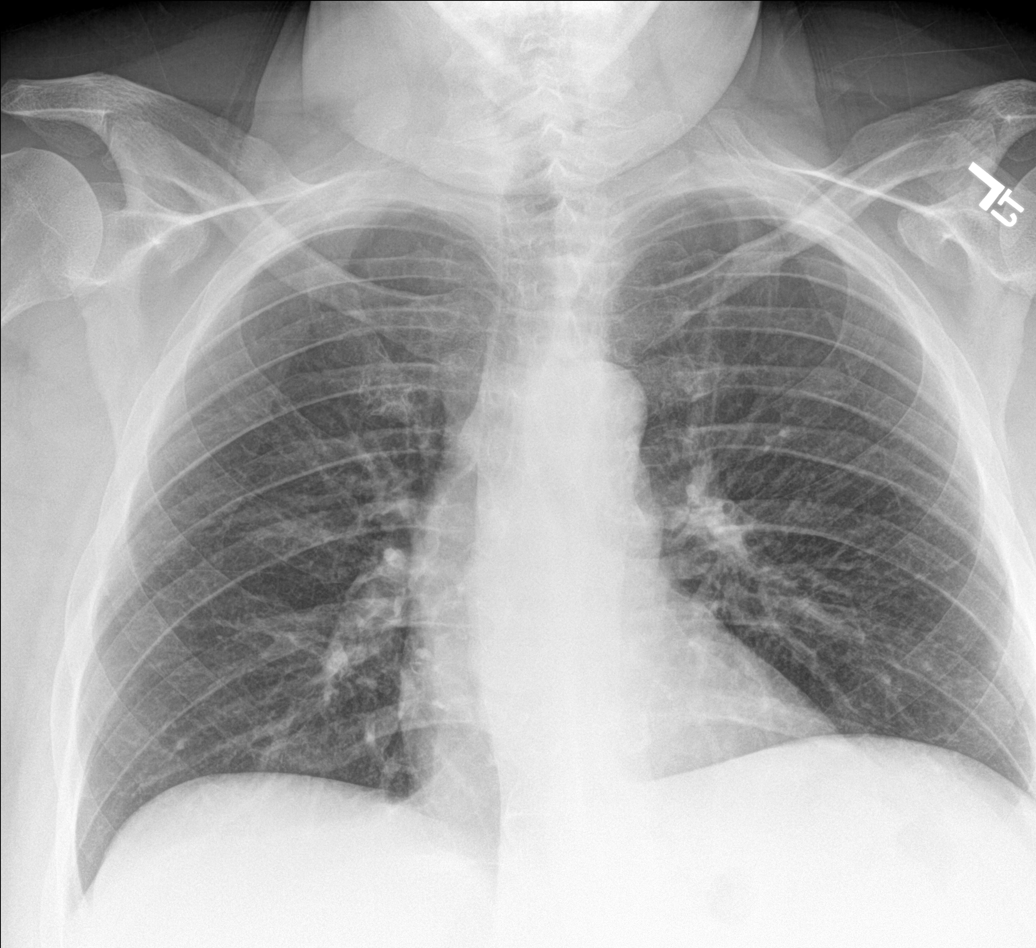

[rib obl (1 of 2)]
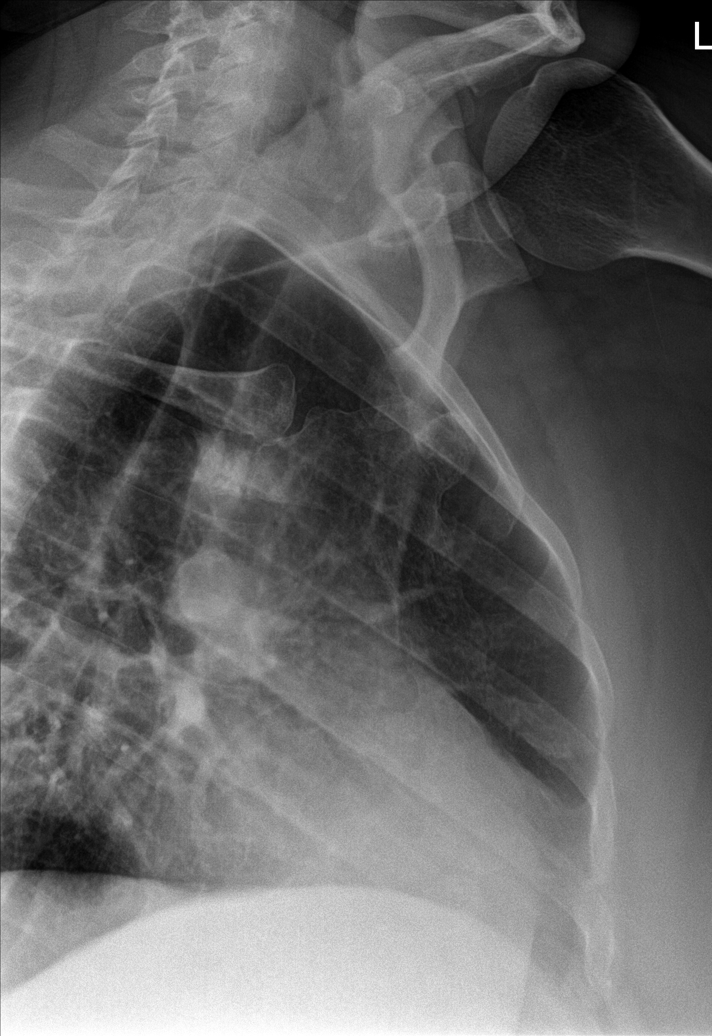

[rib obl (2 of 2)]
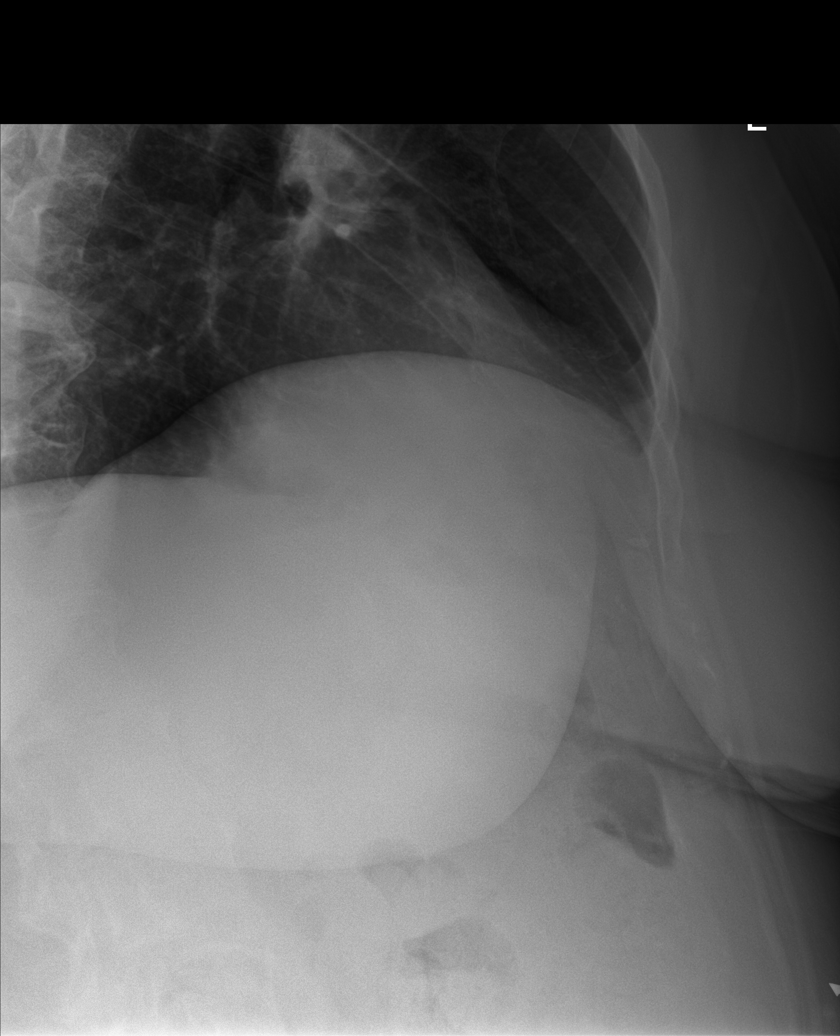

[rib pa (1 of 2)]
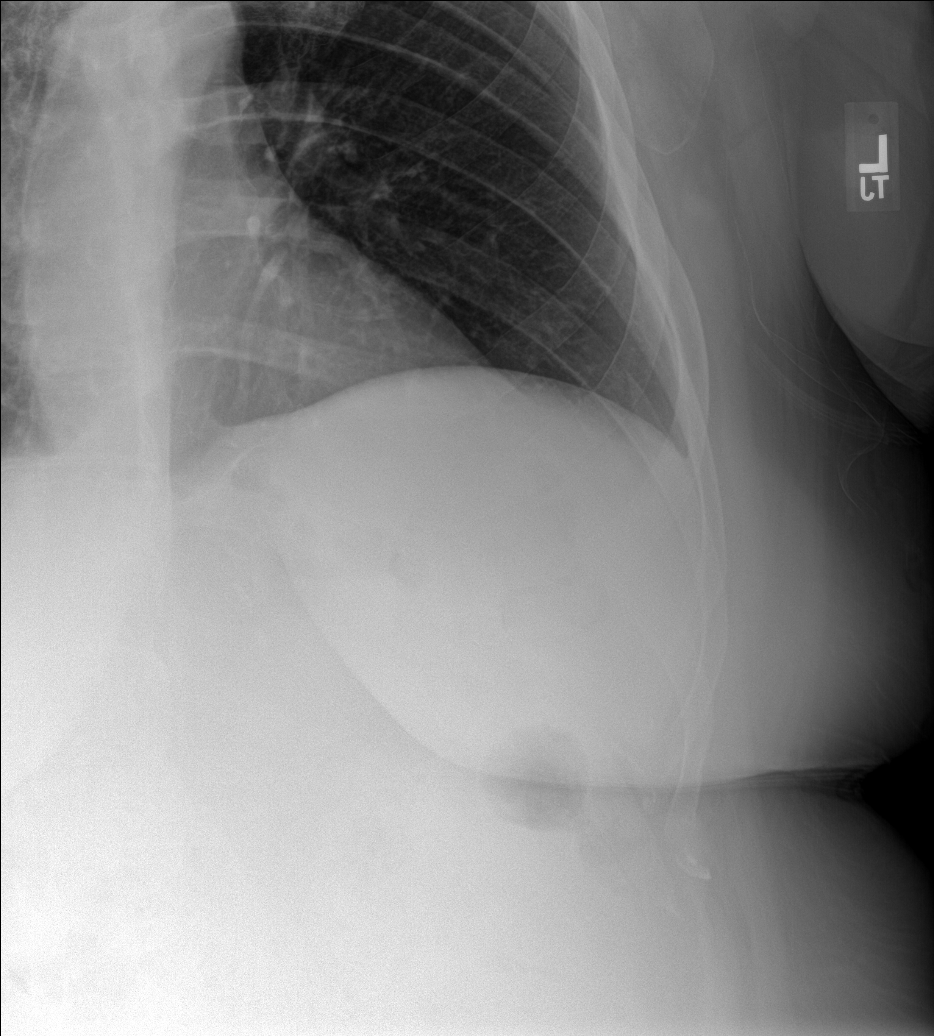

[rib pa (2 of 2)]
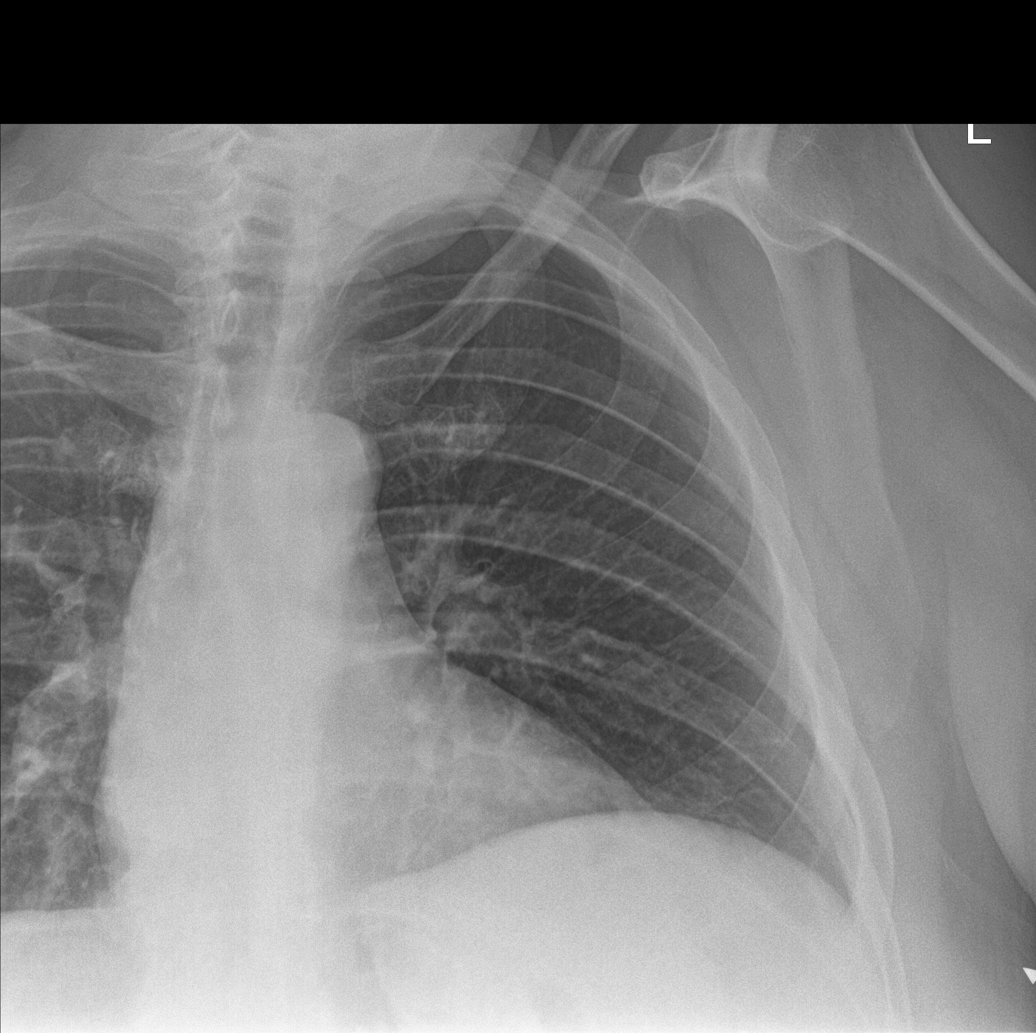

[6 of 6 positions shown; findings below may reference images not displayed]

FINDINGS: Frontal chest as well as oblique and cone-down rib images were
obtained. Lungs are clear. Heart size and pulmonary vascularity are
normal. No adenopathy. No evident pneumothorax or pleural effusion.
No fracture appreciable.
IMPRESSION: No demonstrable rib fracture.  Lungs clear.

## 2020-01-06 ENCOUNTER — Telehealth: Payer: Self-pay

## 2020-01-06 ENCOUNTER — Ambulatory Visit (INDEPENDENT_AMBULATORY_CARE_PROVIDER_SITE_OTHER): Payer: BC Managed Care – PPO | Admitting: Family Medicine

## 2020-01-06 ENCOUNTER — Encounter: Payer: Self-pay | Admitting: Family Medicine

## 2020-01-06 ENCOUNTER — Other Ambulatory Visit: Payer: Self-pay

## 2020-01-06 VITALS — BP 156/94 | HR 88 | Temp 98.1°F | Ht 67.0 in | Wt 304.0 lb

## 2020-01-06 DIAGNOSIS — B379 Candidiasis, unspecified: Secondary | ICD-10-CM

## 2020-01-06 DIAGNOSIS — R21 Rash and other nonspecific skin eruption: Secondary | ICD-10-CM | POA: Diagnosis not present

## 2020-01-06 DIAGNOSIS — R600 Localized edema: Secondary | ICD-10-CM | POA: Diagnosis not present

## 2020-01-06 DIAGNOSIS — R200 Anesthesia of skin: Secondary | ICD-10-CM | POA: Diagnosis not present

## 2020-01-06 MED ORDER — FUROSEMIDE 20 MG PO TABS: 20.0000 mg | ORAL_TABLET | Freq: Every day | ORAL | 0 refills | Status: DC

## 2020-01-06 MED ORDER — NYSTATIN 100000 UNIT/GM EX OINT: 1.0000 "application " | TOPICAL_OINTMENT | Freq: Two times a day (BID) | CUTANEOUS | 0 refills | Status: DC

## 2020-01-06 MED ORDER — TRIAMCINOLONE ACETONIDE 0.1 % EX LOTN: 1.0000 "application " | TOPICAL_LOTION | Freq: Two times a day (BID) | CUTANEOUS | 1 refills | Status: DC

## 2020-01-06 MED ORDER — SULFAMETHOXAZOLE-TRIMETHOPRIM 800-160 MG PO TABS: 1.0000 | ORAL_TABLET | Freq: Two times a day (BID) | ORAL | 0 refills | Status: AC

## 2020-01-06 NOTE — Telephone Encounter (Signed)
Patient called stating that she has developed a fine rash on both of her legs and is itching severe. Symptoms started with some itching on both lower legs x 2 days ago. She has been using cream like Eucerin. Then symptoms progressed to pink color of her skin from 1/2 way calf area down both legs. She is now also having a fine rash especially in the back of her legs, red and severe severe itching. Some warmth present to her legs. She had cellulitis in January but this time legs are not as swollen or red like they were. No fever. Spoke with Clarene Reamer, NP and scheduled patient to be seen with her for today 01/06/20 at 4:15 pm.

## 2020-01-06 NOTE — Progress Notes (Signed)
Subjective:    Patient ID: Meredith Castillo, female    DOB: Jul 22, 1981, 39 y.o.   MRN: [id number]  HPI Chief Complaint  Patient presents with  . Rash    red itchy rash on lower legs patient noticed yesterday. Also states that second toe on left foot feels numb    This is a 39 yo female who presents today with above cc. Legs started itching a couple of days ago. She applied "baby Eucerin," and lotion because she thought her legs were dry. No new products, soaps, detergents. Has had a hard time avoiding scratching. Increased swelling today, has not been able to put feet up.  She denies fever or pain.  She was treated January 2021 with cellulitis of both legs.  She reports at that time that she had more severe swelling, warmth and pain.  She notes some redness of her umbilicus.  In January she had an infection of her umbilicus that cultured positive for E. coli.  She was treated with injection of Rocephin and course of Bactrim. She has been applying mupirocin.  She reports that her third toe of her left foot has had some numbness and tingling for a couple of weeks at the end.  She thought she had an ingrown nail but does not feel that she does.  Review of Systems Per HPI    Objective:   Physical Exam Vitals reviewed.  Constitutional:      General: She is not in acute distress.    Appearance: Normal appearance. She is obese. She is not ill-appearing, toxic-appearing or diaphoretic.  HENT:     Head: Normocephalic and atraumatic.  Cardiovascular:     Rate and Rhythm: Normal rate.  Pulmonary:     Effort: Pulmonary effort is normal.  Musculoskeletal:     Right lower leg: Edema (1+) present.     Left lower leg: Edema (1+) present.  Skin:    General: Skin is warm and dry.     Capillary Refill: Capillary refill takes less than 2 seconds.     Comments: Bilateral lower extremities with obvious edema, erythema to just below the knee.  No palpable lesions.  Skin blanches readily.  No  warmth.  Umbilicus with small amount of erythema at base.  No discharge.  Neurological:     Mental Status: She is alert and oriented to person, place, and time.       BP (!) 156/94   Pulse 88   Temp 98.1 F (36.7 C) (Tympanic)   Ht 5\' 7"  (1.702 m)   Wt (!) 304 lb (137.9 kg)   SpO2 96%   BMI 47.61 kg/m  Wt Readings from Last 3 Encounters:  01/06/20 (!) 304 lb (137.9 kg)  10/19/19 (!) 328 lb (148.8 kg)  01/20/19 294 lb 4 oz (133.5 kg)       Assessment & Plan:  Discussed with Dr. Damita Dunnings who also examined the patient 1. Rash and nonspecific skin eruption -Do not suspect infectious cause of rash.  Given itching and lack of warmth, suspect irritative process. -Discussed this with patient.  She will try steroid lotion topically for 24 to 48 hours.  If worsening, I have also sent in an antibiotic that she can have filled.  If any systemic symptoms develop or severe swelling, fever, redness, she was instructed to notify on-call physician or go to ER. - triamcinolone lotion (KENALOG) 0.1 %; Apply 1 application topically 2 (two) times daily. For no more than 10 days.  Dispense: 60 mL; Refill: 1 - sulfamethoxazole-trimethoprim (BACTRIM DS) 800-160 MG tablet; Take 1 tablet by mouth 2 (two) times daily for 7 days.  Dispense: 14 tablet; Refill: 0  2. Bilateral leg edema -Likely related to #1, will give her a short-term diuretic prescription and have encouraged her to elevate her legs - furosemide (LASIX) 20 MG tablet; Take 1 tablet (20 mg total) by mouth daily.  Dispense: 5 tablet; Refill: 0  3. Candidiasis - nystatin ointment (MYCOSTATIN); Apply 1 application topically 2 (two) times daily.  Dispense: 30 g; Refill: 0  4. Numbness of toe -Unclear etiology, she does have a callus on the bottom of her third left toe.  This will need to be explored further if symptoms persist  This visit occurred during the SARS-CoV-2 public health emergency.  Safety protocols were in place, including  screening questions prior to the visit, additional usage of staff PPE, and extensive cleaning of exam room while observing appropriate contact time as indicated for disinfecting solutions.     Clarene Reamer, FNP-BC  [facility]  01/06/2020 5:40 PM

## 2020-01-06 NOTE — Telephone Encounter (Signed)
Noted  

## 2020-01-06 NOTE — Patient Instructions (Addendum)
Good to see you today  We think you have more of an irritation instead of infection of your legs  I have sent in furosemide (Lasix) for your leg swelling. Can take daily for 3 days, if no significant improvement, call the office.   I have sent in a lotion for inflammation of your legs- apply a thin layer twice a day for no more than 10 days. Use gentle soap and warm (not hot) water and pat dry gently. If no improvement in 24 hours, can start antibiotic that was also sent to pharmacy. You will need to call to have that filled.   If you get worse, please call on call doctor.   For your belly button, I have sent in nystatin ointment, dry thoroughly after bathing

## 2020-03-20 ENCOUNTER — Other Ambulatory Visit: Payer: Self-pay

## 2020-03-20 ENCOUNTER — Telehealth: Payer: Self-pay | Admitting: Psychiatry

## 2020-03-20 DIAGNOSIS — F3342 Major depressive disorder, recurrent, in full remission: Secondary | ICD-10-CM

## 2020-03-20 DIAGNOSIS — F5081 Binge eating disorder: Secondary | ICD-10-CM

## 2020-03-20 MED ORDER — LISDEXAMFETAMINE DIMESYLATE 40 MG PO CAPS: 40.0000 mg | ORAL_CAPSULE | Freq: Every day | ORAL | 0 refills | Status: DC

## 2020-03-20 NOTE — Telephone Encounter (Signed)
Pt requesting refill for Vyvanse @ [facility]. Apt 8/10

## 2020-03-20 NOTE — Telephone Encounter (Signed)
Last refill 02/15/2020, pended for Dr. Creig Hines to review and send. Next apt is 05/08/20

## 2020-04-09 DIAGNOSIS — Z1329 Encounter for screening for other suspected endocrine disorder: Secondary | ICD-10-CM | POA: Diagnosis not present

## 2020-04-09 DIAGNOSIS — Z130 Encounter for screening for diseases of the blood and blood-forming organs and certain disorders involving the immune mechanism: Secondary | ICD-10-CM | POA: Diagnosis not present

## 2020-04-09 DIAGNOSIS — Z1231 Encounter for screening mammogram for malignant neoplasm of breast: Secondary | ICD-10-CM | POA: Diagnosis not present

## 2020-04-09 DIAGNOSIS — Z01419 Encounter for gynecological examination (general) (routine) without abnormal findings: Secondary | ICD-10-CM | POA: Diagnosis not present

## 2020-04-09 DIAGNOSIS — Z6841 Body Mass Index (BMI) 40.0 and over, adult: Secondary | ICD-10-CM | POA: Diagnosis not present

## 2020-04-09 DIAGNOSIS — Z1151 Encounter for screening for human papillomavirus (HPV): Secondary | ICD-10-CM | POA: Diagnosis not present

## 2020-04-09 DIAGNOSIS — Z0000 Encounter for general adult medical examination without abnormal findings: Secondary | ICD-10-CM | POA: Diagnosis not present

## 2020-04-09 DIAGNOSIS — Z131 Encounter for screening for diabetes mellitus: Secondary | ICD-10-CM | POA: Diagnosis not present

## 2020-04-09 DIAGNOSIS — Z13220 Encounter for screening for lipoid disorders: Secondary | ICD-10-CM | POA: Diagnosis not present

## 2020-04-09 LAB — HM PAP SMEAR
HM Pap smear: ABNORMAL
HPV, high-risk: POSITIVE

## 2020-04-17 ENCOUNTER — Other Ambulatory Visit: Payer: Self-pay

## 2020-04-17 ENCOUNTER — Telehealth: Payer: Self-pay | Admitting: Psychiatry

## 2020-04-17 DIAGNOSIS — F5081 Binge eating disorder: Secondary | ICD-10-CM

## 2020-04-17 DIAGNOSIS — F3342 Major depressive disorder, recurrent, in full remission: Secondary | ICD-10-CM

## 2020-04-17 MED ORDER — LISDEXAMFETAMINE DIMESYLATE 40 MG PO CAPS: 40.0000 mg | ORAL_CAPSULE | Freq: Every day | ORAL | 0 refills | Status: DC

## 2020-04-17 NOTE — Telephone Encounter (Signed)
Last refill 03/20/20 Pended for Dr. Creig Hines to review and send

## 2020-04-17 NOTE — Telephone Encounter (Signed)
Patient called and needs a refill on her vyvanse 40 mg to [facility] [place]. Next appt 8/10

## 2020-04-17 NOTE — Telephone Encounter (Signed)
[place] registry documents last dispensing 03/20/2020 now appropriate to send the next Vyvanse 40 mg every morning #30 medically necessary no contraindication with registry over the last year appropriate for continuing current treatment having her next appointment August 1

## 2020-04-23 ENCOUNTER — Telehealth: Payer: Self-pay | Admitting: *Deleted

## 2020-04-23 DIAGNOSIS — M25561 Pain in right knee: Secondary | ICD-10-CM | POA: Diagnosis not present

## 2020-04-23 DIAGNOSIS — M25562 Pain in left knee: Secondary | ICD-10-CM | POA: Diagnosis not present

## 2020-04-23 NOTE — Telephone Encounter (Signed)
Agree.  There is no way I can work her in urgently today.

## 2020-04-23 NOTE — Telephone Encounter (Signed)
Patient called stating that [place]. Patient stated her knee is swollen, painful and she can hardly walk on it. Patient stated that she is using ice, a brace and ibuprofen. After speaking to Dr. Lorelei Pont patient was advised to go to [facility] today when they open at 5:30. Address and hours were given to patient and she verbalized understanding.  Patient was advised to continue the ice, elevation and Ibuprofen per Dr. Lorelei Pont and she verbalized understanding.

## 2020-04-27 DIAGNOSIS — Z30433 Encounter for removal and reinsertion of intrauterine contraceptive device: Secondary | ICD-10-CM | POA: Diagnosis not present

## 2020-05-01 DIAGNOSIS — M6281 Muscle weakness (generalized): Secondary | ICD-10-CM | POA: Diagnosis not present

## 2020-05-01 DIAGNOSIS — S8391XD Sprain of unspecified site of right knee, subsequent encounter: Secondary | ICD-10-CM | POA: Diagnosis not present

## 2020-05-01 DIAGNOSIS — R262 Difficulty in walking, not elsewhere classified: Secondary | ICD-10-CM | POA: Diagnosis not present

## 2020-05-01 DIAGNOSIS — M25661 Stiffness of right knee, not elsewhere classified: Secondary | ICD-10-CM | POA: Diagnosis not present

## 2020-05-04 DIAGNOSIS — R87810 Cervical high risk human papillomavirus (HPV) DNA test positive: Secondary | ICD-10-CM | POA: Diagnosis not present

## 2020-05-04 DIAGNOSIS — N870 Mild cervical dysplasia: Secondary | ICD-10-CM | POA: Diagnosis not present

## 2020-05-04 DIAGNOSIS — B372 Candidiasis of skin and nail: Secondary | ICD-10-CM | POA: Diagnosis not present

## 2020-05-04 DIAGNOSIS — R87612 Low grade squamous intraepithelial lesion on cytologic smear of cervix (LGSIL): Secondary | ICD-10-CM | POA: Diagnosis not present

## 2020-05-08 ENCOUNTER — Other Ambulatory Visit: Payer: Self-pay

## 2020-05-08 ENCOUNTER — Ambulatory Visit (INDEPENDENT_AMBULATORY_CARE_PROVIDER_SITE_OTHER): Payer: BC Managed Care – PPO | Admitting: Psychiatry

## 2020-05-08 ENCOUNTER — Encounter: Payer: Self-pay | Admitting: Psychiatry

## 2020-05-08 VITALS — Ht 67.5 in | Wt 309.0 lb

## 2020-05-08 DIAGNOSIS — F411 Generalized anxiety disorder: Secondary | ICD-10-CM | POA: Diagnosis not present

## 2020-05-08 DIAGNOSIS — F5081 Binge eating disorder: Secondary | ICD-10-CM

## 2020-05-08 DIAGNOSIS — E282 Polycystic ovarian syndrome: Secondary | ICD-10-CM

## 2020-05-08 DIAGNOSIS — F3342 Major depressive disorder, recurrent, in full remission: Secondary | ICD-10-CM | POA: Diagnosis not present

## 2020-05-08 MED ORDER — LISDEXAMFETAMINE DIMESYLATE 40 MG PO CAPS: 40.0000 mg | ORAL_CAPSULE | Freq: Every day | ORAL | 0 refills | Status: DC

## 2020-05-08 MED ORDER — DIAZEPAM 10 MG PO TABS: 10.0000 mg | ORAL_TABLET | Freq: Two times a day (BID) | ORAL | 1 refills | Status: DC | PRN

## 2020-05-08 MED ORDER — PAROXETINE HCL 30 MG PO TABS: 30.0000 mg | ORAL_TABLET | Freq: Every day | ORAL | 3 refills | Status: DC

## 2020-05-08 NOTE — Progress Notes (Signed)
[facility]  Patient ID: Meredith Castillo,  MRN: [id number]  PCP: Owens Loffler, MD  Date of Evaluation: 05/08/2020 Time spent:20 minutes from 1545 to [place] Complaint:  Chief Complaint    Depression; Anxiety; Eating Disorder      HISTORY/CURRENT STATUS: Meredith Castillo is seen onsite in office 20 minutes individually face-to-face with consent with epic collateral for psychiatric interview and exam in 41-month evaluation and management of major depression, generalized anxiety, binge eating disorder, and PCOS.  She reports improved control of overeating and containing emotional triggers on Vyvanse 40 mg most mornings.  She continues Paxil 30 mg at night and Valium 10 mgup to twice daily as needed for anxiety.  She continues to work from home with daughters ages 56 and 23 years as the parents of deceased fianc who provide the home in which she lives have been months stressful to her lately.  [place] registry documents last Valium dispensing on July 30 and Vyvanse July 20 of this year.  She has no mania, suicidality, psychosis or delirium.   Depression The patient presents withdepressionas a recurrentproblem.The most recent episodestartedmore than 2 year ago and is resolving in a stuttering fashion relative to course of environmental stressors and diathesis. The onset quality is sudden. The problem occurs intermittently.The problemisnowinresolution.Associated symptoms include appetite change,binge overeating, decreased concentration,fatigue,and insomnia. Associated symptoms include no helplessness,no hopelessness,no irritability,no restlessness,no decreased interest,no sadness,and no suicidal ideas.The symptoms are aggravated by work stress, social issues and family issues.Past treatments include SSRIs - Selective serotonin reuptake inhibitors, other medications and psychotherapy.Compliance with treatment is good.Past compliance problems include  difficulty with treatment plan, medication issues and medical issues.Risk factors include family history, family history of mental illness, history of mental illness, major life event and stress. Past medical history includes chronic illness,anxiety,depressionand mental health disorder. Pertinent negatives include no life-threatening condition,no recent psychiatric admission,no bipolar disorder,no eating disorder,no obsessive-compulsive disorder,no post-traumatic stress disorder,no schizophrenia,no suicide attemptsand no head trauma  Individual Medical History/ Review of Systems: Changes? :Yes Weight is essentially unchanged in 6 months though not increased.  Mirena has been replaced for PCOS.  She did require culposcopy for abnormal Pap with biopsy results pending.  GYN added folic acid.  She has an acute sprain of the right knee from [place] with physical therapy concluding lax ligaments treated currently with Voltaren gel and tramadol as she undergoes patellar mechanism strengthening.  Allergies: Vancomycin and Neosporin [neomycin-polymyxin-gramicidin]  Current Medications:  Current Outpatient Medications:  .  albuterol (PROVENTIL HFA;VENTOLIN HFA) 108 (90 BASE) MCG/ACT inhaler, Inhale 2 puffs into the lungs every 4 (four) hours as needed for wheezing or shortness of breath., Disp: 1 Inhaler, Rfl: 3 .  cetirizine (ZYRTEC) 10 MG tablet, Take 10 mg by mouth daily., Disp: , Rfl:  .  diazepam (VALIUM) 10 MG tablet, Take 1 tablet (10 mg total) by mouth 2 (two) times daily as needed for anxiety., Disp: 180 tablet, Rfl: 1 .  fluconazole (DIFLUCAN) 150 MG tablet, 1 tab po every other day (Patient not taking: Reported on 01/06/2020), Disp: 3 tablet, Rfl: 0 .  fluticasone (FLONASE) 50 MCG/ACT nasal spray, Place 1 spray into both nostrils daily as needed for allergies., Disp: , Rfl:  .  furosemide (LASIX) 20 MG tablet, Take 1 tablet (20 mg total) by mouth  daily., Disp: 5 tablet, Rfl: 0 .  ibuprofen (ADVIL,MOTRIN) 200 MG tablet, Take 400-800 mg by mouth every 8 (eight) hours as needed for headache or  moderate pain. , Disp: , Rfl:  .  levonorgestrel (MIRENA) 20 MCG/24HR IUD, 1 each by Intrauterine route once., Disp: , Rfl:  .  [START ON 05/17/2020] lisdexamfetamine (VYVANSE) 40 MG capsule, Take 1 capsule (40 mg total) by mouth daily after breakfast., Disp: 30 capsule, Rfl: 0 .  [START ON 06/16/2020] lisdexamfetamine (VYVANSE) 40 MG capsule, Take 1 capsule (40 mg total) by mouth daily after breakfast., Disp: 30 capsule, Rfl: 0 .  [START ON 07/16/2020] lisdexamfetamine (VYVANSE) 40 MG capsule, Take 1 capsule (40 mg total) by mouth daily after breakfast., Disp: 30 capsule, Rfl: 0 .  Multiple Vitamins-Minerals (MULTIVITAMIN WITH MINERALS) tablet, Take 1 tablet by mouth daily., Disp: , Rfl:  .  mupirocin ointment (BACTROBAN) 2 %, Place 1 application into the nose as needed (for wound care)., Disp: 22 g, Rfl: 1 .  nystatin ointment (MYCOSTATIN), Apply 1 application topically 2 (two) times daily., Disp: 30 g, Rfl: 0 .  PARoxetine (PAXIL) 30 MG tablet, Take 1 tablet (30 mg total) by mouth daily at 10 pm., Disp: 90 tablet, Rfl: 3 .  promethazine (PHENERGAN) 25 MG tablet, TAKE 1 TABLET BY MOUTH EVERY 6 HOURS AS NEEDED FOR NAUSEA AND VOMITING (Patient not taking: Reported on 01/06/2020), Disp: 30 tablet, Rfl: 2 .  traZODone (DESYREL) 50 MG tablet, Take 1 tablet (50 mg total) by mouth at bedtime., Disp: 90 tablet, Rfl: 3 .  triamcinolone lotion (KENALOG) 0.1 %, Apply 1 application topically 2 (two) times daily. For no more than 10 days., Disp: 60 mL, Rfl: 1 .  valACYclovir (VALTREX) 1000 MG tablet, Take 2 tablets (2,000 mg total) by mouth 2 (two) times daily., Disp: 4 tablet, Rfl: 1  Medication Side Effects: none  Family Medical/ Social History: Changes? No  MENTAL HEALTH EXAM:  Height 5' 7.5" (1.715 m), weight (!) 309 lb (140.2 kg).Body mass index is 47.68 kg/m.  Muscle strengths and tone 5/5, postural reflexes and gait 0/0, and AIMS = 0. Unable to weigh here due to knee pain and brace  General Appearance: Casual, Meticulous, Well Groomed and Obese  Eye Contact:  Good  Speech:  Clear and Coherent, Normal Rate and Talkative  Volume:  Normal  Mood:  Anxious and Euthymic  Affect:  Congruent, Inappropriate, Full Range and Anxious  Thought Process:  Coherent, Goal Directed, Irrelevant and Descriptions of Associations: Tangential  Orientation:  Full (Time, Place, and Person)  Thought Content: Rumination and Tangential   Suicidal Thoughts:  No  Homicidal Thoughts:  No  Memory:  Immediate;   Good Remote;   Good  Judgement:  Fair  Insight:  Fair  Psychomotor Activity:  Normal, Decreased and Mannerisms  Concentration:  Concentration: Fair and Attention Span: Good  Recall:  Good  Fund of Knowledge: Good  Language: Good  Assets:  Desire for Improvement Intimacy Resilience Social Support Vocational/Educational  ADL's:  Intact  Cognition: WNL  Prognosis:  Good    DIAGNOSES:    ICD-10-CM   1. Major depressive disorder, recurrent episode, in full remission ([place])  F33.42 lisdexamfetamine (VYVANSE) 40 MG capsule    diazepam (VALIUM) 10 MG tablet    PARoxetine (PAXIL) 30 MG tablet  2. Generalized anxiety disorder  F41.1 diazepam (VALIUM) 10 MG tablet    PARoxetine (PAXIL) 30 MG tablet  3. Binge eating disorder  F50.81 lisdexamfetamine (VYVANSE) 40 MG capsule    lisdexamfetamine (VYVANSE) 40 MG capsule    lisdexamfetamine (VYVANSE) 40 MG capsule  4. PCOS (polycystic ovarian syndrome)  E28.2  Receiving Psychotherapy: No    RECOMMENDATIONS: Psychosupportive psychoeducation for CBT for behavioral nutrition, sleep hygiene, social problem-solving, and frustration management updates overcoming loss and coping with ongoing environment.  Strengths are reinforced and her accomplishments are many.  She is E scribed Vyvanse 40 mg every morning sent as a  30-day supply each for August 19, September 18, and October 18 for binge eating disorder and depression to [facility].  He has E scribed Paxil 30 mg every evening at bedtime sent as #90 with 3 refills to [facility] for depression, anxiety, and binge overeating.  She is E scribed Valium 10 mg twice daily as needed for anxiety or insomnia sent as #180 with 1 refill to [facility] for generalized anxiety.  She has current supply if needed of trazodone 50 mg nightly for insomnia.  She agrees to onsider follow-up in 6 months or sooner if needed being updated on prevention and monitoring safety hygiene regarding medications.   Delight Hoh, MD

## 2020-05-10 DIAGNOSIS — R262 Difficulty in walking, not elsewhere classified: Secondary | ICD-10-CM | POA: Diagnosis not present

## 2020-05-10 DIAGNOSIS — M6281 Muscle weakness (generalized): Secondary | ICD-10-CM | POA: Diagnosis not present

## 2020-05-10 DIAGNOSIS — M25661 Stiffness of right knee, not elsewhere classified: Secondary | ICD-10-CM | POA: Diagnosis not present

## 2020-05-10 DIAGNOSIS — S8391XD Sprain of unspecified site of right knee, subsequent encounter: Secondary | ICD-10-CM | POA: Diagnosis not present

## 2020-05-15 DIAGNOSIS — R262 Difficulty in walking, not elsewhere classified: Secondary | ICD-10-CM | POA: Diagnosis not present

## 2020-05-15 DIAGNOSIS — S8391XD Sprain of unspecified site of right knee, subsequent encounter: Secondary | ICD-10-CM | POA: Diagnosis not present

## 2020-05-15 DIAGNOSIS — M6281 Muscle weakness (generalized): Secondary | ICD-10-CM | POA: Diagnosis not present

## 2020-05-15 DIAGNOSIS — M25661 Stiffness of right knee, not elsewhere classified: Secondary | ICD-10-CM | POA: Diagnosis not present

## 2020-05-17 DIAGNOSIS — M6281 Muscle weakness (generalized): Secondary | ICD-10-CM | POA: Diagnosis not present

## 2020-05-17 DIAGNOSIS — M25561 Pain in right knee: Secondary | ICD-10-CM | POA: Diagnosis not present

## 2020-05-17 DIAGNOSIS — M25661 Stiffness of right knee, not elsewhere classified: Secondary | ICD-10-CM | POA: Diagnosis not present

## 2020-05-17 DIAGNOSIS — R262 Difficulty in walking, not elsewhere classified: Secondary | ICD-10-CM | POA: Diagnosis not present

## 2020-05-23 DIAGNOSIS — R262 Difficulty in walking, not elsewhere classified: Secondary | ICD-10-CM | POA: Diagnosis not present

## 2020-05-23 DIAGNOSIS — M6281 Muscle weakness (generalized): Secondary | ICD-10-CM | POA: Diagnosis not present

## 2020-05-23 DIAGNOSIS — M25561 Pain in right knee: Secondary | ICD-10-CM | POA: Diagnosis not present

## 2020-05-23 DIAGNOSIS — M25661 Stiffness of right knee, not elsewhere classified: Secondary | ICD-10-CM | POA: Diagnosis not present

## 2020-05-24 DIAGNOSIS — M25561 Pain in right knee: Secondary | ICD-10-CM | POA: Diagnosis not present

## 2020-05-24 DIAGNOSIS — M25562 Pain in left knee: Secondary | ICD-10-CM | POA: Diagnosis not present

## 2020-05-29 DIAGNOSIS — M25661 Stiffness of right knee, not elsewhere classified: Secondary | ICD-10-CM | POA: Diagnosis not present

## 2020-05-29 DIAGNOSIS — M25561 Pain in right knee: Secondary | ICD-10-CM | POA: Diagnosis not present

## 2020-05-29 DIAGNOSIS — M6281 Muscle weakness (generalized): Secondary | ICD-10-CM | POA: Diagnosis not present

## 2020-05-29 DIAGNOSIS — R262 Difficulty in walking, not elsewhere classified: Secondary | ICD-10-CM | POA: Diagnosis not present

## 2020-05-31 DIAGNOSIS — M25561 Pain in right knee: Secondary | ICD-10-CM | POA: Diagnosis not present

## 2020-05-31 DIAGNOSIS — S8391XA Sprain of unspecified site of right knee, initial encounter: Secondary | ICD-10-CM | POA: Diagnosis not present

## 2020-05-31 DIAGNOSIS — M25461 Effusion, right knee: Secondary | ICD-10-CM | POA: Diagnosis not present

## 2020-06-06 DIAGNOSIS — M25661 Stiffness of right knee, not elsewhere classified: Secondary | ICD-10-CM | POA: Diagnosis not present

## 2020-06-06 DIAGNOSIS — M6281 Muscle weakness (generalized): Secondary | ICD-10-CM | POA: Diagnosis not present

## 2020-06-06 DIAGNOSIS — M25561 Pain in right knee: Secondary | ICD-10-CM | POA: Diagnosis not present

## 2020-06-06 DIAGNOSIS — R262 Difficulty in walking, not elsewhere classified: Secondary | ICD-10-CM | POA: Diagnosis not present

## 2020-06-07 DIAGNOSIS — R262 Difficulty in walking, not elsewhere classified: Secondary | ICD-10-CM | POA: Diagnosis not present

## 2020-06-07 DIAGNOSIS — M6281 Muscle weakness (generalized): Secondary | ICD-10-CM | POA: Diagnosis not present

## 2020-06-07 DIAGNOSIS — M25661 Stiffness of right knee, not elsewhere classified: Secondary | ICD-10-CM | POA: Diagnosis not present

## 2020-06-07 DIAGNOSIS — M25561 Pain in right knee: Secondary | ICD-10-CM | POA: Diagnosis not present

## 2020-06-19 DIAGNOSIS — M25561 Pain in right knee: Secondary | ICD-10-CM | POA: Diagnosis not present

## 2020-06-19 DIAGNOSIS — M25661 Stiffness of right knee, not elsewhere classified: Secondary | ICD-10-CM | POA: Diagnosis not present

## 2020-06-19 DIAGNOSIS — M6281 Muscle weakness (generalized): Secondary | ICD-10-CM | POA: Diagnosis not present

## 2020-06-19 DIAGNOSIS — R262 Difficulty in walking, not elsewhere classified: Secondary | ICD-10-CM | POA: Diagnosis not present

## 2020-06-21 DIAGNOSIS — M6281 Muscle weakness (generalized): Secondary | ICD-10-CM | POA: Diagnosis not present

## 2020-06-21 DIAGNOSIS — M25661 Stiffness of right knee, not elsewhere classified: Secondary | ICD-10-CM | POA: Diagnosis not present

## 2020-06-21 DIAGNOSIS — R262 Difficulty in walking, not elsewhere classified: Secondary | ICD-10-CM | POA: Diagnosis not present

## 2020-06-21 DIAGNOSIS — M25561 Pain in right knee: Secondary | ICD-10-CM | POA: Diagnosis not present

## 2020-06-25 DIAGNOSIS — M25561 Pain in right knee: Secondary | ICD-10-CM | POA: Diagnosis not present

## 2020-07-03 DIAGNOSIS — M25661 Stiffness of right knee, not elsewhere classified: Secondary | ICD-10-CM | POA: Diagnosis not present

## 2020-07-03 DIAGNOSIS — M25561 Pain in right knee: Secondary | ICD-10-CM | POA: Diagnosis not present

## 2020-07-03 DIAGNOSIS — M6281 Muscle weakness (generalized): Secondary | ICD-10-CM | POA: Diagnosis not present

## 2020-07-03 DIAGNOSIS — R262 Difficulty in walking, not elsewhere classified: Secondary | ICD-10-CM | POA: Diagnosis not present

## 2020-07-05 DIAGNOSIS — M6281 Muscle weakness (generalized): Secondary | ICD-10-CM | POA: Diagnosis not present

## 2020-07-05 DIAGNOSIS — M25661 Stiffness of right knee, not elsewhere classified: Secondary | ICD-10-CM | POA: Diagnosis not present

## 2020-07-05 DIAGNOSIS — R262 Difficulty in walking, not elsewhere classified: Secondary | ICD-10-CM | POA: Diagnosis not present

## 2020-07-05 DIAGNOSIS — M25561 Pain in right knee: Secondary | ICD-10-CM | POA: Diagnosis not present

## 2020-07-19 ENCOUNTER — Encounter: Payer: Self-pay | Admitting: Psychiatry

## 2020-08-06 DIAGNOSIS — M25561 Pain in right knee: Secondary | ICD-10-CM | POA: Diagnosis not present

## 2020-08-09 DIAGNOSIS — M25561 Pain in right knee: Secondary | ICD-10-CM | POA: Diagnosis not present

## 2020-08-15 DIAGNOSIS — M25561 Pain in right knee: Secondary | ICD-10-CM | POA: Diagnosis not present

## 2020-08-17 ENCOUNTER — Telehealth: Payer: Self-pay

## 2020-08-17 DIAGNOSIS — B0052 Herpesviral keratitis: Secondary | ICD-10-CM | POA: Diagnosis not present

## 2020-08-17 NOTE — Telephone Encounter (Signed)
Very good.  This is an emergent opthalmological issue.

## 2020-08-17 NOTE — Telephone Encounter (Signed)
Pt left v/m that she has herpes in lt eye again and pt had referral to [facility] last year and pt said [facility] said her doctor was not longer with them and to call PCP. Pt said lt eye is not as red as last year but pt vision in lt eye is very blurred. I called [facility] and spoke with Bella Kennedy who said she would send the note to one of the nurses and pt would get cb with probable work in today. I notified pt and she voiced understanding and I advised if she did not hear from [facility] to call them back at [phone] or go to [facility]. Pt voiced understanding and said she was getting a call from [facility] now. Stopped talking so pt could answer phone. FYI to Dr Lorelei Pont. I spoke with pt;Pt has appt with [place] today at 3:00pm.

## 2020-08-21 DIAGNOSIS — M1711 Unilateral primary osteoarthritis, right knee: Secondary | ICD-10-CM | POA: Diagnosis not present

## 2020-08-21 DIAGNOSIS — R262 Difficulty in walking, not elsewhere classified: Secondary | ICD-10-CM | POA: Diagnosis not present

## 2020-08-21 DIAGNOSIS — M6281 Muscle weakness (generalized): Secondary | ICD-10-CM | POA: Diagnosis not present

## 2020-08-21 DIAGNOSIS — M25561 Pain in right knee: Secondary | ICD-10-CM | POA: Diagnosis not present

## 2020-08-27 ENCOUNTER — Telehealth: Payer: Self-pay | Admitting: Psychiatry

## 2020-08-27 ENCOUNTER — Other Ambulatory Visit: Payer: Self-pay

## 2020-08-27 DIAGNOSIS — F5081 Binge eating disorder: Secondary | ICD-10-CM

## 2020-08-27 MED ORDER — LISDEXAMFETAMINE DIMESYLATE 40 MG PO CAPS: 40.0000 mg | ORAL_CAPSULE | Freq: Every day | ORAL | 0 refills | Status: DC

## 2020-08-27 NOTE — Telephone Encounter (Signed)
3 escriptions from appointment 05/08/2020 but have been dispensed last on 07/24/2028 now due for next single month supply eScription sent #30 of the Vyvanse 40 mg is now sent to [facility] medically necessary no contraindication in the last year epic or registry.

## 2020-08-27 NOTE — Telephone Encounter (Signed)
Pended for Dr. Creig Hines to send Last refill 07/24/20

## 2020-08-27 NOTE — Telephone Encounter (Signed)
Pt would like a refill on Vyvanse. [facility] [place].

## 2020-09-04 DIAGNOSIS — M1711 Unilateral primary osteoarthritis, right knee: Secondary | ICD-10-CM | POA: Diagnosis not present

## 2020-09-04 DIAGNOSIS — M6281 Muscle weakness (generalized): Secondary | ICD-10-CM | POA: Diagnosis not present

## 2020-09-04 DIAGNOSIS — R262 Difficulty in walking, not elsewhere classified: Secondary | ICD-10-CM | POA: Diagnosis not present

## 2020-09-04 DIAGNOSIS — M25561 Pain in right knee: Secondary | ICD-10-CM | POA: Diagnosis not present

## 2020-09-11 DIAGNOSIS — R262 Difficulty in walking, not elsewhere classified: Secondary | ICD-10-CM | POA: Diagnosis not present

## 2020-09-11 DIAGNOSIS — M1711 Unilateral primary osteoarthritis, right knee: Secondary | ICD-10-CM | POA: Diagnosis not present

## 2020-09-11 DIAGNOSIS — M6281 Muscle weakness (generalized): Secondary | ICD-10-CM | POA: Diagnosis not present

## 2020-09-11 DIAGNOSIS — M25561 Pain in right knee: Secondary | ICD-10-CM | POA: Diagnosis not present

## 2020-09-12 DIAGNOSIS — M1711 Unilateral primary osteoarthritis, right knee: Secondary | ICD-10-CM | POA: Diagnosis not present

## 2020-09-18 DIAGNOSIS — R262 Difficulty in walking, not elsewhere classified: Secondary | ICD-10-CM | POA: Diagnosis not present

## 2020-09-18 DIAGNOSIS — M1711 Unilateral primary osteoarthritis, right knee: Secondary | ICD-10-CM | POA: Diagnosis not present

## 2020-09-18 DIAGNOSIS — M25561 Pain in right knee: Secondary | ICD-10-CM | POA: Diagnosis not present

## 2020-09-18 DIAGNOSIS — M6281 Muscle weakness (generalized): Secondary | ICD-10-CM | POA: Diagnosis not present

## 2020-09-20 ENCOUNTER — Telehealth: Payer: Self-pay | Admitting: Psychiatry

## 2020-09-20 ENCOUNTER — Other Ambulatory Visit: Payer: Self-pay | Admitting: Physician Assistant

## 2020-09-20 DIAGNOSIS — F5081 Binge eating disorder: Secondary | ICD-10-CM

## 2020-09-20 MED ORDER — LISDEXAMFETAMINE DIMESYLATE 40 MG PO CAPS: 40.0000 mg | ORAL_CAPSULE | Freq: Every day | ORAL | 0 refills | Status: DC

## 2020-09-20 NOTE — Telephone Encounter (Signed)
Rx was sent  

## 2020-09-20 NOTE — Telephone Encounter (Signed)
Pt is requesting RF of Vyvanse sent in to [facility]. Pt of Milana Huntsman. Appt 11/06/20 with Barnett Applebaum Mozingo to f/up.

## 2020-09-25 DIAGNOSIS — M25561 Pain in right knee: Secondary | ICD-10-CM | POA: Diagnosis not present

## 2020-09-25 DIAGNOSIS — R262 Difficulty in walking, not elsewhere classified: Secondary | ICD-10-CM | POA: Diagnosis not present

## 2020-09-25 DIAGNOSIS — M1711 Unilateral primary osteoarthritis, right knee: Secondary | ICD-10-CM | POA: Diagnosis not present

## 2020-09-25 DIAGNOSIS — M6281 Muscle weakness (generalized): Secondary | ICD-10-CM | POA: Diagnosis not present

## 2020-10-09 DIAGNOSIS — M1711 Unilateral primary osteoarthritis, right knee: Secondary | ICD-10-CM | POA: Diagnosis not present

## 2020-10-09 DIAGNOSIS — M6281 Muscle weakness (generalized): Secondary | ICD-10-CM | POA: Diagnosis not present

## 2020-10-09 DIAGNOSIS — M25561 Pain in right knee: Secondary | ICD-10-CM | POA: Diagnosis not present

## 2020-10-09 DIAGNOSIS — R262 Difficulty in walking, not elsewhere classified: Secondary | ICD-10-CM | POA: Diagnosis not present

## 2020-10-31 DIAGNOSIS — M545 Low back pain, unspecified: Secondary | ICD-10-CM | POA: Diagnosis not present

## 2020-10-31 DIAGNOSIS — M25551 Pain in right hip: Secondary | ICD-10-CM | POA: Diagnosis not present

## 2020-11-02 ENCOUNTER — Other Ambulatory Visit: Payer: Self-pay | Admitting: Adult Health

## 2020-11-02 ENCOUNTER — Telehealth: Payer: Self-pay | Admitting: Adult Health

## 2020-11-02 DIAGNOSIS — F5081 Binge eating disorder: Secondary | ICD-10-CM

## 2020-11-02 DIAGNOSIS — F3342 Major depressive disorder, recurrent, in full remission: Secondary | ICD-10-CM

## 2020-11-02 MED ORDER — LISDEXAMFETAMINE DIMESYLATE 40 MG PO CAPS: 40.0000 mg | ORAL_CAPSULE | Freq: Every day | ORAL | 0 refills | Status: DC

## 2020-11-02 NOTE — Telephone Encounter (Signed)
Pt requesting a refill on the Vyvanse due to it will run out before scheduled appt. [facility] [place]. Former pt of Dr Creig Hines, but a follow is scheduled for 2/8 with R Mozingo.

## 2020-11-02 NOTE — Telephone Encounter (Signed)
Script sent  

## 2020-11-06 ENCOUNTER — Ambulatory Visit: Payer: BC Managed Care – PPO | Admitting: Adult Health

## 2020-11-08 DIAGNOSIS — M2569 Stiffness of other specified joint, not elsewhere classified: Secondary | ICD-10-CM | POA: Diagnosis not present

## 2020-11-08 DIAGNOSIS — M5459 Other low back pain: Secondary | ICD-10-CM | POA: Diagnosis not present

## 2020-11-08 DIAGNOSIS — R262 Difficulty in walking, not elsewhere classified: Secondary | ICD-10-CM | POA: Diagnosis not present

## 2020-11-08 DIAGNOSIS — M6281 Muscle weakness (generalized): Secondary | ICD-10-CM | POA: Diagnosis not present

## 2020-11-14 DIAGNOSIS — M2569 Stiffness of other specified joint, not elsewhere classified: Secondary | ICD-10-CM | POA: Diagnosis not present

## 2020-11-14 DIAGNOSIS — M5459 Other low back pain: Secondary | ICD-10-CM | POA: Diagnosis not present

## 2020-11-14 DIAGNOSIS — R262 Difficulty in walking, not elsewhere classified: Secondary | ICD-10-CM | POA: Diagnosis not present

## 2020-11-14 DIAGNOSIS — M6281 Muscle weakness (generalized): Secondary | ICD-10-CM | POA: Diagnosis not present

## 2020-11-20 DIAGNOSIS — M5459 Other low back pain: Secondary | ICD-10-CM | POA: Diagnosis not present

## 2020-11-20 DIAGNOSIS — M6281 Muscle weakness (generalized): Secondary | ICD-10-CM | POA: Diagnosis not present

## 2020-11-20 DIAGNOSIS — R262 Difficulty in walking, not elsewhere classified: Secondary | ICD-10-CM | POA: Diagnosis not present

## 2020-11-20 DIAGNOSIS — M2569 Stiffness of other specified joint, not elsewhere classified: Secondary | ICD-10-CM | POA: Diagnosis not present

## 2020-11-21 DIAGNOSIS — M545 Low back pain, unspecified: Secondary | ICD-10-CM | POA: Diagnosis not present

## 2020-11-21 DIAGNOSIS — M25561 Pain in right knee: Secondary | ICD-10-CM | POA: Diagnosis not present

## 2020-11-27 DIAGNOSIS — M545 Low back pain, unspecified: Secondary | ICD-10-CM | POA: Diagnosis not present

## 2020-11-29 DIAGNOSIS — M545 Low back pain, unspecified: Secondary | ICD-10-CM | POA: Diagnosis not present

## 2020-12-04 DIAGNOSIS — M5416 Radiculopathy, lumbar region: Secondary | ICD-10-CM | POA: Diagnosis not present

## 2020-12-05 ENCOUNTER — Other Ambulatory Visit: Payer: Self-pay

## 2020-12-05 ENCOUNTER — Ambulatory Visit (INDEPENDENT_AMBULATORY_CARE_PROVIDER_SITE_OTHER): Payer: BC Managed Care – PPO | Admitting: Adult Health

## 2020-12-05 ENCOUNTER — Encounter: Payer: Self-pay | Admitting: Adult Health

## 2020-12-05 DIAGNOSIS — F5081 Binge eating disorder: Secondary | ICD-10-CM | POA: Diagnosis not present

## 2020-12-05 DIAGNOSIS — G4700 Insomnia, unspecified: Secondary | ICD-10-CM

## 2020-12-05 DIAGNOSIS — F411 Generalized anxiety disorder: Secondary | ICD-10-CM | POA: Diagnosis not present

## 2020-12-05 DIAGNOSIS — F3342 Major depressive disorder, recurrent, in full remission: Secondary | ICD-10-CM | POA: Diagnosis not present

## 2020-12-05 DIAGNOSIS — E282 Polycystic ovarian syndrome: Secondary | ICD-10-CM

## 2020-12-05 MED ORDER — TRAZODONE HCL 50 MG PO TABS: 50.0000 mg | ORAL_TABLET | Freq: Every day | ORAL | 3 refills | Status: DC

## 2020-12-05 MED ORDER — LISDEXAMFETAMINE DIMESYLATE 40 MG PO CAPS: 40.0000 mg | ORAL_CAPSULE | Freq: Every day | ORAL | 0 refills | Status: DC

## 2020-12-05 MED ORDER — PAROXETINE HCL 30 MG PO TABS
30.0000 mg | ORAL_TABLET | Freq: Every day | ORAL | 3 refills | Status: DC
Start: 2020-12-05 — End: 2021-12-05

## 2020-12-05 MED ORDER — DIAZEPAM 10 MG PO TABS: 10.0000 mg | ORAL_TABLET | Freq: Two times a day (BID) | ORAL | 0 refills | Status: DC | PRN

## 2020-12-05 NOTE — Progress Notes (Signed)
Meredith Castillo [id number] 10/17/1980 40 y.o.  Subjective:   Patient ID:  Meredith Castillo is a 40 y.o. (DOB 09-12-1981) female.  Chief Complaint: No chief complaint on file.   HPI Meredith Castillo presents to the office today for follow-up of PCOS, MDD, GAD, Binge eating disorder.  Describes mood today as "ok". Pleasant. Mood symptoms - reports "some" depression, anxiety, and irritability - "usually related to in-laws". Stating "I'm doing ok". Injured back over Christmas - carrying bikes in for daughter. Received 2 injections for pain yesterday. Feels like medications are working well for her. Stable interest and motivation. Taking medications as prescribed.  Energy levels stable throughout the day. Active, does not have a regular exercise routine.  Enjoys some usual interests and activities. Widowed. Lives alone with 2 daughters 32 and 22. Husband's family local and supportive. Appetite adequate - "not all that great". Weight loss - a few pounds. Sleeps well most nights. Averages 6.5 hours with Trazadone. Focus and concentration stable. Completing tasks. Managing aspects of household. Working as Administrator for [employer]. Denies SI or HI.  Denies AH or VH.  Previous medication trials: Unknown  Review of Systems:  Review of Systems  Musculoskeletal: Negative for gait problem.  Neurological: Negative for tremors.  Psychiatric/Behavioral:       Please refer to HPI    Medications: I have reviewed the patient's current medications.  Current Outpatient Medications  Medication Sig Dispense Refill  . albuterol (PROVENTIL HFA;VENTOLIN HFA) 108 (90 BASE) MCG/ACT inhaler Inhale 2 puffs into the lungs every 4 (four) hours as needed for wheezing or shortness of breath. 1 Inhaler 3  . cetirizine (ZYRTEC) 10 MG tablet Take 10 mg by mouth daily.    . diazepam (VALIUM) 10 MG tablet Take 1 tablet (10 mg total) by mouth 2 (two) times daily as needed for anxiety. 180 tablet 0  . fluconazole (DIFLUCAN) 150 MG  tablet 1 tab po every other day (Patient not taking: Reported on 01/06/2020) 3 tablet 0  . fluticasone (FLONASE) 50 MCG/ACT nasal spray Place 1 spray into both nostrils daily as needed for allergies.    . furosemide (LASIX) 20 MG tablet Take 1 tablet (20 mg total) by mouth daily. 5 tablet 0  . ibuprofen (ADVIL,MOTRIN) 200 MG tablet Take 400-800 mg by mouth every 8 (eight) hours as needed for headache or moderate pain.     Marland Kitchen levonorgestrel (MIRENA) 20 MCG/24HR IUD 1 each by Intrauterine route once.    . lisdexamfetamine (VYVANSE) 40 MG capsule Take 1 capsule (40 mg total) by mouth daily after breakfast. 30 capsule 0  . [START ON 01/02/2021] lisdexamfetamine (VYVANSE) 40 MG capsule Take 1 capsule (40 mg total) by mouth daily after breakfast. 30 capsule 0  . [START ON 01/30/2021] lisdexamfetamine (VYVANSE) 40 MG capsule Take 1 capsule (40 mg total) by mouth daily after breakfast. 30 capsule 0  . Multiple Vitamins-Minerals (MULTIVITAMIN WITH MINERALS) tablet Take 1 tablet by mouth daily.    . mupirocin ointment (BACTROBAN) 2 % Place 1 application into the nose as needed (for wound care). 22 g 1  . nystatin ointment (MYCOSTATIN) Apply 1 application topically 2 (two) times daily. 30 g 0  . PARoxetine (PAXIL) 30 MG tablet Take 1 tablet (30 mg total) by mouth daily at 10 pm. 90 tablet 3  . promethazine (PHENERGAN) 25 MG tablet TAKE 1 TABLET BY MOUTH EVERY 6 HOURS AS NEEDED FOR NAUSEA AND VOMITING (Patient not taking: Reported on 01/06/2020) 30 tablet 2  .  traZODone (DESYREL) 50 MG tablet Take 1 tablet (50 mg total) by mouth at bedtime. 90 tablet 3  . triamcinolone lotion (KENALOG) 0.1 % Apply 1 application topically 2 (two) times daily. For no more than 10 days. 60 mL 1  . valACYclovir (VALTREX) 1000 MG tablet Take 2 tablets (2,000 mg total) by mouth 2 (two) times daily. 4 tablet 1   No current facility-administered medications for this visit.    Medication Side Effects: None  Allergies:  Allergies  Allergen  Reactions  . Vancomycin Other (See Comments)    Patient states that when this medication is given intravenously, it causes her kidney's to shut down.  . Neosporin [Neomycin-Polymyxin-Gramicidin] Other (See Comments)    Patient states that this medication causes a poison ivy rash like reaction.    Past Medical History:  Diagnosis Date  . Asthma   . Bariatric surgery status complicating pregnancy, childbirth, or the puerperium, antepartum condition or complication 7096  . HTN (hypertension)   . Iron deficiency anemia    due to gastric bypass.   . Mild asthma 01/10/2014  . Morbid obesity with BMI of 40.0-44.9, adult ([place])   . PCOS (polycystic ovarian syndrome)   . PP care - s/p SVD 2/19 11/19/2011  . Unspecified vitamin D deficiency   . Vitamin B12 deficiency    due to gastric bypass.     Family History  Problem Relation Age of Onset  . Hypertension Mother   . Diabetes Mother   . Heart disease Mother   . Cancer Maternal Grandfather        brain  . Heart attack Paternal Grandfather   . Anesthesia problems Neg Hx     Social History   Socioeconomic History  . Marital status: Single    Spouse name: Not on file  . Number of children: 2  . Years of education: Not on file  . Highest education level: Not on file  Occupational History    Comment: [employer] employee.   Tobacco Use  . Smoking status: Former Smoker    Packs/day: 0.25    Quit date: 10/12/2005    Years since quitting: 15.1  . Smokeless tobacco: Never Used  Substance and Sexual Activity  . Alcohol use: No    Alcohol/week: 0.0 standard drinks  . Drug use: Yes  . Sexual activity: Not Currently    Partners: Male    Birth control/protection: I.U.D.    Comment: widow  Other Topics Concern  . Not on file  Social History Narrative   Widowed 2016- husband had pulmonary disease   2 kids.    Social Determinants of Health   Financial Resource Strain: Not on file  Food Insecurity: Not on file  Transportation  Needs: Not on file  Physical Activity: Not on file  Stress: Not on file  Social Connections: Not on file  Intimate Partner Violence: Not on file    Past Medical History, Surgical history, Social history, and Family history were reviewed and updated as appropriate.   Please see review of systems for further details on the patient's review from today.   Objective:   Physical Exam:  There were no vitals taken for this visit.  Physical Exam Constitutional:      General: She is not in acute distress. Musculoskeletal:        General: No deformity.  Neurological:     Mental Status: She is alert and oriented to person, place, and time.     Coordination: Coordination normal.  Psychiatric:        Attention and Perception: Attention and perception normal. She does not perceive auditory or visual hallucinations.        Mood and Affect: Mood normal. Mood is not anxious or depressed. Affect is not labile, blunt, angry or inappropriate.        Speech: Speech normal.        Behavior: Behavior normal.        Thought Content: Thought content normal. Thought content is not paranoid or delusional. Thought content does not include homicidal or suicidal ideation. Thought content does not include homicidal or suicidal plan.        Cognition and Memory: Cognition and memory normal.        Judgment: Judgment normal.     Comments: Insight intact     Lab Review:     Component Value Date/Time   NA 136 10/19/2019 1033   NA 140 01/05/2013 0939   K 4.6 10/19/2019 1033   K 4.7 01/05/2013 0939   CL 102 10/19/2019 1033   CL 106 01/05/2013 0939   CO2 30 10/19/2019 1033   CO2 27 01/05/2013 0939   GLUCOSE 100 (H) 10/19/2019 1033   GLUCOSE 75 01/05/2013 0939   BUN 7 10/19/2019 1033   BUN 11.7 01/05/2013 0939   CREATININE 0.75 10/19/2019 1033   CREATININE 0.8 01/05/2013 0939   CALCIUM 9.0 10/19/2019 1033   CALCIUM 9.4 01/05/2013 0939   PROT 6.8 10/19/2019 1033   PROT 7.5 01/05/2013 0939   ALBUMIN  4.0 10/19/2019 1033   ALBUMIN 3.8 01/05/2013 0939   AST 22 10/19/2019 1033   AST 12 01/05/2013 0939   ALT 14 10/19/2019 1033   ALT 13 01/05/2013 0939   ALKPHOS 79 10/19/2019 1033   ALKPHOS 103 01/05/2013 0939   BILITOT 0.5 10/19/2019 1033   BILITOT 0.52 01/05/2013 0939   GFRNONAA >60 08/04/2016 1146   GFRAA >60 08/04/2016 1146       Component Value Date/Time   WBC 6.8 10/19/2019 1033   RBC 4.36 10/19/2019 1033   HGB 12.5 10/19/2019 1033   HGB 13.1 09/05/2013 1552   HCT 38.9 10/19/2019 1033   HCT 40.1 09/05/2013 1552   PLT 260.0 10/19/2019 1033   PLT 257 09/05/2013 1552   MCV 89.2 10/19/2019 1033   MCV 88.7 09/05/2013 1552   MCH 22.9 (L) 08/04/2016 1146   MCHC 32.3 10/19/2019 1033   RDW 15.5 10/19/2019 1033   RDW 13.9 09/05/2013 1552   LYMPHSABS 1.4 10/19/2019 1033   LYMPHSABS 2.7 09/05/2013 1552   MONOABS 0.6 10/19/2019 1033   MONOABS 0.6 09/05/2013 1552   EOSABS 0.4 10/19/2019 1033   EOSABS 0.4 09/05/2013 1552   BASOSABS 0.0 10/19/2019 1033   BASOSABS 0.0 09/05/2013 1552    No results found for: POCLITH, LITHIUM   No results found for: PHENYTOIN, PHENOBARB, VALPROATE, CBMZ   .res Assessment: Plan:    Plan:  PDMP reviewed  Vyvanse 40 mg every morning  Paxil 30 mg every evening at bedtime Valium 10 mg twice daily as needed for anxiety or insomnia  Trazadone 50mg  at bedtime  Read and reviewed note with patient for accuracy.   RTC 4 weeks  Patient advised to contact office with any questions, adverse effects, or acute worsening in signs and symptoms.  Discussed potential benefits, risk, and side effects of benzodiazepines to include potential risk of tolerance and dependence, as well as possible drowsiness.  Advised patient not to drive if experiencing drowsiness and  to take lowest possible effective dose to minimize risk of dependence and tolerance.  Discussed potential benefits, risks, and side effects of stimulants with patient to include increased  heart rate, palpitations, insomnia, increased anxiety, increased irritability, or decreased appetite.  Instructed patient to contact office if experiencing any significant tolerability issues.  Diagnoses and all orders for this visit:  PCOS (polycystic ovarian syndrome)  Binge eating disorder -     lisdexamfetamine (VYVANSE) 40 MG capsule; Take 1 capsule (40 mg total) by mouth daily after breakfast. -     lisdexamfetamine (VYVANSE) 40 MG capsule; Take 1 capsule (40 mg total) by mouth daily after breakfast. -     lisdexamfetamine (VYVANSE) 40 MG capsule; Take 1 capsule (40 mg total) by mouth daily after breakfast.  Major depressive disorder, recurrent episode, in full remission ([place]) -     lisdexamfetamine (VYVANSE) 40 MG capsule; Take 1 capsule (40 mg total) by mouth daily after breakfast. -     diazepam (VALIUM) 10 MG tablet; Take 1 tablet (10 mg total) by mouth 2 (two) times daily as needed for anxiety. -     PARoxetine (PAXIL) 30 MG tablet; Take 1 tablet (30 mg total) by mouth daily at 10 pm. -     traZODone (DESYREL) 50 MG tablet; Take 1 tablet (50 mg total) by mouth at bedtime.  Generalized anxiety disorder -     diazepam (VALIUM) 10 MG tablet; Take 1 tablet (10 mg total) by mouth 2 (two) times daily as needed for anxiety. -     PARoxetine (PAXIL) 30 MG tablet; Take 1 tablet (30 mg total) by mouth daily at 10 pm. -     traZODone (DESYREL) 50 MG tablet; Take 1 tablet (50 mg total) by mouth at bedtime.  Insomnia, unspecified type     Please see After Visit Summary for patient specific instructions.  No future appointments.  No orders of the defined types were placed in this encounter.   -------------------------------

## 2020-12-11 DIAGNOSIS — M6281 Muscle weakness (generalized): Secondary | ICD-10-CM | POA: Diagnosis not present

## 2020-12-11 DIAGNOSIS — M5459 Other low back pain: Secondary | ICD-10-CM | POA: Diagnosis not present

## 2020-12-11 DIAGNOSIS — M2569 Stiffness of other specified joint, not elsewhere classified: Secondary | ICD-10-CM | POA: Diagnosis not present

## 2020-12-11 DIAGNOSIS — R262 Difficulty in walking, not elsewhere classified: Secondary | ICD-10-CM | POA: Diagnosis not present

## 2020-12-13 ENCOUNTER — Other Ambulatory Visit (HOSPITAL_COMMUNITY): Payer: Self-pay | Admitting: Physical Medicine & Rehabilitation

## 2020-12-13 DIAGNOSIS — M79604 Pain in right leg: Secondary | ICD-10-CM

## 2020-12-13 DIAGNOSIS — M5416 Radiculopathy, lumbar region: Secondary | ICD-10-CM | POA: Diagnosis not present

## 2020-12-13 DIAGNOSIS — M7989 Other specified soft tissue disorders: Secondary | ICD-10-CM

## 2020-12-14 ENCOUNTER — Telehealth: Payer: Self-pay | Admitting: Family Medicine

## 2020-12-14 ENCOUNTER — Ambulatory Visit (HOSPITAL_COMMUNITY)
Admission: RE | Admit: 2020-12-14 | Discharge: 2020-12-14 | Disposition: A | Discharge status: Home / Self Care | Payer: BC Managed Care – PPO | Source: Ambulatory Visit | Attending: Physical Medicine & Rehabilitation | Admitting: Physical Medicine & Rehabilitation

## 2020-12-14 ENCOUNTER — Other Ambulatory Visit: Payer: Self-pay

## 2020-12-14 DIAGNOSIS — M7989 Other specified soft tissue disorders: Secondary | ICD-10-CM | POA: Insufficient documentation

## 2020-12-14 DIAGNOSIS — M79604 Pain in right leg: Secondary | ICD-10-CM

## 2020-12-14 DIAGNOSIS — I82409 Acute embolism and thrombosis of unspecified deep veins of unspecified lower extremity: Secondary | ICD-10-CM | POA: Insufficient documentation

## 2020-12-14 MED ORDER — APIXABAN (ELIQUIS) VTE STARTER PACK (10MG AND 5MG): ORAL_TABLET | ORAL | 0 refills | Status: DC

## 2020-12-14 NOTE — Telephone Encounter (Signed)
Meredith Castillo has called from Dr.Willoughby's office to confirm the patient did come in and that it is showing  positive DVT on the right leg.  Further action: will a prescription be called in for the patient or should the patient come back in office?

## 2020-12-14 NOTE — Telephone Encounter (Signed)
Rx eliquis starter pack sent to pharmacy. Blood thinner precautions. Don't take with ibuprofen.  Please schedule OV next week.  ER precautions if PE symptoms or worsening over weekend.

## 2020-12-14 NOTE — Telephone Encounter (Signed)
Spoke with Marcene Brawn at Dr. Lenis Noon office who stated that patient came in to his office yesterday complaining of a knot on her right leg along with swelling and pain. Patient was sent for a doppler ultrasound today, which was positive for DVT. Butch Penny, CMA stated that she is not under the care of Dr. Lorelei Pont. Spoke with Leticia Penna, RN who suggested to speak with Dr. Danise Mina about this patient. Dr. Danise Mina made aware of the situation (please refer to note below). Spoke with patient who stated that these symptoms started Saturday. Patient denied SOB, chest pain, or worsening symptoms. Instructed patient that Dr. Danise Mina was starting her on Eliquis and that he would like to see her in office next week. Appointment made for Wednesday (3/23) and ED precautions given. Patient verbalized understanding. Patient stated that she has always been under the impression that she has been under the care of Dr. Lorelei Pont. Will send to Dr. Danise Mina as well as Dr. Lorelei Pont.

## 2020-12-14 NOTE — CV Procedure (Signed)
RLE venous duplex completed. Preliminary findings given to Dr. Thedore Mins at 1100.  Stated patient was ok to leave and that he would be in contact.  Results can be found under chart review under CV PROC. 12/14/2020 11:11 AM Carver Murakami RVT, RDMS

## 2020-12-15 NOTE — Telephone Encounter (Signed)
I think there may have been some confusion.  On chart review, and my memory, she has been my patient.  If she wanted to switch MD's for PCP, then that is ok.  If not, then I am happy to follow-up with her myself next week - ideally Thursday if possible.

## 2020-12-17 ENCOUNTER — Telehealth: Payer: Self-pay | Admitting: *Deleted

## 2020-12-17 ENCOUNTER — Encounter: Payer: Self-pay | Admitting: *Deleted

## 2020-12-17 NOTE — Telephone Encounter (Signed)
As long as she has enough to get to Thursday, then I can change then.

## 2020-12-17 NOTE — Telephone Encounter (Signed)
Received [facility] from [facility] requesting [facility] for Eliquis PE Starter Pack 5 mg.  When I spoke with patient earlier today she states the pharmacist was able to give her a coupon so she was able to get prescription on Friday to get started.  When attempting PA it states Xarelto is the preferred formulary alternative.  Okay to change to Xarelto or do PA.  Patient is schedule to see you on Thursday at 8:00 am.

## 2020-12-17 NOTE — Telephone Encounter (Signed)
OK, it should be no issue to change to Xarelto.

## 2020-12-17 NOTE — Telephone Encounter (Signed)
Yes she was able to get a 28 day starter pack.

## 2020-12-17 NOTE — Telephone Encounter (Signed)
I can attempt PA but I already sent in that we could change to preferred formulary Xarelto.

## 2020-12-17 NOTE — Telephone Encounter (Signed)
Can we do the eliquis PA, please

## 2020-12-17 NOTE — Telephone Encounter (Signed)
Spoke with Meredith Castillo and rescheduled her appointment to Dr. Lorelei Pont on Thursday 10/22/2020 at 8:00 am.

## 2020-12-19 ENCOUNTER — Ambulatory Visit: Payer: BC Managed Care – PPO | Admitting: Family Medicine

## 2020-12-20 ENCOUNTER — Ambulatory Visit (INDEPENDENT_AMBULATORY_CARE_PROVIDER_SITE_OTHER): Payer: BC Managed Care – PPO | Admitting: Family Medicine

## 2020-12-20 ENCOUNTER — Other Ambulatory Visit: Payer: Self-pay

## 2020-12-20 ENCOUNTER — Telehealth: Payer: Self-pay

## 2020-12-20 ENCOUNTER — Encounter: Payer: Self-pay | Admitting: Family Medicine

## 2020-12-20 VITALS — BP 130/80 | HR 94 | Temp 98.4°F | Ht 67.0 in | Wt 297.5 lb

## 2020-12-20 DIAGNOSIS — I82811 Embolism and thrombosis of superficial veins of right lower extremities: Secondary | ICD-10-CM | POA: Diagnosis not present

## 2020-12-20 DIAGNOSIS — I82431 Acute embolism and thrombosis of right popliteal vein: Secondary | ICD-10-CM

## 2020-12-20 MED ORDER — RIVAROXABAN 20 MG PO TABS: 20.0000 mg | ORAL_TABLET | Freq: Every day | ORAL | 2 refills | Status: DC

## 2020-12-20 MED ORDER — RIVAROXABAN (XARELTO) VTE STARTER PACK (15 & 20 MG): ORAL_TABLET | ORAL | 0 refills | Status: DC

## 2020-12-20 NOTE — Progress Notes (Signed)
Meredith T. Copland, MD, [place] at [facility] [place], [address]  Phone: [phone]  FAX: [phone]  Meredith Castillo - 40 y.o. female  MRN [id number]  Date of Birth: 11-29-1980  Date: 12/20/2020  PCP: Owens Loffler, MD  Referral: Owens Loffler, MD  Chief Complaint  Patient presents with  . Follow-up    DVT  . Fall    Sunday night    This visit occurred during the SARS-CoV-2 public health emergency.  Safety protocols were in place, including screening questions prior to the visit, additional usage of staff PPE, and extensive cleaning of exam room while observing appropriate contact time as indicated for disinfecting solutions.   Subjective:   Meredith Castillo is a 40 y.o. very pleasant female patient with Body mass index is 46.6 kg/m. who presents with the following:  DVT R posterior tib veins.  Reviewed U/S with her. She denies any chest pain or significant shortness of breath.  Xarelto change from Eliquis.  While this was not approved, the patient's pharmacist was able to help her get the starter pack with a coupon.  [place] also Sunday night.  Leaned on the kitchen counter, but she did not lose consciouness.  Bruised her knees up some.  She also does have some anterior chest wall pain.  Had some knee pain. Has a disc herniation. PT and then [facility] -she wonders about if she can start PT again.    Review of Systems is noted in the HPI, as appropriate  Objective:   BP 130/80   Pulse 94   Temp 98.4 F (36.9 C) (Temporal)   Ht 5\' 7"  (1.702 m)   Wt 297 lb 8 oz (134.9 kg)   SpO2 98%   BMI 46.60 kg/m   GEN: No acute distress; alert,appropriate. PULM: Breathing comfortably in no respiratory distress PSYCH: Normally interactive.  She does have obvious varicose veins on the left side. She does have some topical bruising and sensation that is somewhat painful at the  patellas bilaterally.  No significant pain on the proximal tibia or fibula is.  Otherwise grossly nontender knee exam.  She does have some mild left-sided chest wall pain.  No significant comparable swelling on the right compared to the left, or at least mild only.  Laboratory and Imaging Data: VAS [place] LOWER EXTREMITY VENOUS (DVT)  Result Date: 12/16/2020  Lower Venous DVT Study Indications: RLE calf pain and localized swelling.  Comparison Study: Previous exam 08/07/16 Performing Technologist: Rogelia Rohrer  Examination Guidelines: A complete evaluation includes B-mode imaging, spectral Doppler, color Doppler, and power Doppler as needed of all accessible portions of each vessel. Bilateral testing is considered an integral part of a complete examination. Limited examinations for reoccurring indications may be performed as noted. The reflux portion of the exam is performed with the patient in reverse Trendelenburg.  +------------+---------------+---------+-----------+----------+----------------+ RIGHT       CompressibilityPhasicitySpontaneityPropertiesThrombus Aging   +------------+---------------+---------+-----------+----------+----------------+ CFV         Full           Yes      Yes                                   +------------+---------------+---------+-----------+----------+----------------+ SFJ         Full                                                          +------------+---------------+---------+-----------+----------+----------------+  FV Prox     Full           Yes      Yes                                   +------------+---------------+---------+-----------+----------+----------------+ FV Mid      Full           Yes      Yes                                   +------------+---------------+---------+-----------+----------+----------------+ FV Distal   Full           Yes      Yes                                    +------------+---------------+---------+-----------+----------+----------------+ PFV         Full                                                          +------------+---------------+---------+-----------+----------+----------------+ POP         Full           Yes      Yes                                   +------------+---------------+---------+-----------+----------+----------------+ PTV         None           No       No                   Acute            +------------+---------------+---------+-----------+----------+----------------+ PERO        Full                                                          +------------+---------------+---------+-----------+----------+----------------+ Varicose    None           No       No                   Acute - medial   Vein                                                     calf             +------------+---------------+---------+-----------+----------+----------------+ Perforator  Full           No       No                   Acute - medial  calf             +------------+---------------+---------+-----------+----------+----------------+   +----+---------------+---------+-----------+----------+--------------+ LEFTCompressibilityPhasicitySpontaneityPropertiesThrombus Aging +----+---------------+---------+-----------+----------+--------------+ CFV Full           Yes      Yes                                 +----+---------------+---------+-----------+----------+--------------+     Summary: RIGHT: - Findings consistent with acute deep vein thrombosis involving the right posterior tibial veins. - Findings consistent with acute superficial vein thrombosis involving the right varicosities or other superficial veins. - No cystic structure found in the popliteal fossa.  LEFT: - No evidence of common femoral vein obstruction.  *See table(s) above for  measurements and observations. Electronically signed by Ruta Hinds MD on 12/16/2020 at 11:16:25 AM.    Final      Assessment and Plan:     ICD-10-CM   1. Acute deep vein thrombosis (DVT) of popliteal vein of right lower extremity (HCC)  I82.431   2. Superficial thrombosis of right lower extremity  I82.811    Total encounter time: 30 minutes. This includes total time spent on the day of encounter.  I did my best to explain the anatomy and treatment plan with the patient.  I answered all questions.  Also reviewed the chart in its entirety.  Reviewed ultrasound with the patient.  Change from Eliquis to Xarelto, this is primarily from insurance and it will not alter plan of care with treatment.  3 months of anticoagulation.  Reviewed if she does have some sudden acute onset of chest pain and notably worsened shortness of breath that she needs to be evaluated emergently.  She could electively have her varicose veins evaluated by vascular surgery if she so chose.  Meds ordered this encounter  Medications  . RIVAROXABAN (XARELTO) VTE STARTER PACK (15 & 20 MG)    Sig: Follow package directions: Take one 15mg  tablet by mouth twice a day. On day 22, switch to one 20mg  tablet once a day. Take with food.    Dispense:  51 each    Refill:  0  . rivaroxaban (XARELTO) 20 MG TABS tablet    Sig: Take 1 tablet (20 mg total) by mouth daily with supper.    Dispense:  30 tablet    Refill:  2    For refills   Medications Discontinued During This Encounter  Medication Reason  . fluconazole (DIFLUCAN) 150 MG tablet Completed Course  . furosemide (LASIX) 20 MG tablet Completed Course  . triamcinolone lotion (KENALOG) 0.1 % Completed Course   No orders of the defined types were placed in this encounter.   Follow-up: As needed only  Signed,  Meredith T. Copland, MD   Outpatient Encounter Medications as of 12/20/2020  Medication Sig  . albuterol (PROVENTIL HFA;VENTOLIN HFA) 108 (90 BASE) MCG/ACT  inhaler Inhale 2 puffs into the lungs every 4 (four) hours as needed for wheezing or shortness of breath.  . APIXABAN (ELIQUIS) VTE STARTER PACK (10MG  AND 5MG ) Take as directed on package: start with two-5mg  tablets twice daily for 7 days. On day 8, switch to one-5mg  tablet twice daily.  . cetirizine (ZYRTEC) 10 MG tablet Take 10 mg by mouth daily.  . diazepam (VALIUM) 10 MG tablet Take 1 tablet (10 mg total) by mouth 2 (two) times daily as needed for anxiety.  . fluticasone (FLONASE) 50 MCG/ACT nasal spray Place 1 spray into  both nostrils daily as needed for allergies.  . folic acid (FOLVITE) 1 MG tablet Take 1 mg by mouth daily.  Marland Kitchen gabapentin (NEURONTIN) 300 MG capsule TAKE 1 CAPSULE BY MOUTH THREE TIMES A DAY AS DIRECTED (INCREASE IN FREQUENCY)  . HYDROcodone-acetaminophen (NORCO) 10-325 MG tablet TAKE 1 TABLET BY MOUTH EVERY 4 HOURS WHILE AWAKE AS NEEDED FOR PAIN  . ibuprofen (ADVIL,MOTRIN) 200 MG tablet Take 400-800 mg by mouth every 8 (eight) hours as needed for headache or moderate pain.   Marland Kitchen levonorgestrel (MIRENA) 20 MCG/24HR IUD 1 each by Intrauterine route once.  . lisdexamfetamine (VYVANSE) 40 MG capsule Take 1 capsule (40 mg total) by mouth daily after breakfast.  . [START ON 01/02/2021] lisdexamfetamine (VYVANSE) 40 MG capsule Take 1 capsule (40 mg total) by mouth daily after breakfast.  . [START ON 01/30/2021] lisdexamfetamine (VYVANSE) 40 MG capsule Take 1 capsule (40 mg total) by mouth daily after breakfast.  . Multiple Vitamins-Minerals (MULTIVITAMIN WITH MINERALS) tablet Take 1 tablet by mouth daily.  . mupirocin ointment (BACTROBAN) 2 % Place 1 application into the nose as needed (for wound care).  . nystatin ointment (MYCOSTATIN) Apply 1 application topically 2 (two) times daily.  Marland Kitchen PARoxetine (PAXIL) 30 MG tablet Take 1 tablet (30 mg total) by mouth daily at 10 pm.  . promethazine (PHENERGAN) 25 MG tablet TAKE 1 TABLET BY MOUTH EVERY 6 HOURS AS NEEDED FOR NAUSEA AND VOMITING  .  rivaroxaban (XARELTO) 20 MG TABS tablet Take 1 tablet (20 mg total) by mouth daily with supper.  Marland Kitchen RIVAROXABAN (XARELTO) VTE STARTER PACK (15 & 20 MG) Follow package directions: Take one 15mg  tablet by mouth twice a day. On day 22, switch to one 20mg  tablet once a day. Take with food.  Marland Kitchen tiZANidine (ZANAFLEX) 4 MG tablet TAKE 1 TABLET BY MOUTH THREE TIMES A DAY AS NEEDED FOR PAIN FOR BACK MUSCLE SPASM, WILL MAKE DROWSY  . traZODone (DESYREL) 50 MG tablet Take 1 tablet (50 mg total) by mouth at bedtime.  . valACYclovir (VALTREX) 1000 MG tablet Take 2 tablets (2,000 mg total) by mouth 2 (two) times daily.  . [DISCONTINUED] fluconazole (DIFLUCAN) 150 MG tablet 1 tab po every other day (Patient not taking: Reported on 01/06/2020)  . [DISCONTINUED] furosemide (LASIX) 20 MG tablet Take 1 tablet (20 mg total) by mouth daily.  . [DISCONTINUED] triamcinolone lotion (KENALOG) 0.1 % Apply 1 application topically 2 (two) times daily. For no more than 10 days.   No facility-administered encounter medications on file as of 12/20/2020.

## 2020-12-20 NOTE — Patient Instructions (Signed)
Stop your Eliquis after tonight's dose and start Xarelto starter pack tomorrow.

## 2020-12-20 NOTE — Telephone Encounter (Signed)
Yes, this is completely fine.

## 2020-12-20 NOTE — Telephone Encounter (Signed)
[place] notified as instructed by telephone.  Patient states understanding.

## 2020-12-20 NOTE — Telephone Encounter (Signed)
Pt left v/m that she had appt earlier and was to take eliquis this evening and start Xarelto starter pak on 12/21/20. Pt said pharmacy cannot get Xarelto starter pak in until 12/21/20 in the evening. Pt wants to know if should take eliquis on 12/20/20 and 12/21/20 in evening and start xarelto starter pak on 12/22/20. Pt want s to confirm with Dr Lorelei Pont if that is OK and if not how should pt take meds.

## 2020-12-27 DIAGNOSIS — M5416 Radiculopathy, lumbar region: Secondary | ICD-10-CM | POA: Diagnosis not present

## 2021-01-01 DIAGNOSIS — M5416 Radiculopathy, lumbar region: Secondary | ICD-10-CM | POA: Diagnosis not present

## 2021-01-04 ENCOUNTER — Other Ambulatory Visit: Payer: Self-pay | Admitting: Family Medicine

## 2021-01-04 NOTE — Telephone Encounter (Signed)
Spoke with Meredith Castillo to confirm if she was taking Eliquis or Xarelto.  Per patient she in on Xarelto.  Refill for Eliquis denied.

## 2021-01-09 DIAGNOSIS — M5416 Radiculopathy, lumbar region: Secondary | ICD-10-CM | POA: Diagnosis not present

## 2021-01-23 DIAGNOSIS — M5416 Radiculopathy, lumbar region: Secondary | ICD-10-CM | POA: Diagnosis not present

## 2021-01-29 DIAGNOSIS — M5416 Radiculopathy, lumbar region: Secondary | ICD-10-CM | POA: Diagnosis not present

## 2021-01-30 ENCOUNTER — Other Ambulatory Visit: Payer: Self-pay | Admitting: Family Medicine

## 2021-01-30 NOTE — Telephone Encounter (Signed)
Last office visit 12/20/2020 for DVT.  Last refilled 08/30/2019 for #30 with 2 refills.  No future appointments.

## 2021-02-13 DIAGNOSIS — M5416 Radiculopathy, lumbar region: Secondary | ICD-10-CM | POA: Diagnosis not present

## 2021-02-19 DIAGNOSIS — M5416 Radiculopathy, lumbar region: Secondary | ICD-10-CM | POA: Diagnosis not present

## 2021-03-07 DIAGNOSIS — M5416 Radiculopathy, lumbar region: Secondary | ICD-10-CM | POA: Diagnosis not present

## 2021-03-19 DIAGNOSIS — M5459 Other low back pain: Secondary | ICD-10-CM | POA: Diagnosis not present

## 2021-03-19 DIAGNOSIS — M5136 Other intervertebral disc degeneration, lumbar region: Secondary | ICD-10-CM | POA: Diagnosis not present

## 2021-03-19 DIAGNOSIS — M5416 Radiculopathy, lumbar region: Secondary | ICD-10-CM | POA: Diagnosis not present

## 2021-03-19 DIAGNOSIS — M6281 Muscle weakness (generalized): Secondary | ICD-10-CM | POA: Diagnosis not present

## 2021-03-25 DIAGNOSIS — M6281 Muscle weakness (generalized): Secondary | ICD-10-CM | POA: Diagnosis not present

## 2021-03-25 DIAGNOSIS — M5136 Other intervertebral disc degeneration, lumbar region: Secondary | ICD-10-CM | POA: Diagnosis not present

## 2021-03-25 DIAGNOSIS — M5416 Radiculopathy, lumbar region: Secondary | ICD-10-CM | POA: Diagnosis not present

## 2021-03-25 DIAGNOSIS — M5459 Other low back pain: Secondary | ICD-10-CM | POA: Diagnosis not present

## 2021-04-03 DIAGNOSIS — M5136 Other intervertebral disc degeneration, lumbar region: Secondary | ICD-10-CM | POA: Diagnosis not present

## 2021-04-11 DIAGNOSIS — M47816 Spondylosis without myelopathy or radiculopathy, lumbar region: Secondary | ICD-10-CM | POA: Diagnosis not present

## 2021-04-11 DIAGNOSIS — M5416 Radiculopathy, lumbar region: Secondary | ICD-10-CM | POA: Diagnosis not present

## 2021-04-11 DIAGNOSIS — M461 Sacroiliitis, not elsewhere classified: Secondary | ICD-10-CM | POA: Diagnosis not present

## 2021-05-03 ENCOUNTER — Other Ambulatory Visit: Payer: Self-pay | Admitting: Adult Health

## 2021-05-03 DIAGNOSIS — F411 Generalized anxiety disorder: Secondary | ICD-10-CM

## 2021-05-03 DIAGNOSIS — F3342 Major depressive disorder, recurrent, in full remission: Secondary | ICD-10-CM

## 2021-05-06 DIAGNOSIS — M6289 Other specified disorders of muscle: Secondary | ICD-10-CM | POA: Diagnosis not present

## 2021-05-06 NOTE — Telephone Encounter (Signed)
Next apt 9/09

## 2021-05-14 DIAGNOSIS — M5416 Radiculopathy, lumbar region: Secondary | ICD-10-CM | POA: Diagnosis not present

## 2021-05-14 DIAGNOSIS — M461 Sacroiliitis, not elsewhere classified: Secondary | ICD-10-CM | POA: Diagnosis not present

## 2021-05-14 DIAGNOSIS — M7918 Myalgia, other site: Secondary | ICD-10-CM | POA: Diagnosis not present

## 2021-05-14 DIAGNOSIS — R202 Paresthesia of skin: Secondary | ICD-10-CM | POA: Diagnosis not present

## 2021-05-15 DIAGNOSIS — M5416 Radiculopathy, lumbar region: Secondary | ICD-10-CM | POA: Diagnosis not present

## 2021-05-22 ENCOUNTER — Other Ambulatory Visit: Payer: Self-pay

## 2021-05-22 ENCOUNTER — Telehealth: Payer: Self-pay | Admitting: Adult Health

## 2021-05-22 DIAGNOSIS — F5081 Binge eating disorder: Secondary | ICD-10-CM

## 2021-05-22 MED ORDER — LISDEXAMFETAMINE DIMESYLATE 40 MG PO CAPS: 40.0000 mg | ORAL_CAPSULE | Freq: Every day | ORAL | 0 refills | Status: DC

## 2021-05-22 NOTE — Telephone Encounter (Signed)
Pended.

## 2021-05-22 NOTE — Telephone Encounter (Signed)
Meredith Castillo called to request refill of her Vyvanse.  Appt 06/07/21.  [facility] [address] [facility] [place], [place]

## 2021-05-27 DIAGNOSIS — R600 Localized edema: Secondary | ICD-10-CM | POA: Diagnosis not present

## 2021-05-27 DIAGNOSIS — M67951 Unspecified disorder of synovium and tendon, right thigh: Secondary | ICD-10-CM | POA: Diagnosis not present

## 2021-05-27 DIAGNOSIS — M67952 Unspecified disorder of synovium and tendon, left thigh: Secondary | ICD-10-CM | POA: Diagnosis not present

## 2021-06-07 ENCOUNTER — Other Ambulatory Visit: Payer: Self-pay

## 2021-06-07 ENCOUNTER — Encounter: Payer: Self-pay | Admitting: Adult Health

## 2021-06-07 ENCOUNTER — Ambulatory Visit (INDEPENDENT_AMBULATORY_CARE_PROVIDER_SITE_OTHER): Payer: BC Managed Care – PPO | Admitting: Adult Health

## 2021-06-07 DIAGNOSIS — F411 Generalized anxiety disorder: Secondary | ICD-10-CM

## 2021-06-07 DIAGNOSIS — E282 Polycystic ovarian syndrome: Secondary | ICD-10-CM | POA: Diagnosis not present

## 2021-06-07 DIAGNOSIS — F5081 Binge eating disorder: Secondary | ICD-10-CM

## 2021-06-07 DIAGNOSIS — F3342 Major depressive disorder, recurrent, in full remission: Secondary | ICD-10-CM | POA: Diagnosis not present

## 2021-06-07 MED ORDER — LISDEXAMFETAMINE DIMESYLATE 40 MG PO CAPS: 40.0000 mg | ORAL_CAPSULE | Freq: Every day | ORAL | 0 refills | Status: DC

## 2021-06-07 MED ORDER — LISDEXAMFETAMINE DIMESYLATE 40 MG PO CAPS
40.0000 mg | ORAL_CAPSULE | Freq: Every day | ORAL | 0 refills | Status: DC
Start: 2021-06-07 — End: 2021-09-25

## 2021-06-07 NOTE — Progress Notes (Signed)
IKISHA EPPEL [id number] [birth year] 40 y.o.  Subjective:   Patient ID:  Meredith Castillo is a 40 y.o. (DOB 1980-11-04) female.  Chief Complaint: No chief complaint on file.   HPI Meredith Castillo presents to the office today for follow-up of PCOS, MDD, GAD, Binge eating disorder.  Describes mood today as "ok". Pleasant. Mood symptoms - reports anxiety and irritability at times. Decreased depression with Paxil. Stating "I'm doing ok". Feels like medications are working well for her. Stable interest and motivation. Taking medications as prescribed.  Energy levels stable "better in the mornings". Active, does not have a regular exercise routine.  Enjoys some usual interests and activities. Widowed. Lives alone with 2 daughters 67 and 59. Husband's family local and supportive. Appetite adequate. Weight loss - 280 pounds. Sleeps well most nights. Averages 6.5 to 7 hours with Trazadone. Focus and concentration stable. Completing tasks. Managing aspects of household. 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 Denies SI or HI.  Denies AH or VH.  Previous medication trials: Unknown      Review of Systems:  Review of Systems  Musculoskeletal:  Negative for gait problem.  Neurological:  Negative for tremors.  Psychiatric/Behavioral:         Please refer to HPI   Medications: I have reviewed the patient's current medications.  Current Outpatient Medications  Medication Sig Dispense Refill   promethazine (PHENERGAN) 25 MG tablet TAKE 1 TABLET BY MOUTH EVERY 6 HOURS AS NEEDED FOR NAUSEA AND VOMITING 30 tablet 2   albuterol (PROVENTIL HFA;VENTOLIN HFA) 108 (90 BASE) MCG/ACT inhaler Inhale 2 puffs into the lungs every 4 (four) hours as needed for wheezing or shortness of breath. 1 Inhaler 3   cetirizine (ZYRTEC) 10 MG tablet Take 10 mg by mouth daily.     diazepam (VALIUM) 10 MG tablet TAKE 1 TABLET TWICE A DAY  AS NEEDED FOR ANXIETY 180 tablet 0   fluticasone (FLONASE) 50 MCG/ACT nasal spray  Place 1 spray into both nostrils daily as needed for allergies.     folic acid (FOLVITE) 1 MG tablet Take 1 mg by mouth daily.     gabapentin (NEURONTIN) 300 MG capsule TAKE 1 CAPSULE BY MOUTH THREE TIMES A DAY AS DIRECTED (INCREASE IN FREQUENCY)     HYDROcodone-acetaminophen (NORCO) 10-325 MG tablet TAKE 1 TABLET BY MOUTH EVERY 4 HOURS WHILE AWAKE AS NEEDED FOR PAIN     ibuprofen (ADVIL,MOTRIN) 200 MG tablet Take 400-800 mg by mouth every 8 (eight) hours as needed for headache or moderate pain.      levonorgestrel (MIRENA) 20 MCG/24HR IUD 1 each by Intrauterine route once.     lisdexamfetamine (VYVANSE) 40 MG capsule Take 1 capsule (40 mg total) by mouth daily after breakfast. 30 capsule 0   [START ON 07/05/2021] lisdexamfetamine (VYVANSE) 40 MG capsule Take 1 capsule (40 mg total) by mouth daily after breakfast. 30 capsule 0   [START ON 08/02/2021] lisdexamfetamine (VYVANSE) 40 MG capsule Take 1 capsule (40 mg total) by mouth daily after breakfast. 30 capsule 0   Multiple Vitamins-Minerals (MULTIVITAMIN WITH MINERALS) tablet Take 1 tablet by mouth daily.     mupirocin ointment (BACTROBAN) 2 % Place 1 application into the nose as needed (for wound care). 22 g 1   nystatin ointment (MYCOSTATIN) Apply 1 application topically 2 (two) times daily. 30 g 0   PARoxetine (PAXIL) 30 MG tablet Take 1 tablet (30 mg total) by mouth daily at 10 pm. 90 tablet 3   rivaroxaban (  XARELTO) 20 MG TABS tablet Take 1 tablet (20 mg total) by mouth daily with supper. 30 tablet 2   RIVAROXABAN (XARELTO) VTE STARTER PACK (15 & 20 MG) Follow package directions: Take one '15mg'$  tablet by mouth twice a day. On day 22, switch to one '20mg'$  tablet once a day. Take with food. 51 each 0   tiZANidine (ZANAFLEX) 4 MG tablet TAKE 1 TABLET BY MOUTH THREE TIMES A DAY AS NEEDED FOR PAIN FOR BACK MUSCLE SPASM, WILL MAKE DROWSY     traZODone (DESYREL) 50 MG tablet Take 1 tablet (50 mg total) by mouth at bedtime. 90 tablet 3   valACYclovir  (VALTREX) 1000 MG tablet Take 2 tablets (2,000 mg total) by mouth 2 (two) times daily. 4 tablet 1   No current facility-administered medications for this visit.    Medication Side Effects: None  Allergies:  Allergies  Allergen Reactions   Vancomycin Other (See Comments)    Patient states that when this medication is given intravenously, it causes her kidney's to shut down.   Neosporin [Neomycin-Polymyxin-Gramicidin] Other (See Comments)    Patient states that this medication causes a poison ivy rash like reaction.    Past Medical History:  Diagnosis Date   Asthma    Bariatric surgery status complicating pregnancy, childbirth, or the puerperium, antepartum condition or complication [id number]   HTN (hypertension)    Iron deficiency anemia    due to gastric bypass.    Mild asthma 01/10/2014   Morbid obesity with BMI of 40.0-44.9, adult ([place])    PCOS (polycystic ovarian syndrome)    PP care - s/p SVD 2/19 11/19/2011   Unspecified vitamin D deficiency    Vitamin B12 deficiency    due to gastric bypass.     Past Medical History, Surgical history, Social history, and Family history were reviewed and updated as appropriate.   Please see review of systems for further details on the patient's review from today.   Objective:   Physical Exam:  There were no vitals taken for this visit.  Physical Exam Constitutional:      General: She is not in acute distress. Musculoskeletal:        General: No deformity.  Neurological:     Mental Status: She is alert and oriented to person, place, and time.     Coordination: Coordination normal.  Psychiatric:        Attention and Perception: Attention and perception normal. She does not perceive auditory or visual hallucinations.        Mood and Affect: Mood normal. Mood is not anxious or depressed. Affect is not labile, blunt, angry or inappropriate.        Speech: Speech normal.        Behavior: Behavior normal.        Thought Content: Thought  content normal. Thought content is not paranoid or delusional. Thought content does not include homicidal or suicidal ideation. Thought content does not include homicidal or suicidal plan.        Cognition and Memory: Cognition and memory normal.        Judgment: Judgment normal.     Comments: Insight intact    Lab Review:     Component Value Date/Time   NA 136 10/19/2019 1033   NA 140 01/05/2013 0939   K 4.6 10/19/2019 1033   K 4.7 01/05/2013 0939   CL 102 10/19/2019 1033   CL 106 01/05/2013 0939   CO2 30 10/19/2019 1033   CO2 27  01/05/2013 0939   GLUCOSE 100 (H) 10/19/2019 1033   GLUCOSE 75 01/05/2013 0939   BUN 7 10/19/2019 1033   BUN 11.7 01/05/2013 0939   CREATININE 0.75 10/19/2019 1033   CREATININE 0.8 01/05/2013 0939   CALCIUM 9.0 10/19/2019 1033   CALCIUM 9.4 01/05/2013 0939   PROT 6.8 10/19/2019 1033   PROT 7.5 01/05/2013 0939   ALBUMIN 4.0 10/19/2019 1033   ALBUMIN 3.8 01/05/2013 0939   AST 22 10/19/2019 1033   AST 12 01/05/2013 0939   ALT 14 10/19/2019 1033   ALT 13 01/05/2013 0939   ALKPHOS 79 10/19/2019 1033   ALKPHOS 103 01/05/2013 0939   BILITOT 0.5 10/19/2019 1033   BILITOT 0.52 01/05/2013 0939   GFRNONAA >60 08/04/2016 1146   GFRAA >60 08/04/2016 1146       Component Value Date/Time   WBC 6.8 10/19/2019 1033   RBC 4.36 10/19/2019 1033   HGB 12.5 10/19/2019 1033   HGB 13.1 09/05/2013 1552   HCT 38.9 10/19/2019 1033   HCT 40.1 09/05/2013 1552   PLT 260.0 10/19/2019 1033   PLT 257 09/05/2013 1552   MCV 89.2 10/19/2019 1033   MCV 88.7 09/05/2013 1552   MCH 22.9 (L) 08/04/2016 1146   MCHC 32.3 10/19/2019 1033   RDW 15.5 10/19/2019 1033   RDW 13.9 09/05/2013 1552   LYMPHSABS 1.4 10/19/2019 1033   LYMPHSABS 2.7 09/05/2013 1552   MONOABS 0.6 10/19/2019 1033   MONOABS 0.6 09/05/2013 1552   EOSABS 0.4 10/19/2019 1033   EOSABS 0.4 09/05/2013 1552   BASOSABS 0.0 10/19/2019 1033   BASOSABS 0.0 09/05/2013 1552    No results found for: POCLITH,  LITHIUM   No results found for: PHENYTOIN, PHENOBARB, VALPROATE, CBMZ   .res Assessment: Plan:    Plan:  PDMP reviewed  Vyvanse 40 mg every morning  Paxil 30 mg every evening at bedtime Valium 10 mg twice daily as needed for anxiety or insomnia  Trazadone '50mg'$  at bedtime  BP 134/83 83  RTC 6 months  Patient advised to contact office with any questions, adverse effects, or acute worsening in signs and symptoms.  Discussed potential benefits, risk, and side effects of benzodiazepines to include potential risk of tolerance and dependence, as well as possible drowsiness.  Advised patient not to drive if experiencing drowsiness and to take lowest possible effective dose to minimize risk of dependence and tolerance.  Discussed potential benefits, risks, and side effects of stimulants with patient to include increased heart rate, palpitations, insomnia, increased anxiety, increased irritability, or decreased appetite.  Instructed patient to contact office if experiencing any significant tolerability issues.  Diagnoses and all orders for this visit:  Binge eating disorder -     lisdexamfetamine (VYVANSE) 40 MG capsule; Take 1 capsule (40 mg total) by mouth daily after breakfast. -     lisdexamfetamine (VYVANSE) 40 MG capsule; Take 1 capsule (40 mg total) by mouth daily after breakfast. -     lisdexamfetamine (VYVANSE) 40 MG capsule; Take 1 capsule (40 mg total) by mouth daily after breakfast.  Major depressive disorder, recurrent episode, in full remission ([place]) -     lisdexamfetamine (VYVANSE) 40 MG capsule; Take 1 capsule (40 mg total) by mouth daily after breakfast.    Please see After Visit Summary for patient specific instructions.  Future Appointments  Date Time Provider [place]  12/05/2021  4:20 PM Cheryln Balcom, Berdie Ogren, NP CP-CP None    No orders of the defined types were placed in this  encounter.   -------------------------------

## 2021-06-11 DIAGNOSIS — M461 Sacroiliitis, not elsewhere classified: Secondary | ICD-10-CM | POA: Diagnosis not present

## 2021-06-11 DIAGNOSIS — R202 Paresthesia of skin: Secondary | ICD-10-CM | POA: Diagnosis not present

## 2021-06-11 DIAGNOSIS — M7918 Myalgia, other site: Secondary | ICD-10-CM | POA: Diagnosis not present

## 2021-07-08 DIAGNOSIS — M461 Sacroiliitis, not elsewhere classified: Secondary | ICD-10-CM | POA: Diagnosis not present

## 2021-07-09 DIAGNOSIS — M461 Sacroiliitis, not elsewhere classified: Secondary | ICD-10-CM | POA: Diagnosis not present

## 2021-07-09 DIAGNOSIS — M7918 Myalgia, other site: Secondary | ICD-10-CM | POA: Diagnosis not present

## 2021-07-09 DIAGNOSIS — M7060 Trochanteric bursitis, unspecified hip: Secondary | ICD-10-CM | POA: Diagnosis not present

## 2021-07-31 DIAGNOSIS — M5416 Radiculopathy, lumbar region: Secondary | ICD-10-CM | POA: Diagnosis not present

## 2021-08-08 DIAGNOSIS — G8929 Other chronic pain: Secondary | ICD-10-CM | POA: Diagnosis not present

## 2021-08-08 DIAGNOSIS — R202 Paresthesia of skin: Secondary | ICD-10-CM | POA: Diagnosis not present

## 2021-08-08 DIAGNOSIS — M5441 Lumbago with sciatica, right side: Secondary | ICD-10-CM | POA: Diagnosis not present

## 2021-08-08 DIAGNOSIS — M461 Sacroiliitis, not elsewhere classified: Secondary | ICD-10-CM | POA: Diagnosis not present

## 2021-08-19 DIAGNOSIS — G8929 Other chronic pain: Secondary | ICD-10-CM | POA: Diagnosis not present

## 2021-08-19 DIAGNOSIS — M5441 Lumbago with sciatica, right side: Secondary | ICD-10-CM | POA: Diagnosis not present

## 2021-09-03 ENCOUNTER — Encounter: Payer: Self-pay | Admitting: Dermatology

## 2021-09-03 ENCOUNTER — Ambulatory Visit (INDEPENDENT_AMBULATORY_CARE_PROVIDER_SITE_OTHER): Payer: BC Managed Care – PPO | Admitting: Dermatology

## 2021-09-03 ENCOUNTER — Other Ambulatory Visit: Payer: Self-pay

## 2021-09-03 DIAGNOSIS — D1723 Benign lipomatous neoplasm of skin and subcutaneous tissue of right leg: Secondary | ICD-10-CM

## 2021-09-03 DIAGNOSIS — D485 Neoplasm of uncertain behavior of skin: Secondary | ICD-10-CM | POA: Diagnosis not present

## 2021-09-03 MED ORDER — MUPIROCIN 2 % EX OINT: 1.0000 "application " | TOPICAL_OINTMENT | Freq: Every day | CUTANEOUS | 0 refills | Status: DC

## 2021-09-03 NOTE — Patient Instructions (Signed)
Wound Care Instructions  Cleanse wound gently with soap and water once a day then pat dry with clean gauze. Apply a thing coat of Petrolatum (petroleum jelly, "Vaseline") over the wound (unless you have an allergy to this). We recommend that you use a new, sterile tube of Vaseline. Do not pick or remove scabs. Do not remove the yellow or white "healing tissue" from the base of the wound.  Cover the wound with fresh, clean, nonstick gauze and secure with paper tape. You may use Band-Aids in place of gauze and tape if the would is small enough, but would recommend trimming much of the tape off as there is often too much. Sometimes Band-Aids can irritate the skin.  You should call the office for your biopsy report after 1 week if you have not already been contacted.  If you experience any problems, such as abnormal amounts of bleeding, swelling, significant bruising, significant pain, or evidence of infection, please call the office immediately.  If You Need Anything After Your Visit  If you have any questions or concerns for your doctor, please call our main line at [phone] and press option 4 to reach your doctor's medical assistant. If no one answers, please leave a voicemail as directed and we will return your call as soon as possible. Messages left after 4 pm will be answered the following business day.   You may also send [place] a message via [place]. We typically respond to MyChart messages within 1-2 business days.  For prescription refills, please ask your pharmacy to contact our office. Our fax number is [phone].  If you have an urgent issue when the clinic is closed that cannot wait until the next business day, you can page your doctor at the number below.    Please note that while we do our best to be available for urgent issues outside of office hours, we are not available 24/7.   If you have an urgent issue and are unable to reach [place], you may choose to seek medical care at your  doctor's office, retail clinic, urgent care center, or emergency room.  If you have a medical emergency, please immediately call 911 or go to the emergency department.  Pager Numbers  - Dr. Nehemiah Massed: [phone]  - Dr. Laurence Ferrari: [phone]  - Dr. Nicole Kindred: [phone]  In the event of inclement weather, please call our main line at [phone] for an update on the status of any delays or closures.  Dermatology Medication Tips: Please keep the boxes that topical medications come in in order to help keep track of the instructions about where and how to use these. Pharmacies typically print the medication instructions only on the boxes and not directly on the medication tubes.   If your medication is too expensive, please contact our office at [phone] option 4 or send [place] a message through [place].   We are unable to tell what your co-pay for medications will be in advance as this is different depending on your insurance coverage. However, we may be able to find a substitute medication at lower cost or fill out paperwork to get insurance to cover a needed medication.   If a prior authorization is required to get your medication covered by your insurance company, please allow [place] 1-2 business days to complete this process.  Drug prices often vary depending on where the prescription is filled and some pharmacies may offer cheaper prices.  The website www.goodrx.com contains coupons for medications through different pharmacies. The  prices here do not account for what the cost may be with help from insurance (it may be cheaper with your insurance), but the website can give you the price if you did not use any insurance.  - You can print the associated coupon and take it with your prescription to the pharmacy.  - You may also stop by our office during regular business hours and pick up a GoodRx coupon card.  - If you need your prescription sent electronically to a different pharmacy, notify  our office through [facility] or by phone at [phone] option 4.     Si Usted Necesita Algo Despus de Su Visita  Tambin puede enviarnos un mensaje a travs de Pharmacist, community. Por lo general respondemos a los mensajes de MyChart en el transcurso de 1 a 2 das hbiles.  Para renovar recetas, por favor pida a su farmacia que se ponga en contacto con nuestra oficina. Harland Dingwall de fax es [place] [phone].  Si tiene un asunto urgente cuando la clnica est cerrada y que no puede esperar hasta el siguiente da hbil, puede llamar/localizar a su doctor(a) al nmero que aparece a continuacin.   Por favor, tenga en cuenta que aunque hacemos todo lo posible para estar disponibles para asuntos urgentes fuera del horario de [place], no estamos disponibles las 24 horas del da, los 7 das de la [place].   Si tiene un problema urgente y no puede comunicarse con nosotros, puede optar por buscar atencin mdica  en el consultorio de su doctor(a), en una clnica privada, en un centro de atencin urgente o en una sala de emergencias.  Si tiene Engineering geologist, por favor llame inmediatamente al 911 o vaya a la sala de emergencias.  Nmeros de bper  - Dr. Nehemiah Massed: [phone]  - Dra. Moye: [phone]  - Dra. Nicole Kindred: [phone]  En caso de inclemencias del [place], por favor llame a Johnsie Kindred principal al [phone] para una actualizacin sobre el [place] de cualquier retraso o cierre.  Consejos para la medicacin en dermatologa: Por favor, guarde las cajas en las que vienen los medicamentos de uso tpico para ayudarle a seguir las instrucciones sobre dnde y cmo usarlos. Las farmacias generalmente imprimen las instrucciones del medicamento slo en las cajas y no directamente en los tubos del [place].   Si su medicamento es muy caro, por favor, pngase en contacto con Zigmund Daniel llamando al [phone] y presione la opcin 4 o envenos un mensaje a travs de  Pharmacist, community.   No podemos decirle cul ser su copago por los medicamentos por adelantado ya que esto es diferente dependiendo de la cobertura de su seguro. Sin embargo, es posible que podamos encontrar un medicamento sustituto a Electrical engineer un formulario para que el seguro cubra el medicamento que se considera necesario.   Si se requiere una autorizacin previa para que su compaa de seguros [place] su medicamento, por favor permtanos de 1 a 2 das hbiles para completar este proceso.  Los precios de los medicamentos varan con frecuencia dependiendo del Environmental consultant de dnde se surte la receta y alguna farmacias pueden ofrecer precios ms baratos.  El sitio web www.goodrx.com tiene cupones para medicamentos de Airline pilot. Los precios aqu no tienen en cuenta lo que podra costar con la ayuda del seguro (puede ser ms barato con su seguro), pero el sitio web puede darle el precio si no utiliz Research scientist (physical sciences).  - Puede imprimir el cupn correspondiente y llevarlo con su receta a  la farmacia.  - Tambin puede pasar por nuestra oficina durante el horario de atencin regular y Charity fundraiser una tarjeta de cupones de GoodRx.  - Si necesita que su receta se enve electrnicamente a una farmacia diferente, informe a nuestra oficina a travs de MyChart de [place] o por telfono llamando al [phone] y presione la opcin 4.

## 2021-09-03 NOTE — Progress Notes (Signed)
   New Patient Visit  Subjective  Meredith Castillo is a 40 y.o. female who presents for the following: Skin Problem (Patient here today for a spot at right lower leg. Started as a small pink spot around 20 years ago and patient was told the scar would be worse if removed. The spot started to itch and has gotten darker with a scab about 2 months ago. Patient with no hx of skin cancer. ).   The following portions of the chart were reviewed this encounter and updated as appropriate:   Tobacco  Allergies  Meds  Problems  Med Hx  Surg Hx  Fam Hx      Review of Systems:  No other skin or systemic complaints except as noted in HPI or Assessment and Plan.  Objective  Well appearing patient in no apparent distress; mood and affect are within normal limits.  A focused examination was performed including right lower leg. Relevant physical exam findings are noted in the Assessment and Plan.  right pretibia Excoriation   Right Lower Leg - Anterior Soft superficial subcutaneous nodule   Assessment & Plan  Neoplasm of uncertain behavior of skin right pretibia  mupirocin ointment (BACTROBAN) 2 % Apply 1 application topically daily. With dressing changes  Start mupirocin daily and cover with a band-aid.  By history possibly overlying dermatofibroma vs other Unable to evaluate base. Will recheck in 1 month.   Lipoma of right lower extremity Right Lower Leg - Anterior  Favor lipoma Benign-appearing.  Observation.  Call clinic for new or changing lesions.    Return in about 1 month (around 10/04/2021).  Graciella Belton, RMA, am acting as scribe for Forest Gleason, MD .  Documentation: I have reviewed the above documentation for accuracy and completeness, and I agree with the above.  Forest Gleason, MD

## 2021-09-04 DIAGNOSIS — M792 Neuralgia and neuritis, unspecified: Secondary | ICD-10-CM | POA: Diagnosis not present

## 2021-09-04 DIAGNOSIS — M5416 Radiculopathy, lumbar region: Secondary | ICD-10-CM | POA: Diagnosis not present

## 2021-09-04 DIAGNOSIS — M461 Sacroiliitis, not elsewhere classified: Secondary | ICD-10-CM | POA: Diagnosis not present

## 2021-09-04 DIAGNOSIS — G8929 Other chronic pain: Secondary | ICD-10-CM | POA: Diagnosis not present

## 2021-09-04 DIAGNOSIS — M5441 Lumbago with sciatica, right side: Secondary | ICD-10-CM | POA: Diagnosis not present

## 2021-09-25 ENCOUNTER — Other Ambulatory Visit: Payer: Self-pay

## 2021-09-25 ENCOUNTER — Telehealth: Payer: Self-pay | Admitting: Adult Health

## 2021-09-25 DIAGNOSIS — F5081 Binge eating disorder: Secondary | ICD-10-CM

## 2021-09-25 DIAGNOSIS — F3342 Major depressive disorder, recurrent, in full remission: Secondary | ICD-10-CM

## 2021-09-25 MED ORDER — LISDEXAMFETAMINE DIMESYLATE 40 MG PO CAPS: 40.0000 mg | ORAL_CAPSULE | Freq: Every day | ORAL | 0 refills | Status: DC

## 2021-09-25 NOTE — Telephone Encounter (Signed)
Pt called requesting Rx for Vyvanse 40 mg 1/d @ [facility]. Apt 3/9

## 2021-09-25 NOTE — Telephone Encounter (Signed)
Pended.

## 2021-10-09 ENCOUNTER — Ambulatory Visit: Payer: BC Managed Care – PPO | Admitting: Dermatology

## 2021-11-06 DIAGNOSIS — M461 Sacroiliitis, not elsewhere classified: Secondary | ICD-10-CM | POA: Diagnosis not present

## 2021-11-06 DIAGNOSIS — M4726 Other spondylosis with radiculopathy, lumbar region: Secondary | ICD-10-CM | POA: Diagnosis not present

## 2021-11-06 DIAGNOSIS — G8929 Other chronic pain: Secondary | ICD-10-CM | POA: Diagnosis not present

## 2021-11-06 DIAGNOSIS — M47816 Spondylosis without myelopathy or radiculopathy, lumbar region: Secondary | ICD-10-CM | POA: Diagnosis not present

## 2021-11-06 DIAGNOSIS — M5136 Other intervertebral disc degeneration, lumbar region: Secondary | ICD-10-CM | POA: Diagnosis not present

## 2021-11-26 ENCOUNTER — Other Ambulatory Visit: Payer: Self-pay | Admitting: Adult Health

## 2021-11-26 DIAGNOSIS — F3342 Major depressive disorder, recurrent, in full remission: Secondary | ICD-10-CM

## 2021-11-26 DIAGNOSIS — F411 Generalized anxiety disorder: Secondary | ICD-10-CM

## 2021-12-04 DIAGNOSIS — M4726 Other spondylosis with radiculopathy, lumbar region: Secondary | ICD-10-CM | POA: Diagnosis not present

## 2021-12-04 DIAGNOSIS — M47816 Spondylosis without myelopathy or radiculopathy, lumbar region: Secondary | ICD-10-CM | POA: Diagnosis not present

## 2021-12-04 DIAGNOSIS — M5136 Other intervertebral disc degeneration, lumbar region: Secondary | ICD-10-CM | POA: Diagnosis not present

## 2021-12-04 DIAGNOSIS — Z79891 Long term (current) use of opiate analgesic: Secondary | ICD-10-CM | POA: Diagnosis not present

## 2021-12-04 DIAGNOSIS — G894 Chronic pain syndrome: Secondary | ICD-10-CM | POA: Diagnosis not present

## 2021-12-05 ENCOUNTER — Other Ambulatory Visit: Payer: Self-pay

## 2021-12-05 ENCOUNTER — Ambulatory Visit (INDEPENDENT_AMBULATORY_CARE_PROVIDER_SITE_OTHER): Payer: BC Managed Care – PPO | Admitting: Adult Health

## 2021-12-05 ENCOUNTER — Encounter: Payer: Self-pay | Admitting: Adult Health

## 2021-12-05 DIAGNOSIS — E282 Polycystic ovarian syndrome: Secondary | ICD-10-CM | POA: Diagnosis not present

## 2021-12-05 DIAGNOSIS — F411 Generalized anxiety disorder: Secondary | ICD-10-CM | POA: Diagnosis not present

## 2021-12-05 DIAGNOSIS — F5081 Binge eating disorder: Secondary | ICD-10-CM | POA: Diagnosis not present

## 2021-12-05 DIAGNOSIS — F3342 Major depressive disorder, recurrent, in full remission: Secondary | ICD-10-CM

## 2021-12-05 MED ORDER — LISDEXAMFETAMINE DIMESYLATE 40 MG PO CAPS: 40.0000 mg | ORAL_CAPSULE | Freq: Every day | ORAL | 0 refills | Status: DC

## 2021-12-05 MED ORDER — TRAZODONE HCL 50 MG PO TABS: 50.0000 mg | ORAL_TABLET | Freq: Every day | ORAL | 3 refills | Status: DC

## 2021-12-05 MED ORDER — PAROXETINE HCL 30 MG PO TABS: 30.0000 mg | ORAL_TABLET | Freq: Every day | ORAL | 3 refills | Status: DC

## 2021-12-05 NOTE — Progress Notes (Signed)
Meredith Castillo [id number] 1981-03-30 41 y.o.  Subjective:   Patient ID:  Meredith Castillo is a 41 y.o. (DOB [birth year]) female.  Chief Complaint: No chief complaint on file.   HPI Meredith Castillo presents to the office today for follow-up of PCOS, MDD, GAD, Binge eating disorder.  Describes mood today as "ok". Pleasant. Mood symptoms - reports depression, anxiety and irritability "at times". Mood is consistent. Stating "I'm doing ok". Feels like medications are working well for her. Stable interest and motivation. Taking medications as prescribed.  Energy levels stable. Active, does not have a regular exercise routine.  Enjoys some usual interests and activities. Widowed. Lives alone with 2 daughters. Husband's family local and supportive. Appetite adequate. Weight stable - 280 pounds. Sleeps well most nights. Averages 6.5 to 7 hours with Trazadone. Focus and concentration stable. Completing tasks. Managing aspects of household. 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 Denies SI or HI.  Denies AH or VH. Denies self harm.  Previous medication trials: Unknown      Review of Systems:  Review of Systems  Musculoskeletal:  Negative for gait problem.  Neurological:  Negative for tremors.  Psychiatric/Behavioral:         Please refer to HPI   Medications: I have reviewed the patient's current medications.  Current Outpatient Medications  Medication Sig Dispense Refill   promethazine (PHENERGAN) 25 MG tablet TAKE 1 TABLET BY MOUTH EVERY 6 HOURS AS NEEDED FOR NAUSEA AND VOMITING 30 tablet 2   albuterol (PROVENTIL HFA;VENTOLIN HFA) 108 (90 BASE) MCG/ACT inhaler Inhale 2 puffs into the lungs every 4 (four) hours as needed for wheezing or shortness of breath. 1 Inhaler 3   cetirizine (ZYRTEC) 10 MG tablet Take 10 mg by mouth daily.     fluticasone (FLONASE) 50 MCG/ACT nasal spray Place 1 spray into both nostrils daily as needed for allergies.     folic acid (FOLVITE) 1 MG tablet  Take 1 mg by mouth daily.     gabapentin (NEURONTIN) 300 MG capsule TAKE 1 CAPSULE BY MOUTH THREE TIMES A DAY AS DIRECTED (INCREASE IN FREQUENCY)     HYDROcodone-acetaminophen (NORCO) 10-325 MG tablet TAKE 1 TABLET BY MOUTH EVERY 4 HOURS WHILE AWAKE AS NEEDED FOR PAIN     ibuprofen (ADVIL,MOTRIN) 200 MG tablet Take 400-800 mg by mouth every 8 (eight) hours as needed for headache or moderate pain.      levonorgestrel (MIRENA) 20 MCG/24HR IUD 1 each by Intrauterine route once.     lisdexamfetamine (VYVANSE) 40 MG capsule Take 1 capsule (40 mg total) by mouth daily after breakfast. 30 capsule 0   [START ON 01/02/2022] lisdexamfetamine (VYVANSE) 40 MG capsule Take 1 capsule (40 mg total) by mouth daily after breakfast. 30 capsule 0   [START ON 01/30/2022] lisdexamfetamine (VYVANSE) 40 MG capsule Take 1 capsule (40 mg total) by mouth daily after breakfast. 30 capsule 0   Multiple Vitamins-Minerals (MULTIVITAMIN WITH MINERALS) tablet Take 1 tablet by mouth daily.     mupirocin ointment (BACTROBAN) 2 % Place 1 application into the nose as needed (for wound care). 22 g 1   mupirocin ointment (BACTROBAN) 2 % Apply 1 application topically daily. With dressing changes 22 g 0   nystatin ointment (MYCOSTATIN) Apply 1 application topically 2 (two) times daily. 30 g 0   PARoxetine (PAXIL) 30 MG tablet Take 1 tablet (30 mg total) by mouth daily at 10 pm. 90 tablet 3   rivaroxaban (XARELTO) 20 MG TABS tablet  Take 1 tablet (20 mg total) by mouth daily with supper. 30 tablet 2   RIVAROXABAN (XARELTO) VTE STARTER PACK (15 & 20 MG) Follow package directions: Take one '15mg'$  tablet by mouth twice a day. On day 22, switch to one '20mg'$  tablet once a day. Take with food. 51 each 0   tiZANidine (ZANAFLEX) 4 MG tablet TAKE 1 TABLET BY MOUTH THREE TIMES A DAY AS NEEDED FOR PAIN FOR BACK MUSCLE SPASM, WILL MAKE DROWSY     traZODone (DESYREL) 50 MG tablet Take 1 tablet (50 mg total) by mouth at bedtime. 90 tablet 3   valACYclovir  (VALTREX) 1000 MG tablet Take 2 tablets (2,000 mg total) by mouth 2 (two) times daily. 4 tablet 1   No current facility-administered medications for this visit.    Medication Side Effects: None  Allergies:  Allergies  Allergen Reactions   Vancomycin Other (See Comments)    Patient states that when this medication is given intravenously, it causes her kidney's to shut down.   Neosporin [Neomycin-Polymyxin-Gramicidin] Other (See Comments)    Patient states that this medication causes a poison ivy rash like reaction.    Past Medical History:  Diagnosis Date   Asthma    Bariatric surgery status complicating pregnancy, childbirth, or the puerperium, antepartum condition or complication 6269   HTN (hypertension)    Iron deficiency anemia    due to gastric bypass.    Mild asthma 01/10/2014   Morbid obesity with BMI of 40.0-44.9, adult ([place])    PCOS (polycystic ovarian syndrome)    PP care - s/p SVD 2/19 11/19/2011   Unspecified vitamin D deficiency    Vitamin B12 deficiency    due to gastric bypass.     Past Medical History, Surgical history, Social history, and Family history were reviewed and updated as appropriate.   Please see review of systems for further details on the patient's review from today.   Objective:   Physical Exam:  There were no vitals taken for this visit.  Physical Exam Constitutional:      General: She is not in acute distress. Musculoskeletal:        General: No deformity.  Neurological:     Mental Status: She is alert and oriented to person, place, and time.     Coordination: Coordination normal.  Psychiatric:        Attention and Perception: Attention and perception normal. She does not perceive auditory or visual hallucinations.        Mood and Affect: Mood normal. Mood is not anxious or depressed. Affect is not labile, blunt, angry or inappropriate.        Speech: Speech normal.        Behavior: Behavior normal.        Thought Content: Thought  content normal. Thought content is not paranoid or delusional. Thought content does not include homicidal or suicidal ideation. Thought content does not include homicidal or suicidal plan.        Cognition and Memory: Cognition and memory normal.        Judgment: Judgment normal.     Comments: Insight intact    Lab Review:     Component Value Date/Time   NA 136 10/19/2019 1033   NA 140 01/05/2013 0939   K 4.6 10/19/2019 1033   K 4.7 01/05/2013 0939   CL 102 10/19/2019 1033   CL 106 01/05/2013 0939   CO2 30 10/19/2019 1033   CO2 27 01/05/2013 0939   GLUCOSE  100 (H) 10/19/2019 1033   GLUCOSE 75 01/05/2013 0939   BUN 7 10/19/2019 1033   BUN 11.7 01/05/2013 0939   CREATININE 0.75 10/19/2019 1033   CREATININE 0.8 01/05/2013 0939   CALCIUM 9.0 10/19/2019 1033   CALCIUM 9.4 01/05/2013 0939   PROT 6.8 10/19/2019 1033   PROT 7.5 01/05/2013 0939   ALBUMIN 4.0 10/19/2019 1033   ALBUMIN 3.8 01/05/2013 0939   AST 22 10/19/2019 1033   AST 12 01/05/2013 0939   ALT 14 10/19/2019 1033   ALT 13 01/05/2013 0939   ALKPHOS 79 10/19/2019 1033   ALKPHOS 103 01/05/2013 0939   BILITOT 0.5 10/19/2019 1033   BILITOT 0.52 01/05/2013 0939   GFRNONAA >60 08/04/2016 1146   GFRAA >60 08/04/2016 1146       Component Value Date/Time   WBC 6.8 10/19/2019 1033   RBC 4.36 10/19/2019 1033   HGB 12.5 10/19/2019 1033   HGB 13.1 09/05/2013 1552   HCT 38.9 10/19/2019 1033   HCT 40.1 09/05/2013 1552   PLT 260.0 10/19/2019 1033   PLT 257 09/05/2013 1552   MCV 89.2 10/19/2019 1033   MCV 88.7 09/05/2013 1552   MCH 22.9 (L) 08/04/2016 1146   MCHC 32.3 10/19/2019 1033   RDW 15.5 10/19/2019 1033   RDW 13.9 09/05/2013 1552   LYMPHSABS 1.4 10/19/2019 1033   LYMPHSABS 2.7 09/05/2013 1552   MONOABS 0.6 10/19/2019 1033   MONOABS 0.6 09/05/2013 1552   EOSABS 0.4 10/19/2019 1033   EOSABS 0.4 09/05/2013 1552   BASOSABS 0.0 10/19/2019 1033   BASOSABS 0.0 09/05/2013 1552    No results found for: POCLITH,  LITHIUM   No results found for: PHENYTOIN, PHENOBARB, VALPROATE, CBMZ   .res Assessment: Plan:    Plan:  PDMP reviewed  Trazadone '50mg'$  at bedtime Vyvanse 40 mg every morning  Paxil 30 mg every evening at bedtime D/C Valium 10 mg twice daily - taking ain medication.  BP 134/91 85  RTC 6 months  Patient advised to contact office with any questions, adverse effects, or acute worsening in signs and symptoms.  Discussed potential benefits, risk, and side effects of benzodiazepines to include potential risk of tolerance and dependence, as well as possible drowsiness.  Advised patient not to drive if experiencing drowsiness and to take lowest possible effective dose to minimize risk of dependence and tolerance.  Discussed potential benefits, risks, and side effects of stimulants with patient to include increased heart rate, palpitations, insomnia, increased anxiety, increased irritability, or decreased appetite.  Instructed patient to contact office if experiencing any significant tolerability issues. Diagnoses and all orders for this visit:  PCOS (polycystic ovarian syndrome)  Binge eating disorder -     lisdexamfetamine (VYVANSE) 40 MG capsule; Take 1 capsule (40 mg total) by mouth daily after breakfast. -     lisdexamfetamine (VYVANSE) 40 MG capsule; Take 1 capsule (40 mg total) by mouth daily after breakfast. -     lisdexamfetamine (VYVANSE) 40 MG capsule; Take 1 capsule (40 mg total) by mouth daily after breakfast.  Major depressive disorder, recurrent episode, in full remission ([place]) -     lisdexamfetamine (VYVANSE) 40 MG capsule; Take 1 capsule (40 mg total) by mouth daily after breakfast. -     PARoxetine (PAXIL) 30 MG tablet; Take 1 tablet (30 mg total) by mouth daily at 10 pm. -     traZODone (DESYREL) 50 MG tablet; Take 1 tablet (50 mg total) by mouth at bedtime.  Generalized anxiety disorder -  PARoxetine (PAXIL) 30 MG tablet; Take 1 tablet (30 mg total) by mouth daily  at 10 pm. -     traZODone (DESYREL) 50 MG tablet; Take 1 tablet (50 mg total) by mouth at bedtime.     Please see After Visit Summary for patient specific instructions.  No future appointments.  No orders of the defined types were placed in this encounter.   -------------------------------

## 2021-12-09 ENCOUNTER — Other Ambulatory Visit: Payer: Self-pay

## 2021-12-09 ENCOUNTER — Ambulatory Visit (INDEPENDENT_AMBULATORY_CARE_PROVIDER_SITE_OTHER): Payer: BC Managed Care – PPO | Admitting: Family Medicine

## 2021-12-09 VITALS — BP 130/84 | HR 93 | Temp 97.5°F | Wt 304.6 lb

## 2021-12-09 DIAGNOSIS — R030 Elevated blood-pressure reading, without diagnosis of hypertension: Secondary | ICD-10-CM | POA: Diagnosis not present

## 2021-12-09 DIAGNOSIS — T148XXA Other injury of unspecified body region, initial encounter: Secondary | ICD-10-CM

## 2021-12-09 NOTE — Progress Notes (Signed)
?[facility] ?[facility] ?[place] ?[place] [address] ?Dept: [phone] ?Dept Fax: [phone] ? ?Office Visit ? ?Subjective:  ? ? Patient ID: Meredith Castillo, female    DOB: 28-Dec-1980, 41 y.o..   MRN: [id number] ? ?Chief Complaint  ?Patient presents with  ? Acute Visit  ?  C/o having a sore spot the bottom of LT foot x 3 days. Also having elevated BP x 4-5 months. BP at other doctors appointments 158/103.    ? ? ?History of Present Illness: ? ?Patient is in today for with a 3-day history of left heel pain. She notes this came on fairly acutely. She had a family member look at this, who felt they may have seen a small wire stuck into the skin. She has been using a cushioned dressing over the area. She is not sure of what she may have stepped on. ? ?Meredith Castillo also notes that she has had some elevated blood pressures at various doctor appointments in the past 3 months. It has apparently been as high as 160/108. She does not check her blood pressure at home. ? ?Past Medical History: ?Patient Active Problem List  ? Diagnosis Date Noted  ? DVT (deep venous thrombosis) ([place]) 12/14/2020  ? HSV (herpes simplex virus) infection 10/21/2019  ? Cellulitis of lower extremity 10/21/2019  ? Cellulitis of umbilicus 93/26/7124  ? Binge eating disorder 08/17/2019  ? Blurred vision, right eye 12/01/2018  ? Major depressive disorder, recurrent episode, in full remission ([place]) 10/10/2018  ? Generalized anxiety disorder 10/10/2018  ? Bilateral leg edema 08/05/2016  ? Allergic rhinitis 12/17/2012  ? H/O gastric bypass 12/17/2012  ? PCOS (polycystic ovarian syndrome)   ? Morbid obesity with BMI of 40.0-44.9, adult ([place])   ? Vitamin B12 deficiency   ? ?Past Surgical History:  ?Procedure Laterality Date  ? APPENDECTOMY  2005  ? GASTRIC BYPASS  2008  ? Rou-en-Y  ? gynecologic surgery  2010  ? HSG  ? INTRAUTERINE DEVICE INSERTION    ? NASAL SEPTUM SURGERY  1996  ? WISDOM TOOTH EXTRACTION     ? ?Family History  ?Problem Relation Age of Onset  ? Hypertension Mother   ? Diabetes Mother   ? Heart disease Mother   ? Cancer Maternal Grandfather   ?     brain  ? Heart attack Paternal Grandfather   ? Anesthesia problems Neg Hx   ? ?Outpatient Medications Prior to Visit  ?Medication Sig Dispense Refill  ? albuterol (PROVENTIL HFA;VENTOLIN HFA) 108 (90 BASE) MCG/ACT inhaler Inhale 2 puffs into the lungs every 4 (four) hours as needed for wheezing or shortness of breath. 1 Inhaler 3  ? cetirizine (ZYRTEC) 10 MG tablet Take 10 mg by mouth daily.    ? diazepam (VALIUM) 10 MG tablet Take by mouth.    ? fluticasone (FLONASE) 50 MCG/ACT nasal spray Place 1 spray into both nostrils daily as needed for allergies.    ? folic acid (FOLVITE) 1 MG tablet Take 1 mg by mouth daily.    ? gabapentin (NEURONTIN) 300 MG capsule TAKE 1 CAPSULE BY MOUTH THREE TIMES A DAY AS DIRECTED (INCREASE IN FREQUENCY)    ? HYDROcodone-acetaminophen (NORCO) 10-325 MG tablet TAKE 1 TABLET BY MOUTH EVERY 4 HOURS WHILE AWAKE AS NEEDED FOR PAIN    ? ibuprofen (ADVIL,MOTRIN) 200 MG tablet Take 400-800 mg by mouth every 8 (eight) hours as needed for headache or moderate pain.     ? levonorgestrel (MIRENA)  20 MCG/24HR IUD 1 each by Intrauterine route once.    ? lisdexamfetamine (VYVANSE) 40 MG capsule Take 1 capsule (40 mg total) by mouth daily after breakfast. 30 capsule 0  ? Multiple Vitamins-Minerals (MULTIVITAMIN WITH MINERALS) tablet Take 1 tablet by mouth daily.    ? mupirocin ointment (BACTROBAN) 2 % Place 1 application into the nose as needed (for wound care). 22 g 1  ? PARoxetine (PAXIL) 30 MG tablet Take 1 tablet (30 mg total) by mouth daily at 10 pm. 90 tablet 3  ? pregabalin (LYRICA) 100 MG capsule Take 100 mg by mouth 3 (three) times daily.    ? promethazine (PHENERGAN) 25 MG tablet TAKE 1 TABLET BY MOUTH EVERY 6 HOURS AS NEEDED FOR NAUSEA AND VOMITING 30 tablet 2  ? rivaroxaban (XARELTO) 20 MG TABS tablet Take 1 tablet (20 mg total) by  mouth daily with supper. 30 tablet 2  ? traZODone (DESYREL) 50 MG tablet Take 1 tablet (50 mg total) by mouth at bedtime. 90 tablet 3  ? valACYclovir (VALTREX) 1000 MG tablet Take 2 tablets (2,000 mg total) by mouth 2 (two) times daily. 4 tablet 1  ? nystatin ointment (MYCOSTATIN) Apply 1 application topically 2 (two) times daily. 30 g 0  ? tiZANidine (ZANAFLEX) 4 MG tablet TAKE 1 TABLET BY MOUTH THREE TIMES A DAY AS NEEDED FOR PAIN FOR BACK MUSCLE SPASM, WILL MAKE DROWSY    ? [START ON 01/02/2022] lisdexamfetamine (VYVANSE) 40 MG capsule Take 1 capsule (40 mg total) by mouth daily after breakfast. 30 capsule 0  ? [START ON 01/30/2022] lisdexamfetamine (VYVANSE) 40 MG capsule Take 1 capsule (40 mg total) by mouth daily after breakfast. 30 capsule 0  ? mupirocin ointment (BACTROBAN) 2 % Apply 1 application topically daily. With dressing changes 22 g 0  ? RIVAROXABAN (XARELTO) VTE STARTER PACK (15 & 20 MG) Follow package directions: Take one '15mg'$  tablet by mouth twice a day. On day 22, switch to one '20mg'$  tablet once a day. Take with food. 51 each 0  ? ?No facility-administered medications prior to visit.  ? ?Allergies  ?Allergen Reactions  ? Vancomycin Other (See Comments)  ?  Patient states that when this medication is given intravenously, it causes her kidney's to shut down.  ? Neosporin [Neomycin-Polymyxin-Gramicidin] Other (See Comments)  ?  Patient states that this medication causes a poison ivy rash like reaction.  ?   ?Objective:  ? ?Today's Vitals  ? 12/09/21 1516  ?BP: 130/84  ?Pulse: 93  ?Temp: (!) 97.5 ?F (36.4 ?C)  ?TempSrc: Temporal  ?SpO2: 97%  ?Weight: (!) 304 lb 9.6 oz (138.2 kg)  ? ?Body mass index is 47.71 kg/m?.  ? ?General: Well developed, well nourished. No acute distress. ?Foot: There is a 1-2 mm slit int he skin of the heel at the point that the patient notes pain with  ? palpation. There is no visible redness or swelling and no visible foreign body. However, with  ? pressure, I was able to express  4 small drops of pus. ?Psych: Alert and oriented. Normal mood and affect. ? ?Health Maintenance Due  ?Topic Date Due  ? Hepatitis C Screening  Never done  ? TETANUS/TDAP  Never done  ? PAP SMEAR-Modifier  Never done  ?   ?Assessment & Plan:  ? ?1. Splinter in skin ?Meredith Castillo does appear to have stepped on a foreign body, possible a glass sliver. At this point, I can't see the foreign body and do not feel  it would be appropriate to blindly cut into the skin. I believe it will eventually work its way to the surface. In the meantime, I recommend she keep the skin clean and covered. She might also use a Dr. Felicie Morn pad to take the pressure of this site. We reviewed concerning signs or symptoms that should prompt her to follow-up with her PCP. ? ?2. Elevated blood pressure reading without diagnosis of hypertension ?Meredith Castillo's blood pressure is borderline elevated today. I recommend she obtain a home blood pressure cuff and monitor this at home. If she is finding consistently elevated pressure > 135/85, she should discuss further with her PCP. ? ?Return if symptoms worsen or fail to improve.  ? ?Haydee Salter, MD ?

## 2021-12-18 DIAGNOSIS — M6281 Muscle weakness (generalized): Secondary | ICD-10-CM | POA: Diagnosis not present

## 2021-12-18 DIAGNOSIS — M5386 Other specified dorsopathies, lumbar region: Secondary | ICD-10-CM | POA: Diagnosis not present

## 2021-12-18 DIAGNOSIS — M4726 Other spondylosis with radiculopathy, lumbar region: Secondary | ICD-10-CM | POA: Diagnosis not present

## 2021-12-19 DIAGNOSIS — M4726 Other spondylosis with radiculopathy, lumbar region: Secondary | ICD-10-CM | POA: Diagnosis not present

## 2021-12-24 DIAGNOSIS — M4726 Other spondylosis with radiculopathy, lumbar region: Secondary | ICD-10-CM | POA: Diagnosis not present

## 2021-12-25 DIAGNOSIS — G894 Chronic pain syndrome: Secondary | ICD-10-CM | POA: Diagnosis not present

## 2021-12-25 DIAGNOSIS — M47816 Spondylosis without myelopathy or radiculopathy, lumbar region: Secondary | ICD-10-CM | POA: Diagnosis not present

## 2021-12-25 DIAGNOSIS — Z79891 Long term (current) use of opiate analgesic: Secondary | ICD-10-CM | POA: Diagnosis not present

## 2021-12-25 DIAGNOSIS — M5136 Other intervertebral disc degeneration, lumbar region: Secondary | ICD-10-CM | POA: Diagnosis not present

## 2021-12-26 DIAGNOSIS — M4726 Other spondylosis with radiculopathy, lumbar region: Secondary | ICD-10-CM | POA: Diagnosis not present

## 2021-12-30 DIAGNOSIS — M5416 Radiculopathy, lumbar region: Secondary | ICD-10-CM | POA: Diagnosis not present

## 2021-12-30 DIAGNOSIS — R29898 Other symptoms and signs involving the musculoskeletal system: Secondary | ICD-10-CM | POA: Diagnosis not present

## 2021-12-30 DIAGNOSIS — M6281 Muscle weakness (generalized): Secondary | ICD-10-CM | POA: Diagnosis not present

## 2022-01-01 DIAGNOSIS — R29898 Other symptoms and signs involving the musculoskeletal system: Secondary | ICD-10-CM | POA: Diagnosis not present

## 2022-01-01 DIAGNOSIS — M5416 Radiculopathy, lumbar region: Secondary | ICD-10-CM | POA: Diagnosis not present

## 2022-01-01 DIAGNOSIS — M6281 Muscle weakness (generalized): Secondary | ICD-10-CM | POA: Diagnosis not present

## 2022-01-08 DIAGNOSIS — M6281 Muscle weakness (generalized): Secondary | ICD-10-CM | POA: Diagnosis not present

## 2022-01-08 DIAGNOSIS — R29898 Other symptoms and signs involving the musculoskeletal system: Secondary | ICD-10-CM | POA: Diagnosis not present

## 2022-01-08 DIAGNOSIS — M5416 Radiculopathy, lumbar region: Secondary | ICD-10-CM | POA: Diagnosis not present

## 2022-01-13 DIAGNOSIS — R29898 Other symptoms and signs involving the musculoskeletal system: Secondary | ICD-10-CM | POA: Diagnosis not present

## 2022-01-13 DIAGNOSIS — M5416 Radiculopathy, lumbar region: Secondary | ICD-10-CM | POA: Diagnosis not present

## 2022-01-13 DIAGNOSIS — M6281 Muscle weakness (generalized): Secondary | ICD-10-CM | POA: Diagnosis not present

## 2022-01-15 DIAGNOSIS — G8929 Other chronic pain: Secondary | ICD-10-CM | POA: Diagnosis not present

## 2022-01-15 DIAGNOSIS — M5441 Lumbago with sciatica, right side: Secondary | ICD-10-CM | POA: Diagnosis not present

## 2022-01-15 DIAGNOSIS — M5416 Radiculopathy, lumbar region: Secondary | ICD-10-CM | POA: Diagnosis not present

## 2022-01-24 DIAGNOSIS — G894 Chronic pain syndrome: Secondary | ICD-10-CM | POA: Diagnosis not present

## 2022-01-24 DIAGNOSIS — Z79891 Long term (current) use of opiate analgesic: Secondary | ICD-10-CM | POA: Diagnosis not present

## 2022-01-24 DIAGNOSIS — M5136 Other intervertebral disc degeneration, lumbar region: Secondary | ICD-10-CM | POA: Diagnosis not present

## 2022-01-24 DIAGNOSIS — M47816 Spondylosis without myelopathy or radiculopathy, lumbar region: Secondary | ICD-10-CM | POA: Diagnosis not present

## 2022-01-28 ENCOUNTER — Ambulatory Visit (HOSPITAL_COMMUNITY)
Admission: RE | Admit: 2022-01-28 | Discharge: 2022-01-28 | Disposition: A | Discharge status: Home / Self Care | Payer: BC Managed Care – PPO | Source: Ambulatory Visit | Attending: Orthopedic Surgery | Admitting: Orthopedic Surgery

## 2022-01-28 ENCOUNTER — Other Ambulatory Visit (HOSPITAL_COMMUNITY): Payer: Self-pay | Admitting: Orthopedic Surgery

## 2022-01-28 DIAGNOSIS — M25561 Pain in right knee: Secondary | ICD-10-CM | POA: Diagnosis not present

## 2022-01-28 DIAGNOSIS — M79604 Pain in right leg: Secondary | ICD-10-CM

## 2022-01-28 DIAGNOSIS — M7989 Other specified soft tissue disorders: Secondary | ICD-10-CM | POA: Diagnosis not present

## 2022-02-06 DIAGNOSIS — M25561 Pain in right knee: Secondary | ICD-10-CM | POA: Diagnosis not present

## 2022-02-11 DIAGNOSIS — Z79891 Long term (current) use of opiate analgesic: Secondary | ICD-10-CM | POA: Diagnosis not present

## 2022-02-11 DIAGNOSIS — G894 Chronic pain syndrome: Secondary | ICD-10-CM | POA: Diagnosis not present

## 2022-02-11 DIAGNOSIS — M47816 Spondylosis without myelopathy or radiculopathy, lumbar region: Secondary | ICD-10-CM | POA: Diagnosis not present

## 2022-02-17 DIAGNOSIS — M25561 Pain in right knee: Secondary | ICD-10-CM | POA: Diagnosis not present

## 2022-03-27 ENCOUNTER — Telehealth: Payer: Self-pay | Admitting: Adult Health

## 2022-03-27 ENCOUNTER — Other Ambulatory Visit: Payer: Self-pay

## 2022-03-27 DIAGNOSIS — F5081 Binge eating disorder: Secondary | ICD-10-CM

## 2022-03-27 MED ORDER — LISDEXAMFETAMINE DIMESYLATE 40 MG PO CAPS: 40.0000 mg | ORAL_CAPSULE | Freq: Every day | ORAL | 0 refills | Status: DC

## 2022-03-27 NOTE — Telephone Encounter (Signed)
Next visit is 06/06/22. Requesting refill on her Vyvanse called to:  [id number] [place] [place] Phone:  [phone] Fax:  [phone]

## 2022-03-27 NOTE — Telephone Encounter (Signed)
Pended.

## 2022-04-29 ENCOUNTER — Other Ambulatory Visit: Payer: Self-pay

## 2022-04-29 ENCOUNTER — Telehealth: Payer: Self-pay | Admitting: Adult Health

## 2022-04-29 DIAGNOSIS — F5081 Binge eating disorder: Secondary | ICD-10-CM

## 2022-04-29 MED ORDER — LISDEXAMFETAMINE DIMESYLATE 40 MG PO CAPS: 40.0000 mg | ORAL_CAPSULE | Freq: Every day | ORAL | 0 refills | Status: DC

## 2022-04-29 NOTE — Telephone Encounter (Signed)
Pended.

## 2022-04-29 NOTE — Telephone Encounter (Signed)
Patient lvm at 11:33 requesting refill for Vyvanse '40mg'$ . Ph: [phone] Appt 9/[facility] [address] [place]

## 2022-05-06 DIAGNOSIS — Z79891 Long term (current) use of opiate analgesic: Secondary | ICD-10-CM | POA: Diagnosis not present

## 2022-05-06 DIAGNOSIS — G894 Chronic pain syndrome: Secondary | ICD-10-CM | POA: Diagnosis not present

## 2022-05-06 DIAGNOSIS — M47816 Spondylosis without myelopathy or radiculopathy, lumbar region: Secondary | ICD-10-CM | POA: Diagnosis not present

## 2022-05-06 DIAGNOSIS — F1120 Opioid dependence, uncomplicated: Secondary | ICD-10-CM | POA: Diagnosis not present

## 2022-05-30 ENCOUNTER — Other Ambulatory Visit: Payer: Self-pay

## 2022-05-30 ENCOUNTER — Telehealth: Payer: Self-pay | Admitting: Adult Health

## 2022-05-30 DIAGNOSIS — F5081 Binge eating disorder: Secondary | ICD-10-CM

## 2022-05-30 MED ORDER — LISDEXAMFETAMINE DIMESYLATE 40 MG PO CAPS: 40.0000 mg | ORAL_CAPSULE | Freq: Every day | ORAL | 0 refills | Status: DC

## 2022-05-30 NOTE — Telephone Encounter (Signed)
Pended.

## 2022-05-30 NOTE — Telephone Encounter (Signed)
Patient called in for refill on Vyvanse '40mg'$ . Ph: [phone] Appt 9/8 [facility]

## 2022-06-06 ENCOUNTER — Ambulatory Visit (INDEPENDENT_AMBULATORY_CARE_PROVIDER_SITE_OTHER): Payer: BC Managed Care – PPO | Admitting: Adult Health

## 2022-06-06 ENCOUNTER — Encounter: Payer: Self-pay | Admitting: Adult Health

## 2022-06-06 DIAGNOSIS — F5081 Binge eating disorder: Secondary | ICD-10-CM

## 2022-06-06 DIAGNOSIS — F3342 Major depressive disorder, recurrent, in full remission: Secondary | ICD-10-CM | POA: Diagnosis not present

## 2022-06-06 DIAGNOSIS — F411 Generalized anxiety disorder: Secondary | ICD-10-CM | POA: Diagnosis not present

## 2022-06-06 DIAGNOSIS — G4700 Insomnia, unspecified: Secondary | ICD-10-CM

## 2022-06-06 DIAGNOSIS — E282 Polycystic ovarian syndrome: Secondary | ICD-10-CM | POA: Diagnosis not present

## 2022-06-06 MED ORDER — LISDEXAMFETAMINE DIMESYLATE 40 MG PO CAPS: 40.0000 mg | ORAL_CAPSULE | Freq: Every day | ORAL | 0 refills | Status: DC

## 2022-06-06 MED ORDER — LISDEXAMFETAMINE DIMESYLATE 40 MG PO CAPS: 40.0000 mg | ORAL_CAPSULE | ORAL | 0 refills | Status: DC

## 2022-06-06 NOTE — Progress Notes (Signed)
Meredith Castillo [id number] [birth year] 41 y.o.  Subjective:   Patient ID:  Meredith Castillo is a 41 y.o. (DOB 1981/08/15) female.  Chief Complaint: No chief complaint on file.   HPI Meredith Castillo presents to the office today for follow-up of PCOS, MDD, GAD, Binge eating disorder.  Describes mood today as "ok". Pleasant. Mood symptoms - reports depression, anxiety and irritability "sometimes". Mood is consistent. Stating "I'm doing alright". Feels like her medication regimen works well for her. Stable interest and motivation. Taking medications as prescribed.  Energy levels stable. Active, does not have a regular exercise routine.  Enjoys some usual interests and activities. Widowed. Lives alone with 2 daughters - 93 and 75. Husband's family local and supportive. Appetite adequate. Weight stable - 280 pounds. Sleeps well most nights. Averages 6.5 to 7 hours with Trazadone. Focus and concentration stable. Completing tasks. Managing aspects of household. 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 Denies SI or HI.  Denies AH or VH. Denies self harm.  Previous medication trials: Unknown    Review of Systems:  Review of Systems  Musculoskeletal:  Negative for gait problem.  Neurological:  Negative for tremors.  Psychiatric/Behavioral:         Please refer to HPI    Medications: I have reviewed the patient's current medications.  Current Outpatient Medications  Medication Sig Dispense Refill   albuterol (PROVENTIL HFA;VENTOLIN HFA) 108 (90 BASE) MCG/ACT inhaler Inhale 2 puffs into the lungs every 4 (four) hours as needed for wheezing or shortness of breath. 1 Inhaler 3   cetirizine (ZYRTEC) 10 MG tablet Take 10 mg by mouth daily.     diazepam (VALIUM) 10 MG tablet Take by mouth.     fluticasone (FLONASE) 50 MCG/ACT nasal spray Place 1 spray into both nostrils daily as needed for allergies.     folic acid (FOLVITE) 1 MG tablet Take 1 mg by mouth daily.     gabapentin (NEURONTIN)  300 MG capsule TAKE 1 CAPSULE BY MOUTH THREE TIMES A DAY AS DIRECTED (INCREASE IN FREQUENCY)     HYDROcodone-acetaminophen (NORCO) 10-325 MG tablet TAKE 1 TABLET BY MOUTH EVERY 4 HOURS WHILE AWAKE AS NEEDED FOR PAIN     ibuprofen (ADVIL,MOTRIN) 200 MG tablet Take 400-800 mg by mouth every 8 (eight) hours as needed for headache or moderate pain.      levonorgestrel (MIRENA) 20 MCG/24HR IUD 1 each by Intrauterine route once.     lisdexamfetamine (VYVANSE) 40 MG capsule Take 1 capsule (40 mg total) by mouth daily after breakfast. 30 capsule 0   [START ON 07/04/2022] lisdexamfetamine (VYVANSE) 40 MG capsule Take 1 capsule (40 mg total) by mouth every morning. 30 capsule 0   [START ON 08/01/2022] lisdexamfetamine (VYVANSE) 40 MG capsule Take 1 capsule (40 mg total) by mouth every morning. 30 capsule 0   Multiple Vitamins-Minerals (MULTIVITAMIN WITH MINERALS) tablet Take 1 tablet by mouth daily.     mupirocin ointment (BACTROBAN) 2 % Place 1 application into the nose as needed (for wound care). 22 g 1   PARoxetine (PAXIL) 30 MG tablet Take 1 tablet (30 mg total) by mouth daily at 10 pm. 90 tablet 3   pregabalin (LYRICA) 100 MG capsule Take 100 mg by mouth 3 (three) times daily.     promethazine (PHENERGAN) 25 MG tablet TAKE 1 TABLET BY MOUTH EVERY 6 HOURS AS NEEDED FOR NAUSEA AND VOMITING 30 tablet 2   rivaroxaban (XARELTO) 20 MG TABS tablet Take 1 tablet (  20 mg total) by mouth daily with supper. 30 tablet 2   traZODone (DESYREL) 50 MG tablet Take 1 tablet (50 mg total) by mouth at bedtime. 90 tablet 3   valACYclovir (VALTREX) 1000 MG tablet Take 2 tablets (2,000 mg total) by mouth 2 (two) times daily. 4 tablet 1   No current facility-administered medications for this visit.    Medication Side Effects: None  Allergies:  Allergies  Allergen Reactions   Vancomycin Other (See Comments)    Patient states that when this medication is given intravenously, it causes her kidney's to shut down.   Neosporin  [Neomycin-Polymyxin-Gramicidin] Other (See Comments)    Patient states that this medication causes a poison ivy rash like reaction.    Past Medical History:  Diagnosis Date   Asthma    Bariatric surgery status complicating pregnancy, childbirth, or the puerperium, antepartum condition or complication 4259   HTN (hypertension)    Iron deficiency anemia    due to gastric bypass.    Mild asthma 01/10/2014   Morbid obesity with BMI of 40.0-44.9, adult ([place])    PCOS (polycystic ovarian syndrome)    PP care - s/p SVD 2/19 11/19/2011   Unspecified vitamin D deficiency    Vitamin B12 deficiency    due to gastric bypass.     Past Medical History, Surgical history, Social history, and Family history were reviewed and updated as appropriate.   Please see review of systems for further details on the patient's review from today.   Objective:   Physical Exam:  There were no vitals taken for this visit.  Physical Exam Constitutional:      General: She is not in acute distress. Musculoskeletal:        General: No deformity.  Neurological:     Mental Status: She is alert and oriented to person, place, and time.     Coordination: Coordination normal.  Psychiatric:        Attention and Perception: Attention and perception normal. She does not perceive auditory or visual hallucinations.        Mood and Affect: Mood normal. Mood is not anxious or depressed. Affect is not labile, blunt, angry or inappropriate.        Speech: Speech normal.        Behavior: Behavior normal.        Thought Content: Thought content normal. Thought content is not paranoid or delusional. Thought content does not include homicidal or suicidal ideation. Thought content does not include homicidal or suicidal plan.        Cognition and Memory: Cognition and memory normal.        Judgment: Judgment normal.     Comments: Insight intact     Lab Review:     Component Value Date/Time   NA 136 10/19/2019 1033   NA 140  01/05/2013 0939   K 4.6 10/19/2019 1033   K 4.7 01/05/2013 0939   CL 102 10/19/2019 1033   CL 106 01/05/2013 0939   CO2 30 10/19/2019 1033   CO2 27 01/05/2013 0939   GLUCOSE 100 (H) 10/19/2019 1033   GLUCOSE 75 01/05/2013 0939   BUN 7 10/19/2019 1033   BUN 11.7 01/05/2013 0939   CREATININE 0.75 10/19/2019 1033   CREATININE 0.8 01/05/2013 0939   CALCIUM 9.0 10/19/2019 1033   CALCIUM 9.4 01/05/2013 0939   PROT 6.8 10/19/2019 1033   PROT 7.5 01/05/2013 0939   ALBUMIN 4.0 10/19/2019 1033   ALBUMIN 3.8 01/05/2013 0939  AST 22 10/19/2019 1033   AST 12 01/05/2013 0939   ALT 14 10/19/2019 1033   ALT 13 01/05/2013 0939   ALKPHOS 79 10/19/2019 1033   ALKPHOS 103 01/05/2013 0939   BILITOT 0.5 10/19/2019 1033   BILITOT 0.52 01/05/2013 0939   GFRNONAA >60 08/04/2016 1146   GFRAA >60 08/04/2016 1146       Component Value Date/Time   WBC 6.8 10/19/2019 1033   RBC 4.36 10/19/2019 1033   HGB 12.5 10/19/2019 1033   HGB 13.1 09/05/2013 1552   HCT 38.9 10/19/2019 1033   HCT 40.1 09/05/2013 1552   PLT 260.0 10/19/2019 1033   PLT 257 09/05/2013 1552   MCV 89.2 10/19/2019 1033   MCV 88.7 09/05/2013 1552   MCH 22.9 (L) 08/04/2016 1146   MCHC 32.3 10/19/2019 1033   RDW 15.5 10/19/2019 1033   RDW 13.9 09/05/2013 1552   LYMPHSABS 1.4 10/19/2019 1033   LYMPHSABS 2.7 09/05/2013 1552   MONOABS 0.6 10/19/2019 1033   MONOABS 0.6 09/05/2013 1552   EOSABS 0.4 10/19/2019 1033   EOSABS 0.4 09/05/2013 1552   BASOSABS 0.0 10/19/2019 1033   BASOSABS 0.0 09/05/2013 1552    No results found for: "POCLITH", "LITHIUM"   No results found for: "PHENYTOIN", "PHENOBARB", "VALPROATE", "CBMZ"   .res Assessment: Plan:    Plan:  PDMP reviewed  Trazadone '50mg'$  at bedtime Vyvanse 40 mg every morning  Paxil 30 mg every evening at bedtime  BP 132/84/83  RTC 6 months  Patient advised to contact office with any questions, adverse effects, or acute worsening in signs and symptoms.  Discussed  potential benefits, risk, and side effects of benzodiazepines to include potential risk of tolerance and dependence, as well as possible drowsiness.  Advised patient not to drive if experiencing drowsiness and to take lowest possible effective dose to minimize risk of dependence and tolerance.  Discussed potential benefits, risks, and side effects of stimulants with patient to include increased heart rate, palpitations, insomnia, increased anxiety, increased irritability, or decreased appetite.  Instructed patient to contact office if experiencing any significant tolerability issues. Diagnoses and all orders for this visit:  Major depressive disorder, recurrent episode, in full remission ([place])  Generalized anxiety disorder  Binge eating disorder -     lisdexamfetamine (VYVANSE) 40 MG capsule; Take 1 capsule (40 mg total) by mouth daily after breakfast. -     lisdexamfetamine (VYVANSE) 40 MG capsule; Take 1 capsule (40 mg total) by mouth every morning. -     lisdexamfetamine (VYVANSE) 40 MG capsule; Take 1 capsule (40 mg total) by mouth every morning.  PCOS (polycystic ovarian syndrome)  Insomnia, unspecified type     Please see After Visit Summary for patient specific instructions.  No future appointments.   No orders of the defined types were placed in this encounter.   -------------------------------

## 2022-07-30 DIAGNOSIS — G894 Chronic pain syndrome: Secondary | ICD-10-CM | POA: Diagnosis not present

## 2022-07-30 DIAGNOSIS — M47816 Spondylosis without myelopathy or radiculopathy, lumbar region: Secondary | ICD-10-CM | POA: Diagnosis not present

## 2022-07-30 DIAGNOSIS — Z79891 Long term (current) use of opiate analgesic: Secondary | ICD-10-CM | POA: Diagnosis not present

## 2022-08-28 ENCOUNTER — Telehealth: Payer: Self-pay

## 2022-08-28 NOTE — Telephone Encounter (Addendum)
Prior Authorization Lisdexamfetamine 40 mg [employer]  Approved Effective:  08/28/2022 to 08/27/2025

## 2022-09-08 ENCOUNTER — Other Ambulatory Visit: Payer: Self-pay

## 2022-09-08 ENCOUNTER — Telehealth: Payer: Self-pay | Admitting: Adult Health

## 2022-09-08 DIAGNOSIS — F5081 Binge eating disorder: Secondary | ICD-10-CM

## 2022-09-08 MED ORDER — LISDEXAMFETAMINE DIMESYLATE 40 MG PO CAPS: 40.0000 mg | ORAL_CAPSULE | ORAL | 0 refills | Status: DC

## 2022-09-08 NOTE — Telephone Encounter (Signed)
Pended.

## 2022-09-08 NOTE — Telephone Encounter (Signed)
Patient Lvm at 11:23 stating that she has [facility] and they have been out and unsure of when they'll get some back in stock. She has found a different pharmacy and would like prescription sent to [place]

## 2022-09-22 ENCOUNTER — Other Ambulatory Visit: Payer: Self-pay | Admitting: Adult Health

## 2022-09-22 DIAGNOSIS — F411 Generalized anxiety disorder: Secondary | ICD-10-CM

## 2022-09-22 DIAGNOSIS — F3342 Major depressive disorder, recurrent, in full remission: Secondary | ICD-10-CM

## 2022-10-08 ENCOUNTER — Telehealth: Payer: Self-pay | Admitting: Adult Health

## 2022-10-08 ENCOUNTER — Other Ambulatory Visit: Payer: Self-pay

## 2022-10-08 DIAGNOSIS — F5081 Binge eating disorder: Secondary | ICD-10-CM

## 2022-10-08 MED ORDER — LISDEXAMFETAMINE DIMESYLATE 40 MG PO CAPS: 40.0000 mg | ORAL_CAPSULE | ORAL | 0 refills | Status: DC

## 2022-10-08 MED ORDER — LISDEXAMFETAMINE DIMESYLATE 40 MG PO CAPS: 40.0000 mg | ORAL_CAPSULE | Freq: Every day | ORAL | 0 refills | Status: DC

## 2022-10-08 NOTE — Telephone Encounter (Signed)
Pended.

## 2022-10-08 NOTE — Telephone Encounter (Signed)
Pt requesting Vyvanse Rx to go to [facility]. Same locations as last month. Apt 3/8

## 2022-10-27 DIAGNOSIS — M461 Sacroiliitis, not elsewhere classified: Secondary | ICD-10-CM | POA: Diagnosis not present

## 2022-10-27 DIAGNOSIS — M47816 Spondylosis without myelopathy or radiculopathy, lumbar region: Secondary | ICD-10-CM | POA: Diagnosis not present

## 2022-10-27 DIAGNOSIS — G894 Chronic pain syndrome: Secondary | ICD-10-CM | POA: Diagnosis not present

## 2022-10-27 DIAGNOSIS — F1120 Opioid dependence, uncomplicated: Secondary | ICD-10-CM | POA: Diagnosis not present

## 2022-11-12 ENCOUNTER — Other Ambulatory Visit: Payer: Self-pay

## 2022-11-12 ENCOUNTER — Telehealth: Payer: Self-pay | Admitting: Adult Health

## 2022-11-12 DIAGNOSIS — F5081 Binge eating disorder: Secondary | ICD-10-CM

## 2022-11-12 MED ORDER — LISDEXAMFETAMINE DIMESYLATE 40 MG PO CAPS: 40.0000 mg | ORAL_CAPSULE | ORAL | 0 refills | Status: DC

## 2022-11-12 NOTE — Telephone Encounter (Signed)
Meredith Castillo called at 10:53 to request that her prescription for vyvanse be sent to [facility] on [address] in [place].  She said they have the medication and in the [address].  This is the only place she has found that  has it.

## 2022-11-12 NOTE — Telephone Encounter (Signed)
Canceled Rx for February at [facility]: Meredith Castillo. Pended to [facility].

## 2022-12-05 ENCOUNTER — Ambulatory Visit (INDEPENDENT_AMBULATORY_CARE_PROVIDER_SITE_OTHER): Payer: BC Managed Care – PPO | Admitting: Adult Health

## 2022-12-05 ENCOUNTER — Encounter: Payer: Self-pay | Admitting: Adult Health

## 2022-12-05 DIAGNOSIS — F411 Generalized anxiety disorder: Secondary | ICD-10-CM

## 2022-12-05 DIAGNOSIS — F3342 Major depressive disorder, recurrent, in full remission: Secondary | ICD-10-CM

## 2022-12-05 DIAGNOSIS — F5081 Binge eating disorder: Secondary | ICD-10-CM

## 2022-12-05 DIAGNOSIS — E282 Polycystic ovarian syndrome: Secondary | ICD-10-CM | POA: Diagnosis not present

## 2022-12-05 MED ORDER — PAROXETINE HCL 30 MG PO TABS: 30.0000 mg | ORAL_TABLET | Freq: Every day | ORAL | 3 refills | Status: DC

## 2022-12-05 MED ORDER — TRAZODONE HCL 50 MG PO TABS: ORAL_TABLET | ORAL | 3 refills | Status: DC

## 2022-12-05 NOTE — Progress Notes (Signed)
Meredith Castillo [id number] 08-14-1981 42 y.o.  Subjective:   Patient ID:  Meredith Castillo is a 42 y.o. (DOB August 19, 1981) female.  Chief Complaint: No chief complaint on file.   HPI Meredith Castillo presents to the office today for follow-up of PCOS, MDD, GAD, Binge eating disorder.  Describes mood today as "ok". Pleasant. Mood symptoms - reports depression, anxiety and irritability "at times". Mood is consistent. Stating "I'm doing alright". Feels like her medication regimen works well for her. Stable interest and motivation. Taking medications as prescribed.  Energy levels stable. Active, does not have a regular exercise routine.  Enjoys some usual interests and activities. Widowed. Lives alone with 2 daughters - 38 and 21. Husband's family local and supportive. Appetite adequate. Weight stable - 280 pounds. Sleeps well most nights. Averages 6.5 to 7 hours with Trazadone. Focus and concentration stable. Completing tasks. Managing aspects of household. 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 Denies SI or HI.  Denies AH or VH. Denies self harm. Denies substance use.  Previous medication trials: Unknown   Review of Systems:  Review of Systems  Musculoskeletal:  Negative for gait problem.  Neurological:  Negative for tremors.  Psychiatric/Behavioral:         Please refer to HPI    Medications: I have reviewed the patient's current medications.  Current Outpatient Medications  Medication Sig Dispense Refill   albuterol (PROVENTIL HFA;VENTOLIN HFA) 108 (90 BASE) MCG/ACT inhaler Inhale 2 puffs into the lungs every 4 (four) hours as needed for wheezing or shortness of breath. 1 Inhaler 3   cetirizine (ZYRTEC) 10 MG tablet Take 10 mg by mouth daily.     diazepam (VALIUM) 10 MG tablet Take by mouth.     fluticasone (FLONASE) 50 MCG/ACT nasal spray Place 1 spray into both nostrils daily as needed for allergies.     folic acid (FOLVITE) 1 MG tablet Take 1 mg by mouth daily.      gabapentin (NEURONTIN) 300 MG capsule TAKE 1 CAPSULE BY MOUTH THREE TIMES A DAY AS DIRECTED (INCREASE IN FREQUENCY)     HYDROcodone-acetaminophen (NORCO) 10-325 MG tablet TAKE 1 TABLET BY MOUTH EVERY 4 HOURS WHILE AWAKE AS NEEDED FOR PAIN     ibuprofen (ADVIL,MOTRIN) 200 MG tablet Take 400-800 mg by mouth every 8 (eight) hours as needed for headache or moderate pain.      levonorgestrel (MIRENA) 20 MCG/24HR IUD 1 each by Intrauterine route once.     lisdexamfetamine (VYVANSE) 40 MG capsule Take 1 capsule (40 mg total) by mouth every morning. 30 capsule 0   lisdexamfetamine (VYVANSE) 40 MG capsule Take 1 capsule (40 mg total) by mouth daily after breakfast. 30 capsule 0   lisdexamfetamine (VYVANSE) 40 MG capsule Take 1 capsule (40 mg total) by mouth every morning. 30 capsule 0   Multiple Vitamins-Minerals (MULTIVITAMIN WITH MINERALS) tablet Take 1 tablet by mouth daily.     mupirocin ointment (BACTROBAN) 2 % Place 1 application into the nose as needed (for wound care). 22 g 1   PARoxetine (PAXIL) 30 MG tablet Take 1 tablet (30 mg total) by mouth daily at 10 pm. 90 tablet 3   pregabalin (LYRICA) 100 MG capsule Take 100 mg by mouth 3 (three) times daily.     promethazine (PHENERGAN) 25 MG tablet TAKE 1 TABLET BY MOUTH EVERY 6 HOURS AS NEEDED FOR NAUSEA AND VOMITING 30 tablet 2   rivaroxaban (XARELTO) 20 MG TABS tablet Take 1 tablet (20 mg total)  by mouth daily with supper. 30 tablet 2   traZODone (DESYREL) 50 MG tablet TAKE 1 TABLET BY MOUTH EVERYDAY AT BEDTIME 90 tablet 3   valACYclovir (VALTREX) 1000 MG tablet Take 2 tablets (2,000 mg total) by mouth 2 (two) times daily. 4 tablet 1   No current facility-administered medications for this visit.    Medication Side Effects: None  Allergies:  Allergies  Allergen Reactions   Vancomycin Other (See Comments)    Patient states that when this medication is given intravenously, it causes her kidney's to shut down.   Neosporin  [Neomycin-Polymyxin-Gramicidin] Other (See Comments)    Patient states that this medication causes a poison ivy rash like reaction.    Past Medical History:  Diagnosis Date   Asthma    Bariatric surgery status complicating pregnancy, childbirth, or the puerperium, antepartum condition or complication [id number]   HTN (hypertension)    Iron deficiency anemia    due to gastric bypass.    Mild asthma 01/10/2014   Morbid obesity with BMI of 40.0-44.9, adult ([place])    PCOS (polycystic ovarian syndrome)    PP care - s/p SVD 2/19 11/19/2011   Unspecified vitamin D deficiency    Vitamin B12 deficiency    due to gastric bypass.     Past Medical History, Surgical history, Social history, and Family history were reviewed and updated as appropriate.   Please see review of systems for further details on the patient's review from today.   Objective:   Physical Exam:  There were no vitals taken for this visit.  Physical Exam Constitutional:      General: She is not in acute distress. Musculoskeletal:        General: No deformity.  Neurological:     Mental Status: She is alert and oriented to person, place, and time.     Coordination: Coordination normal.  Psychiatric:        Attention and Perception: Attention and perception normal. She does not perceive auditory or visual hallucinations.        Mood and Affect: Mood normal. Mood is not anxious or depressed. Affect is not labile, blunt, angry or inappropriate.        Speech: Speech normal.        Behavior: Behavior normal.        Thought Content: Thought content normal. Thought content is not paranoid or delusional. Thought content does not include homicidal or suicidal ideation. Thought content does not include homicidal or suicidal plan.        Cognition and Memory: Cognition and memory normal.        Judgment: Judgment normal.     Comments: Insight intact     Lab Review:     Component Value Date/Time   NA 136 10/19/2019 1033   NA 140  01/05/2013 0939   K 4.6 10/19/2019 1033   K 4.7 01/05/2013 0939   CL 102 10/19/2019 1033   CL 106 01/05/2013 0939   CO2 30 10/19/2019 1033   CO2 27 01/05/2013 0939   GLUCOSE 100 (H) 10/19/2019 1033   GLUCOSE 75 01/05/2013 0939   BUN 7 10/19/2019 1033   BUN 11.7 01/05/2013 0939   CREATININE 0.75 10/19/2019 1033   CREATININE 0.8 01/05/2013 0939   CALCIUM 9.0 10/19/2019 1033   CALCIUM 9.4 01/05/2013 0939   PROT 6.8 10/19/2019 1033   PROT 7.5 01/05/2013 0939   ALBUMIN 4.0 10/19/2019 1033   ALBUMIN 3.8 01/05/2013 0939   AST 22 10/19/2019 1033  AST 12 01/05/2013 0939   ALT 14 10/19/2019 1033   ALT 13 01/05/2013 0939   ALKPHOS 79 10/19/2019 1033   ALKPHOS 103 01/05/2013 0939   BILITOT 0.5 10/19/2019 1033   BILITOT 0.52 01/05/2013 0939   GFRNONAA >60 08/04/2016 1146   GFRAA >60 08/04/2016 1146       Component Value Date/Time   WBC 6.8 10/19/2019 1033   RBC 4.36 10/19/2019 1033   HGB 12.5 10/19/2019 1033   HGB 13.1 09/05/2013 1552   HCT 38.9 10/19/2019 1033   HCT 40.1 09/05/2013 1552   PLT 260.0 10/19/2019 1033   PLT 257 09/05/2013 1552   MCV 89.2 10/19/2019 1033   MCV 88.7 09/05/2013 1552   MCH 22.9 (L) 08/04/2016 1146   MCHC 32.3 10/19/2019 1033   RDW 15.5 10/19/2019 1033   RDW 13.9 09/05/2013 1552   LYMPHSABS 1.4 10/19/2019 1033   LYMPHSABS 2.7 09/05/2013 1552   MONOABS 0.6 10/19/2019 1033   MONOABS 0.6 09/05/2013 1552   EOSABS 0.4 10/19/2019 1033   EOSABS 0.4 09/05/2013 1552   BASOSABS 0.0 10/19/2019 1033   BASOSABS 0.0 09/05/2013 1552    No results found for: "POCLITH", "LITHIUM"   No results found for: "PHENYTOIN", "PHENOBARB", "VALPROATE", "CBMZ"   .res Assessment: Plan:    Plan:  PDMP reviewed  Trazadone '50mg'$  at bedtime Vyvanse 40 mg every morning  Paxil 30 mg every evening at bedtime  Monitor BP between visits while taking stimulant medication.   RTC 6 months  Patient advised to contact office with any questions, adverse effects, or acute  worsening in signs and symptoms.  Discussed potential benefits, risk, and side effects of benzodiazepines to include potential risk of tolerance and dependence, as well as possible drowsiness.  Advised patient not to drive if experiencing drowsiness and to take lowest possible effective dose to minimize risk of dependence and tolerance.  Discussed potential benefits, risks, and side effects of stimulants with patient to include increased heart rate, palpitations, insomnia, increased anxiety, increased irritability, or decreased appetite.  Instructed patient to contact office if experiencing any significant tolerability issues. Diagnoses and all orders for this visit:  Major depressive disorder, recurrent episode, in full remission ([place]) -     traZODone (DESYREL) 50 MG tablet; TAKE 1 TABLET BY MOUTH EVERYDAY AT BEDTIME -     PARoxetine (PAXIL) 30 MG tablet; Take 1 tablet (30 mg total) by mouth daily at 10 pm.  Generalized anxiety disorder -     traZODone (DESYREL) 50 MG tablet; TAKE 1 TABLET BY MOUTH EVERYDAY AT BEDTIME -     PARoxetine (PAXIL) 30 MG tablet; Take 1 tablet (30 mg total) by mouth daily at 10 pm.  Binge eating disorder     Please see After Visit Summary for patient specific instructions.  No future appointments.   No orders of the defined types were placed in this encounter.   -------------------------------

## 2022-12-15 ENCOUNTER — Other Ambulatory Visit: Payer: Self-pay | Admitting: Adult Health

## 2022-12-15 ENCOUNTER — Telehealth: Payer: Self-pay | Admitting: Adult Health

## 2022-12-15 DIAGNOSIS — F5081 Binge eating disorder: Secondary | ICD-10-CM

## 2022-12-15 MED ORDER — VYVANSE 40 MG PO CAPS: 40.0000 mg | ORAL_CAPSULE | ORAL | 0 refills | Status: DC

## 2022-12-15 NOTE — Telephone Encounter (Signed)
Script sent  

## 2022-12-15 NOTE — Telephone Encounter (Signed)
Pt called requesting Brand Vyvanse Rx to [facility] [place]. She can't find generic and she knows it will cost $135. Pt ok with that. Contact pt @ [phone] if needed. Apt 9/9

## 2022-12-19 ENCOUNTER — Ambulatory Visit (INDEPENDENT_AMBULATORY_CARE_PROVIDER_SITE_OTHER): Payer: BC Managed Care – PPO | Admitting: Family Medicine

## 2022-12-19 VITALS — BP 140/98 | HR 74 | Temp 97.6°F | Ht 67.5 in | Wt 311.1 lb

## 2022-12-19 DIAGNOSIS — Z20818 Contact with and (suspected) exposure to other bacterial communicable diseases: Secondary | ICD-10-CM | POA: Diagnosis not present

## 2022-12-19 DIAGNOSIS — B009 Herpesviral infection, unspecified: Secondary | ICD-10-CM | POA: Diagnosis not present

## 2022-12-19 DIAGNOSIS — J029 Acute pharyngitis, unspecified: Secondary | ICD-10-CM

## 2022-12-19 LAB — POCT RAPID STREP A (OFFICE): Rapid Strep A Screen: NEGATIVE

## 2022-12-19 MED ORDER — VALACYCLOVIR HCL 1 G PO TABS: 2000.0000 mg | ORAL_TABLET | Freq: Two times a day (BID) | ORAL | 0 refills | Status: DC

## 2022-12-19 MED ORDER — AMOXICILLIN 500 MG PO CAPS: 1000.0000 mg | ORAL_CAPSULE | Freq: Two times a day (BID) | ORAL | 0 refills | Status: DC

## 2022-12-19 NOTE — Progress Notes (Signed)
Patient ID: Meredith Castillo, female    DOB: 09/02/1981, 42 y.o.   MRN: [id number]  This visit was conducted in person.  BP (!) 140/98   Pulse 74   Temp 97.6 F (36.4 C) (Temporal)   Ht 5' 7.5" (1.715 m)   Wt (!) 311 lb 2 oz (141.1 kg)   SpO2 100%   BMI 48.01 kg/m    CC:  Chief Complaint  Patient presents with   Sore Throat    Daughter tested positive for strep this morning Patient took one dose of Augmentin last night she had left over   Headache    Subjective:   HPI: Meredith Castillo is a 42 y.o. female presenting on 12/19/2022 for Sore Throat (Daughter tested positive for strep this morning/Patient took one dose of Augmentin last night she had left over) and Headache   Date of onset:  5 days Initial symptoms included  ST and headache Symptoms progressed to gradually worsening  Some nausea. No fever, no SOB    Has symptoms of fever blister break out next to right nostril... needs refill of valacyclovir.    Sick contacts:  daughter with strep Dx this AM. COVID testing:   none     She has tried to treat with  leftover Augmentin     No history of chronic lung disease such as asthma or COPD. Non-smoker.       Relevant past medical, surgical, family and social history reviewed and updated as indicated. Interim medical history since our last visit reviewed. Allergies and medications reviewed and updated. Outpatient Medications Prior to Visit  Medication Sig Dispense Refill   albuterol (PROVENTIL HFA;VENTOLIN HFA) 108 (90 BASE) MCG/ACT inhaler Inhale 2 puffs into the lungs every 4 (four) hours as needed for wheezing or shortness of breath. 1 Inhaler 3   buprenorphine (BUTRANS) 7.5 MCG/HR Place 1 patch onto the skin once a week.     cetirizine (ZYRTEC) 10 MG tablet Take 10 mg by mouth daily.     fluticasone (FLONASE) 50 MCG/ACT nasal spray Place 1 spray into both nostrils daily as needed for allergies.     folic acid (FOLVITE) 1 MG tablet Take 1 mg by mouth daily.      gabapentin (NEURONTIN) 300 MG capsule TAKE 1 CAPSULE BY MOUTH THREE TIMES A DAY AS DIRECTED (INCREASE IN FREQUENCY)     ibuprofen (ADVIL,MOTRIN) 200 MG tablet Take 400-800 mg by mouth every 8 (eight) hours as needed for headache or moderate pain.      levonorgestrel (MIRENA) 20 MCG/24HR IUD 1 each by Intrauterine route once.     lisdexamfetamine (VYVANSE) 40 MG capsule Take 1 capsule (40 mg total) by mouth every morning. 30 capsule 0   Multiple Vitamins-Minerals (MULTIVITAMIN WITH MINERALS) tablet Take 1 tablet by mouth daily.     mupirocin ointment (BACTROBAN) 2 % Place 1 application into the nose as needed (for wound care). 22 g 1   PARoxetine (PAXIL) 30 MG tablet Take 1 tablet (30 mg total) by mouth daily at 10 pm. 90 tablet 3   promethazine (PHENERGAN) 25 MG tablet TAKE 1 TABLET BY MOUTH EVERY 6 HOURS AS NEEDED FOR NAUSEA AND VOMITING 30 tablet 2   traZODone (DESYREL) 50 MG tablet TAKE 1 TABLET BY MOUTH EVERYDAY AT BEDTIME 90 tablet 3   valACYclovir (VALTREX) 1000 MG tablet Take 2 tablets (2,000 mg total) by mouth 2 (two) times daily. 4 tablet 1   VYVANSE 40 MG capsule Take 1  capsule (40 mg total) by mouth every morning. 30 capsule 0   diazepam (VALIUM) 10 MG tablet Take by mouth.     HYDROcodone-acetaminophen (NORCO) 10-325 MG tablet TAKE 1 TABLET BY MOUTH EVERY 4 HOURS WHILE AWAKE AS NEEDED FOR PAIN     lisdexamfetamine (VYVANSE) 40 MG capsule Take 1 capsule (40 mg total) by mouth daily after breakfast. 30 capsule 0   pregabalin (LYRICA) 100 MG capsule Take 100 mg by mouth 3 (three) times daily.     rivaroxaban (XARELTO) 20 MG TABS tablet Take 1 tablet (20 mg total) by mouth daily with supper. 30 tablet 2   No facility-administered medications prior to visit.     Per HPI unless specifically indicated in ROS section below Review of Systems  Constitutional:  Positive for fatigue. Negative for fever.  HENT:  Positive for sore throat. Negative for congestion.   Eyes:  Negative for pain.   Respiratory:  Negative for cough and shortness of breath.   Cardiovascular:  Negative for chest pain, palpitations and leg swelling.  Gastrointestinal:  Positive for nausea. Negative for abdominal pain.  Genitourinary:  Negative for dysuria and vaginal bleeding.  Musculoskeletal:  Negative for back pain.  Neurological:  Negative for syncope, light-headedness and headaches.  Psychiatric/Behavioral:  Negative for dysphoric mood.    Objective:  BP (!) 140/98   Pulse 74   Temp 97.6 F (36.4 C) (Temporal)   Ht 5' 7.5" (1.715 m)   Wt (!) 311 lb 2 oz (141.1 kg)   SpO2 100%   BMI 48.01 kg/m   Wt Readings from Last 3 Encounters:  12/19/22 (!) 311 lb 2 oz (141.1 kg)  12/09/21 (!) 304 lb 9.6 oz (138.2 kg)  12/20/20 297 lb 8 oz (134.9 kg)      Physical Exam Constitutional:      General: She is not in acute distress.    Appearance: Normal appearance. She is well-developed. She is not ill-appearing or toxic-appearing.  HENT:     Head: Normocephalic.     Right Ear: Hearing, tympanic membrane, ear canal and external ear normal. Tympanic membrane is not erythematous, retracted or bulging.     Left Ear: Hearing, tympanic membrane, ear canal and external ear normal. Tympanic membrane is not erythematous, retracted or bulging.     Nose: No mucosal edema or rhinorrhea.     Right Sinus: No maxillary sinus tenderness or frontal sinus tenderness.     Left Sinus: No maxillary sinus tenderness or frontal sinus tenderness.     Mouth/Throat:     Pharynx: Uvula midline.  Eyes:     General: Lids are normal. Lids are everted, no foreign bodies appreciated.     Conjunctiva/sclera: Conjunctivae normal.     Pupils: Pupils are equal, round, and reactive to light.  Neck:     Thyroid: No thyroid mass or thyromegaly.     Vascular: No carotid bruit.     Trachea: Trachea normal.  Cardiovascular:     Rate and Rhythm: Normal rate and regular rhythm.     Pulses: Normal pulses.     Heart sounds: Normal heart  sounds, S1 normal and S2 normal. No murmur heard.    No friction rub. No gallop.  Pulmonary:     Effort: Pulmonary effort is normal. No tachypnea or respiratory distress.     Breath sounds: Normal breath sounds. No decreased breath sounds, wheezing, rhonchi or rales.  Abdominal:     General: Bowel sounds are normal.  Palpations: Abdomen is soft.     Tenderness: There is no abdominal tenderness.  Musculoskeletal:     Cervical back: Normal range of motion and neck supple.  Skin:    General: Skin is warm and dry.     Findings: No rash.  Neurological:     Mental Status: She is alert.  Psychiatric:        Mood and Affect: Mood is not anxious or depressed.        Speech: Speech normal.        Behavior: Behavior normal. Behavior is cooperative.        Thought Content: Thought content normal.        Judgment: Judgment normal.       Results for orders placed or performed in visit on 10/19/19  WOUND CULTURE   Specimen: Blood  Result Value Ref Range   MICRO NUMBER: [id number]    SPECIMEN QUALITY: Adequate    SOURCE: WOUND (SITE NOT SPECIFIED)    STATUS: FINAL    GRAM STAIN: Few Polymorphonuclear leukocytes No organisms seen    ISOLATE 1: Escherichia coli (A)       Susceptibility   Escherichia coli - AEROBIC CULT, GRAM STAIN NEGATIVE 1    AMOX/CLAVULANIC 4 Sensitive     AMPICILLIN 4 Sensitive     AMPICILLIN/SULBACTAM <=2 Sensitive     CEFAZOLIN* <=4 Not Reportable      * For infections other than uncomplicated UTIcaused by E. coli, K. pneumoniae or P. mirabilis:Cefazolin is resistant if MIC > or = 8 mcg/mL.(Distinguishing susceptible versus intermediatefor isolates with MIC < or = 4 mcg/mL requiresadditional testing.)    CEFEPIME <=1 Sensitive     CEFTRIAXONE <=1 Sensitive     CIPROFLOXACIN <=0.25 Sensitive     LEVOFLOXACIN <=0.12 Sensitive     ERTAPENEM <=0.5 Sensitive     GENTAMICIN <=1 Sensitive     IMIPENEM <=0.25 Sensitive     PIP/TAZO <=4 Sensitive     TOBRAMYCIN <=1  Sensitive     TRIMETH/SULFA* <=20 Sensitive      * For infections other than uncomplicated UTIcaused by E. coli, K. pneumoniae or P. mirabilis:Cefazolin is resistant if MIC > or = 8 mcg/mL.(Distinguishing susceptible versus intermediatefor isolates with MIC < or = 4 mcg/mL requiresadditional testing.)Legend:S = Susceptible  I = IntermediateR = Resistant  NS = Not susceptible* = Not tested  NR = Not reported**NN = See antimicrobic comments  Basic metabolic panel  Result Value Ref Range   Sodium 136 135 - 145 mEq/L   Potassium 4.6 3.5 - 5.1 mEq/L   Chloride 102 96 - 112 mEq/L   CO2 30 19 - 32 mEq/L   Glucose, Bld 100 (H) 70 - 99 mg/dL   BUN 7 6 - 23 mg/dL   Creatinine, Ser 0.75 0.40 - 1.20 mg/dL   GFR 85.14 >60.00 mL/min   Calcium 9.0 8.4 - 10.5 mg/dL  CBC with Differential/Platelet  Result Value Ref Range   WBC 6.8 4.0 - 10.5 K/uL   RBC 4.36 3.87 - 5.11 Mil/uL   Hemoglobin 12.5 12.0 - 15.0 g/dL   HCT 38.9 36.0 - 46.0 %   MCV 89.2 78.0 - 100.0 fl   MCHC 32.3 30.0 - 36.0 g/dL   RDW 15.5 11.5 - 15.5 %   Platelets 260.0 150.0 - 400.0 K/uL   Neutrophils Relative % 63.6 43.0 - 77.0 %   Lymphocytes Relative 21.2 12.0 - 46.0 %   Monocytes Relative 8.8 3.0 - 12.0 %  Eosinophils Relative 6.0 (H) 0.0 - 5.0 %   Basophils Relative 0.4 0.0 - 3.0 %   Neutro Abs 4.3 1.4 - 7.7 K/uL   Lymphs Abs 1.4 0.7 - 4.0 K/uL   Monocytes Absolute 0.6 0.1 - 1.0 K/uL   Eosinophils Absolute 0.4 0.0 - 0.7 K/uL   Basophils Absolute 0.0 0.0 - 0.1 K/uL  Hepatic function panel  Result Value Ref Range   Total Bilirubin 0.5 0.2 - 1.2 mg/dL   Bilirubin, Direct 0.1 0.0 - 0.3 mg/dL   Alkaline Phosphatase 79 39 - 117 U/L   AST 22 0 - 37 U/L   ALT 14 0 - 35 U/L   Total Protein 6.8 6.0 - 8.3 g/dL   Albumin 4.0 3.5 - 5.2 g/dL  Brain natriuretic peptide  Result Value Ref Range   Pro B Natriuretic peptide (BNP) 24.0 0.0 - 100.0 pg/mL    Assessment and Plan  Sore throat Assessment & Plan: Acute, given exposure to  strep throat, likely partially treated with Augmentin resulting in negative rapid strep test.  Will go ahead and treat with amoxicillin 500 mg 2 capsules twice daily x 10 days.  Tylenol or ibuprofen as needed for pain.  Return and ER precautions provided.  Orders: -     POCT rapid strep A  Exposure to strep throat  Herpes simplex type 1 infection Assessment & Plan: Acute, noting initial symptoms of outbreak.  Refilled valacyclovir 2 g twice daily x 1 day, given her #20 given outbreaks throughout year.   Other orders -     valACYclovir HCl; Take 2 tablets (2,000 mg total) by mouth 2 (two) times daily.  Dispense: 20 tablet; Refill: 0 -     Amoxicillin; Take 2 capsules (1,000 mg total) by mouth 2 (two) times daily.  Dispense: 40 capsule; Refill: 0    No follow-ups on file.   Eliezer Lofts, MD

## 2022-12-19 NOTE — Patient Instructions (Signed)
Tylenol or ibuprofen for sore throat pain.  Complete antibiotics: Amoxicillin 500 mg 2 capsules twice daily x 10 days. Call if fever on antibiotics, not tolerating antibiotics or sore throat not improving as expected. Go to ER if unable to follow-up or shortness of breath.

## 2022-12-19 NOTE — Assessment & Plan Note (Signed)
Acute, noting initial symptoms of outbreak.  Refilled valacyclovir 2 g twice daily x 1 day, given her #20 given outbreaks throughout year.

## 2022-12-19 NOTE — Assessment & Plan Note (Signed)
Acute, given exposure to strep throat, likely partially treated with Augmentin resulting in negative rapid strep test.  Will go ahead and treat with amoxicillin 500 mg 2 capsules twice daily x 10 days.  Tylenol or ibuprofen as needed for pain.  Return and ER precautions provided.

## 2022-12-30 ENCOUNTER — Ambulatory Visit: Payer: BC Managed Care – PPO | Admitting: Adult Health

## 2023-01-01 ENCOUNTER — Ambulatory Visit: Payer: BC Managed Care – PPO | Admitting: Adult Health

## 2023-01-21 ENCOUNTER — Telehealth: Payer: Self-pay | Admitting: Adult Health

## 2023-01-21 NOTE — Telephone Encounter (Signed)
Pt called and said that she has not been able to find the vyvanse brand or generic anywhere. So She would like to know if she could go on concerta instead. [facility] [place]. Please call her at [phone]

## 2023-01-21 NOTE — Telephone Encounter (Signed)
Need to discuss the change before sending in a different medication

## 2023-01-22 NOTE — Telephone Encounter (Signed)
Please call patient to schedule an earlier appt to discuss her request to switch from Vyvanse to a different stimulant.

## 2023-01-22 NOTE — Telephone Encounter (Signed)
Has she tried the hospital pharmacies?

## 2023-01-22 NOTE — Telephone Encounter (Signed)
Pt is scheduled for Monday 4/29

## 2023-01-23 ENCOUNTER — Other Ambulatory Visit: Payer: Self-pay

## 2023-01-23 DIAGNOSIS — F5081 Binge eating disorder: Secondary | ICD-10-CM

## 2023-01-23 MED ORDER — LISDEXAMFETAMINE DIMESYLATE 40 MG PO CAPS: 40.0000 mg | ORAL_CAPSULE | ORAL | 0 refills | Status: DC

## 2023-01-23 NOTE — Telephone Encounter (Signed)
Rx pended on [facility] on [address]. VM full.

## 2023-01-26 ENCOUNTER — Encounter: Payer: Self-pay | Admitting: Adult Health

## 2023-01-26 ENCOUNTER — Ambulatory Visit (INDEPENDENT_AMBULATORY_CARE_PROVIDER_SITE_OTHER): Payer: BC Managed Care – PPO | Admitting: Adult Health

## 2023-01-26 DIAGNOSIS — F908 Attention-deficit hyperactivity disorder, other type: Secondary | ICD-10-CM | POA: Diagnosis not present

## 2023-01-26 DIAGNOSIS — G4700 Insomnia, unspecified: Secondary | ICD-10-CM | POA: Diagnosis not present

## 2023-01-26 DIAGNOSIS — F411 Generalized anxiety disorder: Secondary | ICD-10-CM

## 2023-01-26 DIAGNOSIS — F3342 Major depressive disorder, recurrent, in full remission: Secondary | ICD-10-CM

## 2023-01-26 DIAGNOSIS — E282 Polycystic ovarian syndrome: Secondary | ICD-10-CM

## 2023-01-26 MED ORDER — METHYLPHENIDATE HCL ER (OSM) 36 MG PO TBCR: 36.0000 mg | EXTENDED_RELEASE_TABLET | Freq: Every day | ORAL | 0 refills | Status: DC

## 2023-01-26 NOTE — Progress Notes (Signed)
Meredith Castillo [id number] 08-Oct-1980 42 y.o.  Subjective:   Patient ID:  Meredith Castillo is a 42 y.o. (DOB [birth year]) female.  Chief Complaint: No chief complaint on file.   HPI Meredith Castillo presents to the office today for follow-up of PCOS, MDD, GAD, ADD  Describes mood today as "ok". Pleasant. Mood symptoms - reports denies depression, anxiety and irritability "not especially". Reports some worry - upcoming changes. Denies rumination and over thinking. Mood is consistent. Stating "I feel like my mood is ok". Feels like her medication regimen works well for her. Stable interest and motivation. Taking medications as prescribed.  Energy levels stable. Active, does not have a regular exercise routine.  Enjoys some usual interests and activities. Widowed. Lives alone with 2 daughters - 19 and 83. Husband's family local and supportive. Appetite adequate. Weight stable - 280 pounds. Sleeps well most nights. Averages 6.5 to 7 hours with Trazadone. Focus and concentration difficulties without Vyvanse. Completing tasks. Managing aspects of household. 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 Denies SI or HI.  Denies AH or VH. Denies self harm. Denies substance use.  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  Neurological:  Negative for tremors.  Psychiatric/Behavioral:         Please refer to HPI    Medications: I have reviewed the patient's current medications.  Current Outpatient Medications  Medication Sig Dispense Refill   albuterol (PROVENTIL HFA;VENTOLIN HFA) 108 (90 BASE) MCG/ACT inhaler Inhale 2 puffs into the lungs every 4 (four) hours as needed for wheezing or shortness of breath. 1 Inhaler 3   amoxicillin (AMOXIL)  500 MG capsule Take 2 capsules (1,000 mg total) by mouth 2 (two) times daily. 40 capsule 0   buprenorphine (BUTRANS) 7.5 MCG/HR Place 1 patch onto the skin once a week.     cetirizine (ZYRTEC) 10 MG tablet Take 10 mg by mouth daily.     fluticasone (FLONASE) 50 MCG/ACT nasal spray Place 1 spray into both nostrils daily as needed for allergies.     folic acid (FOLVITE) 1 MG tablet Take 1 mg by mouth daily.     gabapentin (NEURONTIN) 300 MG capsule TAKE 1 CAPSULE BY MOUTH THREE TIMES A DAY AS DIRECTED (INCREASE IN FREQUENCY)     ibuprofen (ADVIL,MOTRIN) 200 MG tablet Take 400-800 mg by mouth every 8 (eight) hours as needed for headache or moderate pain.      levonorgestrel (MIRENA) 20 MCG/24HR IUD 1 each by Intrauterine route once.     lisdexamfetamine (VYVANSE) 40 MG capsule Take 1 capsule (40 mg total) by mouth every morning. 30 capsule 0   Multiple Vitamins-Minerals (MULTIVITAMIN WITH MINERALS) tablet Take 1 tablet by mouth daily.     mupirocin ointment (BACTROBAN) 2 % Place 1 application into the nose as needed (for wound care). 22 g 1   PARoxetine (PAXIL) 30 MG tablet Take 1 tablet (30 mg total) by mouth daily at 10 pm. 90 tablet 3   promethazine (PHENERGAN) 25 MG tablet TAKE 1 TABLET BY MOUTH EVERY 6 HOURS AS NEEDED FOR NAUSEA AND VOMITING 30 tablet 2   traZODone (DESYREL) 50 MG tablet TAKE 1 TABLET BY MOUTH EVERYDAY AT  BEDTIME 90 tablet 3   valACYclovir (VALTREX) 1000 MG tablet Take 2 tablets (2,000 mg total) by mouth 2 (two) times daily. 20 tablet 0   No current facility-administered medications for this visit.    Medication Side Effects: None  Allergies:  Allergies  Allergen Reactions   Vancomycin Other (See Comments)    Patient states that when this medication is given intravenously, it causes her kidney's to shut down.   Neosporin [Neomycin-Polymyxin-Gramicidin] Other (See Comments)    Patient states that this medication causes a poison ivy rash like reaction.    Past Medical  History:  Diagnosis Date   Asthma    Bariatric surgery status complicating pregnancy, childbirth, or the puerperium, antepartum condition or complication 2008   HTN (hypertension)    Iron deficiency anemia    due to gastric bypass.    Mild asthma 01/10/2014   Morbid obesity with BMI of 40.0-44.9, adult (HCC)    PCOS (polycystic ovarian syndrome)    PP care - s/p SVD 2/19 11/19/2011   Unspecified vitamin D deficiency    Vitamin B12 deficiency    due to gastric bypass.     Past Medical History, Surgical history, Social history, and Family history were reviewed and updated as appropriate.   Please see review of systems for further details on the patient's review from today.   Objective:   Physical Exam:  There were no vitals taken for this visit.  Physical Exam Constitutional:      General: She is not in acute distress. Musculoskeletal:        General: No deformity.  Neurological:     Mental Status: She is alert and oriented to person, place, and time.     Coordination: Coordination normal.  Psychiatric:        Attention and Perception: Attention and perception normal. She does not perceive auditory or visual hallucinations.        Mood and Affect: Mood normal. Mood is not anxious or depressed. Affect is not labile, blunt, angry or inappropriate.        Speech: Speech normal.        Behavior: Behavior normal.        Thought Content: Thought content normal. Thought content is not paranoid or delusional. Thought content does not include homicidal or suicidal ideation. Thought content does not include homicidal or suicidal plan.        Cognition and Memory: Cognition and memory normal.        Judgment: Judgment normal.     Comments: Insight intact     Lab Review:     Component Value Date/Time   NA 136 10/19/2019 1033   NA 140 01/05/2013 0939   K 4.6 10/19/2019 1033   K 4.7 01/05/2013 0939   CL 102 10/19/2019 1033   CL 106 01/05/2013 0939   CO2 30 10/19/2019 1033   CO2  27 01/05/2013 0939   GLUCOSE 100 (H) 10/19/2019 1033   GLUCOSE 75 01/05/2013 0939   BUN 7 10/19/2019 1033   BUN 11.7 01/05/2013 0939   CREATININE 0.75 10/19/2019 1033   CREATININE 0.8 01/05/2013 0939   CALCIUM 9.0 10/19/2019 1033   CALCIUM 9.4 01/05/2013 0939   PROT 6.8 10/19/2019 1033   PROT 7.5 01/05/2013 0939   ALBUMIN 4.0 10/19/2019 1033   ALBUMIN 3.8 01/05/2013 0939   AST 22 10/19/2019 1033   AST 12 01/05/2013 0939   ALT 14 10/19/2019 1033   ALT 13 01/05/2013 0939   ALKPHOS 79  10/19/2019 1033   ALKPHOS 103 01/05/2013 0939   BILITOT 0.5 10/19/2019 1033   BILITOT 0.52 01/05/2013 0939   GFRNONAA >60 08/04/2016 1146   GFRAA >60 08/04/2016 1146       Component Value Date/Time   WBC 6.8 10/19/2019 1033   RBC 4.36 10/19/2019 1033   HGB 12.5 10/19/2019 1033   HGB 13.1 09/05/2013 1552   HCT 38.9 10/19/2019 1033   HCT 40.1 09/05/2013 1552   PLT 260.0 10/19/2019 1033   PLT 257 09/05/2013 1552   MCV 89.2 10/19/2019 1033   MCV 88.7 09/05/2013 1552   MCH 22.9 (L) 08/04/2016 1146   MCHC 32.3 10/19/2019 1033   RDW 15.5 10/19/2019 1033   RDW 13.9 09/05/2013 1552   LYMPHSABS 1.4 10/19/2019 1033   LYMPHSABS 2.7 09/05/2013 1552   MONOABS 0.6 10/19/2019 1033   MONOABS 0.6 09/05/2013 1552   EOSABS 0.4 10/19/2019 1033   EOSABS 0.4 09/05/2013 1552   BASOSABS 0.0 10/19/2019 1033   BASOSABS 0.0 09/05/2013 1552    No results found for: "POCLITH", "LITHIUM"   No results found for: "PHENYTOIN", "PHENOBARB", "VALPROATE", "CBMZ"   .res Assessment: Plan:    Plan:  PDMP reviewed  Trazadone 50mg  at bedtime Paxil 30 mg every evening at bedtime  D/C Vyvanse 40 mg every morning - will call in can find in stock Add Concerta 54mg  every morning - using for now until vyvanse is available  Monitor BP between visits while taking stimulant medication.   RTC 6 months  Patient advised to contact office with any questions, adverse effects, or acute worsening in signs and  symptoms.  Discussed potential benefits, risk, and side effects of benzodiazepines to include potential risk of tolerance and dependence, as well as possible drowsiness.  Advised patient not to drive if experiencing drowsiness and to take lowest possible effective dose to minimize risk of dependence and tolerance.  Discussed potential benefits, risks, and side effects of stimulants with patient to include increased heart rate, palpitations, insomnia, increased anxiety, increased irritability, or decreased appetite.  Instructed patient to contact office if experiencing any significant tolerability issues. There are no diagnoses linked to this encounter.   Please see After Visit Summary for patient specific instructions.  Future Appointments  Date Time Provider Department Center  01/26/2023  4:40 PM Arles Rumbold, Thereasa Solo, NP CP-CP None  06/08/2023  4:20 PM Triton Heidrich, Thereasa Solo, NP [facility] None    No orders of the defined types were placed in this encounter.   -------------------------------

## 2023-01-27 ENCOUNTER — Telehealth: Payer: Self-pay | Admitting: Adult Health

## 2023-01-27 DIAGNOSIS — Z79891 Long term (current) use of opiate analgesic: Secondary | ICD-10-CM | POA: Diagnosis not present

## 2023-01-27 DIAGNOSIS — M47816 Spondylosis without myelopathy or radiculopathy, lumbar region: Secondary | ICD-10-CM | POA: Diagnosis not present

## 2023-01-27 DIAGNOSIS — G894 Chronic pain syndrome: Secondary | ICD-10-CM | POA: Diagnosis not present

## 2023-01-27 NOTE — Telephone Encounter (Signed)
[facility] sent [facility] request for CONCERTA 36mg  ER, See CMM

## 2023-02-01 NOTE — Telephone Encounter (Signed)
Prior Authorization Methylphenidate HCl ER TBCR 36MG  tablets #30/30 [employer]  Approved  02/01/2023 to 01/31/2026

## 2023-02-06 DIAGNOSIS — M461 Sacroiliitis, not elsewhere classified: Secondary | ICD-10-CM | POA: Diagnosis not present

## 2023-02-10 ENCOUNTER — Other Ambulatory Visit: Payer: Self-pay | Admitting: Dermatology

## 2023-02-10 ENCOUNTER — Other Ambulatory Visit: Payer: Self-pay | Admitting: Family Medicine

## 2023-02-10 DIAGNOSIS — D485 Neoplasm of uncertain behavior of skin: Secondary | ICD-10-CM

## 2023-02-10 MED ORDER — MUPIROCIN 2 % EX OINT: 1.0000 | TOPICAL_OINTMENT | CUTANEOUS | 1 refills | Status: AC | PRN

## 2023-02-10 NOTE — Telephone Encounter (Signed)
Prescription Request  02/10/2023  LOV: Visit date not found  What is the name of the medication or equipment? mupirocin ointment (BACTROBAN) 2 %, patient stated that she is needing this as soon as possible  Have you contacted your pharmacy to request a refill? Yes   Which pharmacy would you like this sent to?  [id number], [place] - [address] [place] [address] Phone: [phone] Fax: [phone]   Patient notified that their request is being sent to the clinical staff for review and that they should receive a response within 2 business days.   Please advise at Mobile [phone] (mobile)

## 2023-02-10 NOTE — Telephone Encounter (Signed)
Last office visit 12/19/2022 for ST/HA with Dr. Ermalene Searing.  Last refilled 07/01/2019 for 22 g with 1 refill.  Next Appt: No future appointments.

## 2023-02-10 NOTE — Telephone Encounter (Signed)
Meredith Castillo notified by telephone that refill has been sent to her pharmacy.

## 2023-03-09 DIAGNOSIS — M461 Sacroiliitis, not elsewhere classified: Secondary | ICD-10-CM | POA: Diagnosis not present

## 2023-03-30 DIAGNOSIS — Z79891 Long term (current) use of opiate analgesic: Secondary | ICD-10-CM | POA: Diagnosis not present

## 2023-03-30 DIAGNOSIS — M47816 Spondylosis without myelopathy or radiculopathy, lumbar region: Secondary | ICD-10-CM | POA: Diagnosis not present

## 2023-03-30 DIAGNOSIS — G894 Chronic pain syndrome: Secondary | ICD-10-CM | POA: Diagnosis not present

## 2023-04-23 DIAGNOSIS — M47816 Spondylosis without myelopathy or radiculopathy, lumbar region: Secondary | ICD-10-CM | POA: Diagnosis not present

## 2023-04-23 DIAGNOSIS — M4696 Unspecified inflammatory spondylopathy, lumbar region: Secondary | ICD-10-CM | POA: Diagnosis not present

## 2023-04-23 DIAGNOSIS — Z86718 Personal history of other venous thrombosis and embolism: Secondary | ICD-10-CM | POA: Diagnosis not present

## 2023-05-18 ENCOUNTER — Other Ambulatory Visit: Payer: Self-pay

## 2023-05-18 ENCOUNTER — Telehealth: Payer: Self-pay | Admitting: Adult Health

## 2023-05-18 DIAGNOSIS — F5081 Binge eating disorder: Secondary | ICD-10-CM

## 2023-05-18 DIAGNOSIS — Z79891 Long term (current) use of opiate analgesic: Secondary | ICD-10-CM | POA: Diagnosis not present

## 2023-05-18 DIAGNOSIS — R202 Paresthesia of skin: Secondary | ICD-10-CM | POA: Diagnosis not present

## 2023-05-18 DIAGNOSIS — M47816 Spondylosis without myelopathy or radiculopathy, lumbar region: Secondary | ICD-10-CM | POA: Diagnosis not present

## 2023-05-18 DIAGNOSIS — G894 Chronic pain syndrome: Secondary | ICD-10-CM | POA: Diagnosis not present

## 2023-05-18 MED ORDER — LISDEXAMFETAMINE DIMESYLATE 40 MG PO CAPS: 40.0000 mg | ORAL_CAPSULE | ORAL | 0 refills | Status: DC

## 2023-05-18 NOTE — Telephone Encounter (Signed)
Pt called and needs a refill on her vyvanse 40 mg. [facility] [place]. Next appt in september

## 2023-05-18 NOTE — Telephone Encounter (Signed)
Pended.

## 2023-05-26 DIAGNOSIS — M5416 Radiculopathy, lumbar region: Secondary | ICD-10-CM | POA: Diagnosis not present

## 2023-06-08 ENCOUNTER — Encounter: Payer: Self-pay | Admitting: Adult Health

## 2023-06-08 ENCOUNTER — Ambulatory Visit (INDEPENDENT_AMBULATORY_CARE_PROVIDER_SITE_OTHER): Payer: BC Managed Care – PPO | Admitting: Adult Health

## 2023-06-08 DIAGNOSIS — F5081 Binge eating disorder: Secondary | ICD-10-CM | POA: Diagnosis not present

## 2023-06-08 DIAGNOSIS — F3342 Major depressive disorder, recurrent, in full remission: Secondary | ICD-10-CM

## 2023-06-08 DIAGNOSIS — E282 Polycystic ovarian syndrome: Secondary | ICD-10-CM

## 2023-06-08 DIAGNOSIS — F908 Attention-deficit hyperactivity disorder, other type: Secondary | ICD-10-CM | POA: Diagnosis not present

## 2023-06-08 MED ORDER — LISDEXAMFETAMINE DIMESYLATE 40 MG PO CAPS: 40.0000 mg | ORAL_CAPSULE | ORAL | 0 refills | Status: DC

## 2023-06-08 NOTE — Progress Notes (Signed)
Meredith Castillo [id number] 09-Dec-1980 42 y.o.  Subjective:   Patient ID:  Meredith Castillo is a 42 y.o. (DOB [birth year]) female.  Chief Complaint: No chief complaint on file.   HPI Meredith Castillo presents to the office today for follow-up of PCOS, MDD, GAD, ADD.  Describes mood today as "ok". Pleasant. Mood symptoms - denies depression, anxiety and irritability. Denies panic attacks. Denies worry, rumination and over thinking. Mood is consistent. Stating "I feel like I'm doing ok". Feels like her medication regimen works well for her. Stable interest and motivation. Taking medications as prescribed.  Energy levels stable. Active, does not have a regular exercise routine.  Enjoys some usual interests and activities. Widowed. Lives alone with 2 daughters - 63 and 53. Husband's family local and supportive. Appetite adequate. Weight stable - 280 pounds. Sleeps well most nights. Averages 6.5 to 7 hours with Trazadone. Focus and concentration stable. Completing tasks. Managing aspects of household. 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 Denies SI or HI.  Denies AH or VH. Denies self harm. Denies substance use.  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  Neurological:  Negative for tremors.  Psychiatric/Behavioral:         Please refer to HPI    Medications: I have reviewed the patient's current medications.  Current Outpatient Medications  Medication Sig Dispense Refill   albuterol (PROVENTIL HFA;VENTOLIN HFA) 108 (90 BASE) MCG/ACT inhaler Inhale 2 puffs into the lungs every 4 (four) hours as needed for wheezing or shortness of breath. 1 Inhaler 3   amoxicillin (AMOXIL) 500 MG capsule Take 2 capsules (1,000 mg total) by mouth 2  (two) times daily. 40 capsule 0   buprenorphine (BUTRANS) 7.5 MCG/HR Place 1 patch onto the skin once a week.     cetirizine (ZYRTEC) 10 MG tablet Take 10 mg by mouth daily.     fluticasone (FLONASE) 50 MCG/ACT nasal spray Place 1 spray into both nostrils daily as needed for allergies.     folic acid (FOLVITE) 1 MG tablet Take 1 mg by mouth daily.     gabapentin (NEURONTIN) 300 MG capsule TAKE 1 CAPSULE BY MOUTH THREE TIMES A DAY AS DIRECTED (INCREASE IN FREQUENCY)     ibuprofen (ADVIL,MOTRIN) 200 MG tablet Take 400-800 mg by mouth every 8 (eight) hours as needed for headache or moderate pain.      levonorgestrel (MIRENA) 20 MCG/24HR IUD 1 each by Intrauterine route once.     lisdexamfetamine (VYVANSE) 40 MG capsule Take 1 capsule (40 mg total) by mouth every morning. 30 capsule 0   methylphenidate (CONCERTA) 36 MG PO CR tablet Take 1 tablet (36 mg total) by mouth daily. 30 tablet 0   Multiple Vitamins-Minerals (MULTIVITAMIN WITH MINERALS) tablet Take 1 tablet by mouth daily.     mupirocin ointment (BACTROBAN) 2 % Place 1 Application into the nose as needed (for wound care). 22 g 1   PARoxetine (PAXIL) 30 MG tablet Take 1 tablet (30 mg total) by mouth daily at 10 pm. 90 tablet 3   promethazine (PHENERGAN) 25 MG tablet TAKE 1 TABLET BY MOUTH EVERY 6 HOURS AS NEEDED FOR NAUSEA AND VOMITING 30 tablet 2  traZODone (DESYREL) 50 MG tablet TAKE 1 TABLET BY MOUTH EVERYDAY AT BEDTIME 90 tablet 3   valACYclovir (VALTREX) 1000 MG tablet Take 2 tablets (2,000 mg total) by mouth 2 (two) times daily. 20 tablet 0   No current facility-administered medications for this visit.    Medication Side Effects: None  Allergies:  Allergies  Allergen Reactions   Vancomycin Other (See Comments)    Patient states that when this medication is given intravenously, it causes her kidney's to shut down.   Neosporin [Neomycin-Polymyxin-Gramicidin] Other (See Comments)    Patient states that this medication causes a  poison ivy rash like reaction.    Past Medical History:  Diagnosis Date   Asthma    Bariatric surgery status complicating pregnancy, childbirth, or the puerperium, antepartum condition or complication 2008   HTN (hypertension)    Iron deficiency anemia    due to gastric bypass.    Mild asthma 01/10/2014   Morbid obesity with BMI of 40.0-44.9, adult (HCC)    PCOS (polycystic ovarian syndrome)    PP care - s/p SVD 2/19 11/19/2011   Unspecified vitamin D deficiency    Vitamin B12 deficiency    due to gastric bypass.     Past Medical History, Surgical history, Social history, and Family history were reviewed and updated as appropriate.   Please see review of systems for further details on the patient's review from today.   Objective:   Physical Exam:  There were no vitals taken for this visit.  Physical Exam Constitutional:      General: She is not in acute distress. Musculoskeletal:        General: No deformity.  Neurological:     Mental Status: She is alert and oriented to person, place, and time.     Coordination: Coordination normal.  Psychiatric:        Attention and Perception: Attention and perception normal. She does not perceive auditory or visual hallucinations.        Mood and Affect: Affect is not labile, blunt, angry or inappropriate.        Speech: Speech normal.        Behavior: Behavior normal.        Thought Content: Thought content normal. Thought content is not paranoid or delusional. Thought content does not include homicidal or suicidal ideation. Thought content does not include homicidal or suicidal plan.        Cognition and Memory: Cognition and memory normal.        Judgment: Judgment normal.     Comments: Insight intact     Lab Review:     Component Value Date/Time   NA 136 10/19/2019 1033   NA 140 01/05/2013 0939   K 4.6 10/19/2019 1033   K 4.7 01/05/2013 0939   CL 102 10/19/2019 1033   CL 106 01/05/2013 0939   CO2 30 10/19/2019 1033    CO2 27 01/05/2013 0939   GLUCOSE 100 (H) 10/19/2019 1033   GLUCOSE 75 01/05/2013 0939   BUN 7 10/19/2019 1033   BUN 11.7 01/05/2013 0939   CREATININE 0.75 10/19/2019 1033   CREATININE 0.8 01/05/2013 0939   CALCIUM 9.0 10/19/2019 1033   CALCIUM 9.4 01/05/2013 0939   PROT 6.8 10/19/2019 1033   PROT 7.5 01/05/2013 0939   ALBUMIN 4.0 10/19/2019 1033   ALBUMIN 3.8 01/05/2013 0939   AST 22 10/19/2019 1033   AST 12 01/05/2013 0939   ALT 14 10/19/2019 1033   ALT 13 01/05/2013 0939  ALKPHOS 79 10/19/2019 1033   ALKPHOS 103 01/05/2013 0939   BILITOT 0.5 10/19/2019 1033   BILITOT 0.52 01/05/2013 0939   GFRNONAA >60 08/04/2016 1146   GFRAA >60 08/04/2016 1146       Component Value Date/Time   WBC 6.8 10/19/2019 1033   RBC 4.36 10/19/2019 1033   HGB 12.5 10/19/2019 1033   HGB 13.1 09/05/2013 1552   HCT 38.9 10/19/2019 1033   HCT 40.1 09/05/2013 1552   PLT 260.0 10/19/2019 1033   PLT 257 09/05/2013 1552   MCV 89.2 10/19/2019 1033   MCV 88.7 09/05/2013 1552   MCH 22.9 (L) 08/04/2016 1146   MCHC 32.3 10/19/2019 1033   RDW 15.5 10/19/2019 1033   RDW 13.9 09/05/2013 1552   LYMPHSABS 1.4 10/19/2019 1033   LYMPHSABS 2.7 09/05/2013 1552   MONOABS 0.6 10/19/2019 1033   MONOABS 0.6 09/05/2013 1552   EOSABS 0.4 10/19/2019 1033   EOSABS 0.4 09/05/2013 1552   BASOSABS 0.0 10/19/2019 1033   BASOSABS 0.0 09/05/2013 1552    No results found for: "POCLITH", "LITHIUM"   No results found for: "PHENYTOIN", "PHENOBARB", "VALPROATE", "CBMZ"   .res Assessment: Plan:    Plan:  PDMP reviewed  Trazadone 50mg  at bedtime Paxil 30 mg every evening at bedtime Vyvanse 40 mg every morning   Monitor BP between visits while taking stimulant medication.   RTC 6 months  Patient advised to contact office with any questions, adverse effects, or acute worsening in signs and symptoms.  Discussed potential benefits, risk, and side effects of benzodiazepines to include potential risk of tolerance  and dependence, as well as possible drowsiness.  Advised patient not to drive if experiencing drowsiness and to take lowest possible effective dose to minimize risk of dependence and tolerance.  Discussed potential benefits, risks, and side effects of stimulants with patient to include increased heart rate, palpitations, insomnia, increased anxiety, increased irritability, or decreased appetite.  Instructed patient to contact office if experiencing any significant tolerability issues.  There are no diagnoses linked to this encounter.   Please see After Visit Summary for patient specific instructions.  Future Appointments  Date Time Provider Department Center  06/08/2023  4:20 PM Sherryn Pollino, Thereasa Solo, NP CP-CP None    No orders of the defined types were placed in this encounter.   -------------------------------

## 2023-07-08 DIAGNOSIS — M47816 Spondylosis without myelopathy or radiculopathy, lumbar region: Secondary | ICD-10-CM | POA: Diagnosis not present

## 2023-07-08 DIAGNOSIS — R202 Paresthesia of skin: Secondary | ICD-10-CM | POA: Diagnosis not present

## 2023-07-08 DIAGNOSIS — G894 Chronic pain syndrome: Secondary | ICD-10-CM | POA: Diagnosis not present

## 2023-07-08 DIAGNOSIS — F1120 Opioid dependence, uncomplicated: Secondary | ICD-10-CM | POA: Diagnosis not present

## 2023-07-24 ENCOUNTER — Other Ambulatory Visit: Payer: Self-pay

## 2023-07-24 ENCOUNTER — Telehealth: Payer: Self-pay | Admitting: Adult Health

## 2023-07-24 DIAGNOSIS — F50819 Binge eating disorder, unspecified: Secondary | ICD-10-CM

## 2023-07-24 MED ORDER — LISDEXAMFETAMINE DIMESYLATE 40 MG PO CAPS: 40.0000 mg | ORAL_CAPSULE | ORAL | 0 refills | Status: DC

## 2023-07-24 NOTE — Telephone Encounter (Signed)
Pended.

## 2023-07-24 NOTE — Telephone Encounter (Signed)
Pt called and said that ghina wrote a 90 day script of vyvanse. The pharmacy can only fill 30. So [facility] [address] [facility] [place]. Please write it for 30 and send multiple scripts

## 2023-07-25 ENCOUNTER — Other Ambulatory Visit: Payer: Self-pay | Admitting: Family Medicine

## 2023-07-27 NOTE — Telephone Encounter (Signed)
Last office visit 12/19/22 for ST with Dr. Ermalene Searing.  Last refilled 12/19/22 for #20 with no refills.  Next Appt: No future appointments.  Patient has seen PCP since 12/20/2020.  Refill?

## 2023-08-25 ENCOUNTER — Telehealth: Payer: Self-pay | Admitting: Adult Health

## 2023-08-25 ENCOUNTER — Other Ambulatory Visit: Payer: Self-pay

## 2023-08-25 DIAGNOSIS — F50819 Binge eating disorder, unspecified: Secondary | ICD-10-CM

## 2023-08-25 MED ORDER — LISDEXAMFETAMINE DIMESYLATE 40 MG PO CAPS: 40.0000 mg | ORAL_CAPSULE | ORAL | 0 refills | Status: DC

## 2023-08-25 NOTE — Telephone Encounter (Signed)
PENDED 3 SCRIPTS FOR VYVANSE 40 MG TO REQUESTED PHARMACY.

## 2023-08-25 NOTE — Telephone Encounter (Signed)
Pt called and asked for a 3 separate scripts of of [facility] vyanse [facility] mg [facility] [place]

## 2023-10-30 DIAGNOSIS — M47816 Spondylosis without myelopathy or radiculopathy, lumbar region: Secondary | ICD-10-CM | POA: Diagnosis not present

## 2023-10-30 DIAGNOSIS — G894 Chronic pain syndrome: Secondary | ICD-10-CM | POA: Diagnosis not present

## 2023-10-30 DIAGNOSIS — Z79891 Long term (current) use of opiate analgesic: Secondary | ICD-10-CM | POA: Diagnosis not present

## 2023-11-26 DIAGNOSIS — Z9884 Bariatric surgery status: Secondary | ICD-10-CM | POA: Diagnosis not present

## 2023-11-26 DIAGNOSIS — Z79899 Other long term (current) drug therapy: Secondary | ICD-10-CM | POA: Diagnosis not present

## 2023-11-26 DIAGNOSIS — G8929 Other chronic pain: Secondary | ICD-10-CM | POA: Diagnosis not present

## 2023-11-26 DIAGNOSIS — M461 Sacroiliitis, not elsewhere classified: Secondary | ICD-10-CM | POA: Diagnosis not present

## 2023-11-26 DIAGNOSIS — I82409 Acute embolism and thrombosis of unspecified deep veins of unspecified lower extremity: Secondary | ICD-10-CM | POA: Diagnosis not present

## 2023-11-26 DIAGNOSIS — F411 Generalized anxiety disorder: Secondary | ICD-10-CM | POA: Diagnosis not present

## 2023-11-26 DIAGNOSIS — M47816 Spondylosis without myelopathy or radiculopathy, lumbar region: Secondary | ICD-10-CM | POA: Diagnosis not present

## 2023-11-26 DIAGNOSIS — Z79891 Long term (current) use of opiate analgesic: Secondary | ICD-10-CM | POA: Diagnosis not present

## 2023-11-27 ENCOUNTER — Other Ambulatory Visit: Payer: Self-pay

## 2023-11-27 ENCOUNTER — Telehealth: Payer: Self-pay | Admitting: Adult Health

## 2023-11-27 DIAGNOSIS — F50819 Binge eating disorder, unspecified: Secondary | ICD-10-CM

## 2023-11-27 MED ORDER — LISDEXAMFETAMINE DIMESYLATE 40 MG PO CAPS: 40.0000 mg | ORAL_CAPSULE | ORAL | 0 refills | Status: DC

## 2023-11-27 NOTE — Telephone Encounter (Signed)
 Appt 12/07/23. Requesting refill for generic Vyvanse 40 mg called to:  [id number] [place] [place] - [address]   Phone: [phone]  Fax: [phone]

## 2023-11-27 NOTE — Telephone Encounter (Signed)
 [facility] [place]

## 2023-12-07 ENCOUNTER — Telehealth: Payer: BC Managed Care – PPO | Admitting: Adult Health

## 2023-12-07 ENCOUNTER — Ambulatory Visit: Payer: BC Managed Care – PPO | Admitting: Adult Health

## 2023-12-11 ENCOUNTER — Ambulatory Visit: Admitting: Adult Health

## 2023-12-11 ENCOUNTER — Encounter: Payer: Self-pay | Admitting: Adult Health

## 2023-12-11 DIAGNOSIS — G4700 Insomnia, unspecified: Secondary | ICD-10-CM | POA: Diagnosis not present

## 2023-12-11 DIAGNOSIS — F5081 Binge eating disorder, mild: Secondary | ICD-10-CM

## 2023-12-11 DIAGNOSIS — F3342 Major depressive disorder, recurrent, in full remission: Secondary | ICD-10-CM | POA: Diagnosis not present

## 2023-12-11 DIAGNOSIS — F411 Generalized anxiety disorder: Secondary | ICD-10-CM | POA: Diagnosis not present

## 2023-12-11 DIAGNOSIS — E282 Polycystic ovarian syndrome: Secondary | ICD-10-CM | POA: Diagnosis not present

## 2023-12-11 MED ORDER — LISDEXAMFETAMINE DIMESYLATE 40 MG PO CAPS: 40.0000 mg | ORAL_CAPSULE | ORAL | 0 refills | Status: DC

## 2023-12-11 MED ORDER — PAROXETINE HCL 30 MG PO TABS: 30.0000 mg | ORAL_TABLET | Freq: Every day | ORAL | 3 refills | Status: AC

## 2023-12-11 MED ORDER — TRAZODONE HCL 50 MG PO TABS: ORAL_TABLET | ORAL | 3 refills | Status: AC

## 2023-12-11 NOTE — Progress Notes (Signed)
 Meredith Castillo [id number] September 18, 1981 43 y.o.  Virtual Visit via Telephone Note  I connected with pt on 12/11/23 at 11:30 AM EDT by telephone and verified that I am speaking with the correct person using two identifiers.   I discussed the limitations, risks, security and privacy concerns of performing an evaluation and management service by telephone and the availability of in person appointments. I also discussed with the patient that there may be a patient responsible charge related to this service. The patient expressed understanding and agreed to proceed.   I discussed the assessment and treatment plan with the patient. The patient was provided an opportunity to ask questions and all were answered. The patient agreed with the plan and demonstrated an understanding of the instructions.   The patient was advised to call back or seek an in-person evaluation if the symptoms worsen or if the condition fails to improve as anticipated.  I provided 25 minutes of non-face-to-face time during this encounter.  The patient was located at home.  The provider was located at [facility].   Meredith Gibbs, NP   Subjective:   Patient ID:  Meredith Castillo is a 43 y.o. (DOB December 16, 1980) female.  Chief Complaint: No chief complaint on file.   HPI Meredith Castillo presents for follow-up of PCOS, MDD, GAD, ADD.  Describes mood today as "ok". Pleasant. Mood symptoms - denies depression, anxiety and irritability. Reports stable interest and motivation. Denies panic attacks. Denies worry, rumination and over thinking. Reports some situational stressors with job terminating. Mood is consistent. Stating "I feel like I'm doing alright". Taking medications as prescribed.  Energy levels stable. Active, does not have a regular exercise routine.  Enjoys some usual interests and activities. Widowed. Lives alone with 2 daughters. Husband's family local and supportive. Appetite adequate. Weight stable - 280  pounds. Reports not sleeping as well. Averages 4 to 5 hours with Trazadone. Focus and concentration stable. Completing tasks. Managing aspects of household. 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 Denies SI or HI.  Denies AH or VH. Denies self harm. Denies substance use.  Previous medication trials: Unknown  Review of Systems:  Review of Systems  Musculoskeletal:  Negative for gait problem.  Neurological:  Negative for tremors.  Psychiatric/Behavioral:         Please refer to HPI    Medications: I have reviewed the patient's current medications.  Current Outpatient Medications  Medication Sig Dispense Refill   albuterol (PROVENTIL HFA;VENTOLIN HFA) 108 (90 BASE) MCG/ACT inhaler Inhale 2 puffs into the lungs every 4 (four) hours as needed for wheezing or shortness of breath. 1 Inhaler 3   amoxicillin (AMOXIL) 500 MG capsule Take 2 capsules (1,000 mg total) by mouth 2 (two) times daily. 40 capsule 0   buprenorphine (BUTRANS) 7.5 MCG/HR Place 1 patch onto the skin once a week.     cetirizine (ZYRTEC) 10 MG tablet Take 10 mg by mouth daily.     fluticasone (FLONASE) 50 MCG/ACT nasal spray Place 1 spray into both nostrils daily as needed for allergies.     folic acid (FOLVITE) 1 MG tablet Take 1 mg by mouth daily.     gabapentin (NEURONTIN) 300 MG capsule TAKE 1 CAPSULE BY MOUTH THREE TIMES A DAY AS DIRECTED (INCREASE IN FREQUENCY)     ibuprofen (ADVIL,MOTRIN) 200 MG tablet Take 400-800 mg by mouth  every 8 (eight) hours as needed for headache or moderate pain.      levonorgestrel (MIRENA) 20 MCG/24HR IUD 1 each by Intrauterine route once.     lisdexamfetamine (VYVANSE) 40 MG capsule Take 1 capsule (40 mg total) by mouth every morning. 30 capsule 0   [START ON 01/08/2024] lisdexamfetamine (VYVANSE) 40 MG capsule Take 1 capsule (40 mg total) by mouth every morning. 30 capsule 0   [START  ON 02/05/2024] lisdexamfetamine (VYVANSE) 40 MG capsule Take 1 capsule (40 mg total) by mouth every morning. 30 capsule 0   Multiple Vitamins-Minerals (MULTIVITAMIN WITH MINERALS) tablet Take 1 tablet by mouth daily.     mupirocin ointment (BACTROBAN) 2 % Place 1 Application into the nose as needed (for wound care). 22 g 1   PARoxetine (PAXIL) 30 MG tablet Take 1 tablet (30 mg total) by mouth daily at 10 pm. 90 tablet 3   promethazine (PHENERGAN) 25 MG tablet TAKE 1 TABLET BY MOUTH EVERY 6 HOURS AS NEEDED FOR NAUSEA AND VOMITING 30 tablet 2   traZODone (DESYREL) 50 MG tablet TAKE 1 TO 2 TABLETS BY MOUTH EVERYDAY AT BEDTIME 180 tablet 3   valACYclovir (VALTREX) 1000 MG tablet TAKE 2 TABLETS (2,000 MG TOTAL) BY MOUTH TWICE A DAY 20 tablet 2   No current facility-administered medications for this visit.    Medication Side Effects: None  Allergies:  Allergies  Allergen Reactions   Vancomycin Other (See Comments)    Patient states that when this medication is given intravenously, it causes her kidney's to shut down.   Neosporin [Neomycin-Polymyxin-Gramicidin] Other (See Comments)    Patient states that this medication causes a poison ivy rash like reaction.    Past Medical History:  Diagnosis Date   Asthma    Bariatric surgery status complicating pregnancy, childbirth, or the puerperium, antepartum condition or complication 2008   HTN (hypertension)    Iron deficiency anemia    due to gastric bypass.    Mild asthma 01/10/2014   Morbid obesity with BMI of 40.0-44.9, adult (HCC)    PCOS (polycystic ovarian syndrome)    PP care - s/p SVD 2/19 11/19/2011   Unspecified vitamin D deficiency    Vitamin B12 deficiency    due to gastric bypass.     Family History  Problem Relation Age of Onset   Hypertension Mother    Diabetes Mother    Heart disease Mother    Cancer Maternal Grandfather        brain   Heart attack Paternal Grandfather    Anesthesia problems Neg Hx     Social History    Socioeconomic History   Marital status: Single    Spouse name: Not on file   Number of children: 2   Years of education: Not on file   Highest education level: Not on file  Occupational History    Comment: [employer] employee.   Tobacco Use   Smoking status: Former    Current packs/day: 0.00    Types: Cigarettes    Quit date: 10/12/2005    Years since quitting: 18.1   Smokeless tobacco: Never  Substance and Sexual Activity   Alcohol use: No    Alcohol/week: 0.0 standard drinks of alcohol   Drug use: Yes   Sexual activity: Not Currently    Partners: Male    Birth control/protection: I.U.D.    Comment: widow  Other Topics Concern   Not on file  Social History Narrative   Widowed 2016- husband  had pulmonary disease   2 kids.    Social Drivers of Corporate investment banker Strain: Low Risk  (01/27/2023)   Received from [facility]   Overall Financial Resource Strain (CARDIA)    Difficulty of Paying Living Expenses: Not very hard  Food Insecurity: No Food Insecurity (01/27/2023)   Received from [facility], [facility]   Hunger Vital Sign    Worried About Running Out of Food in the Last Year: Never true    Ran Out of Food in the Last Year: Never true  Transportation Needs: No Transportation Needs (01/27/2023)   Received from [facility]   PRAPARE - Transportation    Lack of Transportation (Medical): No    Lack of Transportation (Non-Medical): No  Physical Activity: Not on file  Stress: Not on file  Social Connections: Unknown (01/26/2022)   Received from [facility]   Social Network    Social Network: Not on file  Intimate Partner Violence: Unknown (12/30/2021)   Received from [facility], [facility]   HITS    Physically Hurt: Not on file    Insult or Talk Down To: Not on file    Threaten Physical Harm: Not on file    Scream or Curse: Not on file    Past Medical History, Surgical history, Social history,  and Family history were reviewed and updated as appropriate.   Please see review of systems for further details on the patient's review from today.   Objective:   Physical Exam:  There were no vitals taken for this visit.  Physical Exam Constitutional:      General: She is not in acute distress. Musculoskeletal:        General: No deformity.  Neurological:     Mental Status: She is alert and oriented to person, place, and time.     Coordination: Coordination normal.  Psychiatric:        Attention and Perception: Attention and perception normal. She does not perceive auditory or visual hallucinations.        Mood and Affect: Affect is not labile, blunt, angry or inappropriate.        Speech: Speech normal.        Behavior: Behavior normal.        Thought Content: Thought content normal. Thought content is not paranoid or delusional. Thought content does not include homicidal or suicidal ideation. Thought content does not include homicidal or suicidal plan.        Cognition and Memory: Cognition and memory normal.        Judgment: Judgment normal.     Comments: Insight intact     Lab Review:     Component Value Date/Time   NA 136 10/19/2019 1033   NA 140 01/05/2013 0939   K 4.6 10/19/2019 1033   K 4.7 01/05/2013 0939   CL 102 10/19/2019 1033   CL 106 01/05/2013 0939   CO2 30 10/19/2019 1033   CO2 27 01/05/2013 0939   GLUCOSE 100 (H) 10/19/2019 1033   GLUCOSE 75 01/05/2013 0939   BUN 7 10/19/2019 1033   BUN 11.7 01/05/2013 0939   CREATININE 0.75 10/19/2019 1033   CREATININE 0.8 01/05/2013 0939   CALCIUM 9.0 10/19/2019 1033   CALCIUM 9.4 01/05/2013 0939   PROT 6.8 10/19/2019 1033   PROT 7.5 01/05/2013 0939   ALBUMIN 4.0 10/19/2019 1033   ALBUMIN 3.8 01/05/2013 0939   AST 22 10/19/2019 1033   AST  12 01/05/2013 0939   ALT 14 10/19/2019 1033   ALT 13 01/05/2013 0939   ALKPHOS 79 10/19/2019 1033   ALKPHOS 103 01/05/2013 0939   BILITOT 0.5 10/19/2019 1033   BILITOT  0.52 01/05/2013 0939   GFRNONAA >60 08/04/2016 1146   GFRAA >60 08/04/2016 1146       Component Value Date/Time   WBC 6.8 10/19/2019 1033   RBC 4.36 10/19/2019 1033   HGB 12.5 10/19/2019 1033   HGB 13.1 09/05/2013 1552   HCT 38.9 10/19/2019 1033   HCT 40.1 09/05/2013 1552   PLT 260.0 10/19/2019 1033   PLT 257 09/05/2013 1552   MCV 89.2 10/19/2019 1033   MCV 88.7 09/05/2013 1552   MCH 22.9 (L) 08/04/2016 1146   MCHC 32.3 10/19/2019 1033   RDW 15.5 10/19/2019 1033   RDW 13.9 09/05/2013 1552   LYMPHSABS 1.4 10/19/2019 1033   LYMPHSABS 2.7 09/05/2013 1552   MONOABS 0.6 10/19/2019 1033   MONOABS 0.6 09/05/2013 1552   EOSABS 0.4 10/19/2019 1033   EOSABS 0.4 09/05/2013 1552   BASOSABS 0.0 10/19/2019 1033   BASOSABS 0.0 09/05/2013 1552    No results found for: "POCLITH", "LITHIUM"   No results found for: "PHENYTOIN", "PHENOBARB", "VALPROATE", "CBMZ"   .res Assessment: Plan:    Plan:  PDMP reviewed  Trazadone 50mg  at bedtime Paxil 30 mg every evening at bedtime Vyvanse 40 mg every morning   Monitor BP between visits while taking stimulant medication.   RTC 6 months  15 minutes spent dedicated to the care of this patient on the date of this encounter to include pre-visit review of records, ordering of medication, post visit documentation, and face-to-face time with the patient discussing PCOS, MDD, GAD, ADD. Discussed continuing current medication regimen.  Patient advised to contact office with any questions, adverse effects, or acute worsening in signs and symptoms.  Discussed potential benefits, risk, and side effects of benzodiazepines to include potential risk of tolerance and dependence, as well as possible drowsiness.  Advised patient not to drive if experiencing drowsiness and to take lowest possible effective dose to minimize risk of dependence and tolerance.  Discussed potential benefits, risks, and side effects of stimulants with patient to include increased  heart rate, palpitations, insomnia, increased anxiety, increased irritability, or decreased appetite.  Instructed patient to contact office if experiencing any significant tolerability issues.  Diagnoses and all orders for this visit:  Major depressive disorder, recurrent episode, in full remission (HCC) -     PARoxetine (PAXIL) 30 MG tablet; Take 1 tablet (30 mg total) by mouth daily at 10 pm. -     traZODone (DESYREL) 50 MG tablet; TAKE 1 TO 2 TABLETS BY MOUTH EVERYDAY AT BEDTIME  Generalized anxiety disorder -     PARoxetine (PAXIL) 30 MG tablet; Take 1 tablet (30 mg total) by mouth daily at 10 pm. -     traZODone (DESYREL) 50 MG tablet; TAKE 1 TO 2 TABLETS BY MOUTH EVERYDAY AT BEDTIME  PCOS (polycystic ovarian syndrome)  Insomnia, unspecified type  Binge eating disorder -     lisdexamfetamine (VYVANSE) 40 MG capsule; Take 1 capsule (40 mg total) by mouth every morning.  Other orders -     lisdexamfetamine (VYVANSE) 40 MG capsule; Take 1 capsule (40 mg total) by mouth every morning. -     lisdexamfetamine (VYVANSE) 40 MG capsule; Take 1 capsule (40 mg total) by mouth every morning.    Please see After Visit Summary for patient specific instructions.  No future appointments.   No orders of the defined types were placed in this encounter.     -------------------------------

## 2023-12-30 DIAGNOSIS — Z79891 Long term (current) use of opiate analgesic: Secondary | ICD-10-CM | POA: Diagnosis not present

## 2023-12-30 DIAGNOSIS — M47816 Spondylosis without myelopathy or radiculopathy, lumbar region: Secondary | ICD-10-CM | POA: Diagnosis not present

## 2023-12-30 DIAGNOSIS — G894 Chronic pain syndrome: Secondary | ICD-10-CM | POA: Diagnosis not present

## 2024-01-26 ENCOUNTER — Telehealth: Payer: Self-pay | Admitting: Adult Health

## 2024-01-26 NOTE — Telephone Encounter (Signed)
 She should have 2 RF available. Notified her. LF 3/28.

## 2024-01-26 NOTE — Telephone Encounter (Signed)
 Next visit is 03/11/24. Requesting a refill on generic Vyvanse  40 mg called to:  [id number] [place], [place] - [address]   Phone: [phone]  Fax: [phone]    Adhithi said she has two pills left.

## 2024-03-11 ENCOUNTER — Encounter: Payer: Self-pay | Admitting: Adult Health

## 2024-03-11 ENCOUNTER — Telehealth: Admitting: Adult Health

## 2024-03-11 DIAGNOSIS — F3342 Major depressive disorder, recurrent, in full remission: Secondary | ICD-10-CM

## 2024-03-11 DIAGNOSIS — E282 Polycystic ovarian syndrome: Secondary | ICD-10-CM | POA: Diagnosis not present

## 2024-03-11 DIAGNOSIS — F5081 Binge eating disorder, mild: Secondary | ICD-10-CM

## 2024-03-11 DIAGNOSIS — F411 Generalized anxiety disorder: Secondary | ICD-10-CM | POA: Diagnosis not present

## 2024-03-11 DIAGNOSIS — F909 Attention-deficit hyperactivity disorder, unspecified type: Secondary | ICD-10-CM | POA: Diagnosis not present

## 2024-03-11 DIAGNOSIS — F329 Major depressive disorder, single episode, unspecified: Secondary | ICD-10-CM | POA: Diagnosis not present

## 2024-03-11 DIAGNOSIS — G4700 Insomnia, unspecified: Secondary | ICD-10-CM

## 2024-03-11 MED ORDER — LISDEXAMFETAMINE DIMESYLATE 40 MG PO CAPS: 40.0000 mg | ORAL_CAPSULE | ORAL | 0 refills | Status: DC

## 2024-03-11 NOTE — Progress Notes (Signed)
 Meredith Castillo [id number] [birth year] 43 y.o.  Virtual Visit via Video Note  I connected with pt @ on 03/11/24 at  2:00 PM EDT by a video enabled telemedicine application and verified that I am speaking with the correct person using two identifiers.   I discussed the limitations of evaluation and management by telemedicine and the availability of in person appointments. The patient expressed understanding and agreed to proceed.  I discussed the assessment and treatment plan with the patient. The patient was provided an opportunity to ask questions and all were answered. The patient agreed with the plan and demonstrated an understanding of the instructions.   The patient was advised to call back or seek an in-person evaluation if the symptoms worsen or if the condition fails to improve as anticipated.  I provided 25 minutes of non-face-to-face time during this encounter.  The patient was located at home.  The provider was located at [facility].   Reagan Camera, NP   Subjective:   Patient ID:  Meredith Castillo is a 43 y.o. (DOB 09-Sep-1981) female.  Chief Complaint: No chief complaint on file.   HPI Meredith Castillo presents for follow-up of PCOS, MDD, GAD, ADD.  Describes mood today as ok. Pleasant. Mood symptoms - reports some situational depression, anxiety and irritability. Reports stable interest and motivation. Denies panic attacks. Reports some worry with being out of work. Denies rumination and over thinking. Reports some situational stressors with loss of job. Mood is stable. Stating I feel like I'm doing ok for the most part - hanging in there. Taking medications as prescribed.  Energy levels stable - tired sometimes. Active, does not have a regular exercise routine.  Enjoys some usual interests and activities. Widowed. Lives alone with 2 daughters. Husband's family local and supportive. Appetite adequate. Weight stable - 280 pounds. Reports not sleeping as well.  Averages 6 to 8 hours with Trazadone. Focus and concentration stable, but lacks direction. Completing tasks. Managing aspects of household. Recently laid off from work - seeking employment. Denies SI or HI.  Denies AH or VH. Denies self harm. Denies substance use.  Previous medication trials: Unknown  Review of Systems:  Review of Systems  Musculoskeletal:  Negative for gait problem.  Neurological:  Negative for tremors.  Psychiatric/Behavioral:         Please refer to HPI    Medications: I have reviewed the patient's current medications.  Current Outpatient Medications  Medication Sig Dispense Refill   albuterol  (PROVENTIL  HFA;VENTOLIN  HFA) 108 (90 BASE) MCG/ACT inhaler Inhale 2 puffs into the lungs every 4 (four) hours as needed for wheezing or shortness of breath. 1 Inhaler 3   amoxicillin  (AMOXIL ) 500 MG capsule Take 2 capsules (1,000 mg total) by mouth 2 (two) times daily. 40 capsule 0   buprenorphine (BUTRANS) 7.5 MCG/HR Place 1 patch onto the skin once a week.     cetirizine (ZYRTEC) 10 MG tablet Take 10 mg by mouth daily.     fluticasone  (FLONASE ) 50 MCG/ACT nasal spray Place 1 spray into both nostrils daily as needed for allergies.     folic acid (FOLVITE) 1 MG tablet Take 1 mg by mouth daily.     gabapentin (NEURONTIN) 300 MG capsule TAKE 1 CAPSULE BY MOUTH THREE TIMES A DAY AS DIRECTED (INCREASE IN FREQUENCY)     ibuprofen  (ADVIL ,MOTRIN ) 200 MG tablet Take 400-800 mg by mouth every 8 (eight) hours as needed for headache or moderate pain.      levonorgestrel  (  MIRENA) 20 MCG/24HR IUD 1 each by Intrauterine route once.     lisdexamfetamine (VYVANSE ) 40 MG capsule Take 1 capsule (40 mg total) by mouth every morning. 30 capsule 0   lisdexamfetamine (VYVANSE ) 40 MG capsule Take 1 capsule (40 mg total) by mouth every morning. 30 capsule 0   lisdexamfetamine (VYVANSE ) 40 MG capsule Take 1 capsule (40 mg total) by mouth every morning. 30 capsule 0   Multiple Vitamins-Minerals  (MULTIVITAMIN WITH MINERALS) tablet Take 1 tablet by mouth daily.     mupirocin  ointment (BACTROBAN ) 2 % Place 1 Application into the nose as needed (for wound care). 22 g 1   PARoxetine  (PAXIL ) 30 MG tablet Take 1 tablet (30 mg total) by mouth daily at 10 pm. 90 tablet 3   promethazine  (PHENERGAN ) 25 MG tablet TAKE 1 TABLET BY MOUTH EVERY 6 HOURS AS NEEDED FOR NAUSEA AND VOMITING 30 tablet 2   traZODone  (DESYREL ) 50 MG tablet TAKE 1 TO 2 TABLETS BY MOUTH EVERYDAY AT BEDTIME 180 tablet 3   valACYclovir  (VALTREX ) 1000 MG tablet TAKE 2 TABLETS (2,000 MG TOTAL) BY MOUTH TWICE A DAY 20 tablet 2   No current facility-administered medications for this visit.    Medication Side Effects: None  Allergies:  Allergies  Allergen Reactions   Vancomycin Other (See Comments)    Patient states that when this medication is given intravenously, it causes her kidney's to shut down.   Neosporin [Neomycin-Polymyxin-Gramicidin] Other (See Comments)    Patient states that this medication causes a poison ivy rash like reaction.    Past Medical History:  Diagnosis Date   Asthma    Bariatric surgery status complicating pregnancy, childbirth, or the puerperium, antepartum condition or complication 2008   HTN (hypertension)    Iron deficiency anemia    due to gastric bypass.    Mild asthma 01/10/2014   Morbid obesity with BMI of 40.0-44.9, adult (HCC)    PCOS (polycystic ovarian syndrome)    PP care - s/p SVD 2/19 11/19/2011   Unspecified vitamin D deficiency    Vitamin B12 deficiency    due to gastric bypass.     Family History  Problem Relation Age of Onset   Hypertension Mother    Diabetes Mother    Heart disease Mother    Cancer Maternal Grandfather        brain   Heart attack Paternal Grandfather    Anesthesia problems Neg Hx     Social History   Socioeconomic History   Marital status: Single    Spouse name: Not on file   Number of children: 2   Years of education: Not on file   Highest  education level: Not on file  Occupational History    Comment: [employer] employee.   Tobacco Use   Smoking status: Former    Current packs/day: 0.00    Types: Cigarettes    Quit date: 10/12/2005    Years since quitting: 18.4   Smokeless tobacco: Never  Substance and Sexual Activity   Alcohol use: No    Alcohol/week: 0.0 standard drinks of alcohol   Drug use: Yes   Sexual activity: Not Currently    Partners: Male    Birth control/protection: I.U.D.    Comment: widow  Other Topics Concern   Not on file  Social History Narrative   Widowed 2016- husband had pulmonary disease   2 kids.    Social Drivers of Health   Financial Resource Strain: Low Risk  (01/27/2023)  Received from [employer]   Overall Financial Resource Strain (CARDIA)    Difficulty of Paying Living Expenses: Not very hard  Food Insecurity: No Food Insecurity (01/27/2023)   Received from [facility]   Hunger Vital Sign    Within the past 12 months, you worried that your food would run out before you got the money to buy more.: Never true    Within the past 12 months, the food you bought just didn't last and you didn't have money to get more.: Never true  Transportation Needs: No Transportation Needs (01/27/2023)   Received from [facility] - Transportation    Lack of Transportation (Medical): No    Lack of Transportation (Non-Medical): No  Physical Activity: Not on file  Stress: Not on file  Social Connections: Unknown (01/26/2022)   Received from [facility]   Social Network    Social Network: Not on file  Intimate Partner Violence: Unknown (12/30/2021)   Received from [facility]   HITS    Physically Hurt: Not on file    Insult or Talk Down To: Not on file    Threaten Physical Harm: Not on file    Scream or Curse: Not on file    Past Medical History, Surgical history, Social history, and Family history were reviewed and updated as appropriate.   Please see review of systems for  further details on the patient's review from today.   Objective:   Physical Exam:  There were no vitals taken for this visit.  Physical Exam Constitutional:      General: She is not in acute distress.  Musculoskeletal:        General: No deformity.   Neurological:     Mental Status: She is alert and oriented to person, place, and time.     Coordination: Coordination normal.   Psychiatric:        Attention and Perception: Attention and perception normal. She does not perceive auditory or visual hallucinations.        Mood and Affect: Mood normal. Mood is not anxious or depressed. Affect is not labile, blunt, angry or inappropriate.        Speech: Speech normal.        Behavior: Behavior normal.        Thought Content: Thought content normal. Thought content is not paranoid or delusional. Thought content does not include homicidal or suicidal ideation. Thought content does not include homicidal or suicidal plan.        Cognition and Memory: Cognition and memory normal.        Judgment: Judgment normal.     Comments: Insight intact     Lab Review:     Component Value Date/Time   NA 136 10/19/2019 1033   NA 140 01/05/2013 0939   K 4.6 10/19/2019 1033   K 4.7 01/05/2013 0939   CL 102 10/19/2019 1033   CL 106 01/05/2013 0939   CO2 30 10/19/2019 1033   CO2 27 01/05/2013 0939   GLUCOSE 100 (H) 10/19/2019 1033   GLUCOSE 75 01/05/2013 0939   BUN 7 10/19/2019 1033   BUN 11.7 01/05/2013 0939   CREATININE 0.75 10/19/2019 1033   CREATININE 0.8 01/05/2013 0939   CALCIUM  9.0 10/19/2019 1033   CALCIUM  9.4 01/05/2013 0939   PROT 6.8 10/19/2019 1033   PROT 7.5 01/05/2013 0939   ALBUMIN 4.0 10/19/2019 1033   ALBUMIN 3.8 01/05/2013 0939   AST 22 10/19/2019 1033   AST 12  01/05/2013 0939   ALT 14 10/19/2019 1033   ALT 13 01/05/2013 0939   ALKPHOS 79 10/19/2019 1033   ALKPHOS 103 01/05/2013 0939   BILITOT 0.5 10/19/2019 1033   BILITOT 0.52 01/05/2013 0939   GFRNONAA >60  08/04/2016 1146   GFRAA >60 08/04/2016 1146       Component Value Date/Time   WBC 6.8 10/19/2019 1033   RBC 4.36 10/19/2019 1033   HGB 12.5 10/19/2019 1033   HGB 13.1 09/05/2013 1552   HCT 38.9 10/19/2019 1033   HCT 40.1 09/05/2013 1552   PLT 260.0 10/19/2019 1033   PLT 257 09/05/2013 1552   MCV 89.2 10/19/2019 1033   MCV 88.7 09/05/2013 1552   MCH 22.9 (L) 08/04/2016 1146   MCHC 32.3 10/19/2019 1033   RDW 15.5 10/19/2019 1033   RDW 13.9 09/05/2013 1552   LYMPHSABS 1.4 10/19/2019 1033   LYMPHSABS 2.7 09/05/2013 1552   MONOABS 0.6 10/19/2019 1033   MONOABS 0.6 09/05/2013 1552   EOSABS 0.4 10/19/2019 1033   EOSABS 0.4 09/05/2013 1552   BASOSABS 0.0 10/19/2019 1033   BASOSABS 0.0 09/05/2013 1552    No results found for: POCLITH, LITHIUM   No results found for: PHENYTOIN, PHENOBARB, VALPROATE, CBMZ   .res Assessment: Plan:    Plan:  PDMP reviewed  Trazadone 50mg  at bedtime Paxil  30 mg every evening at bedtime Vyvanse  40 mg every morning   Monitor BP between visits while taking stimulant medication.   RTC 3 months  25 minutes spent dedicated to the care of this patient on the date of this encounter to include pre-visit review of records, ordering of medication, post visit documentation, and face-to-face time with the patient discussing PCOS, MDD, GAD, ADD. Discussed continuing current medication regimen.  Patient advised to contact office with any questions, adverse effects, or acute worsening in signs and symptoms.  Discussed potential benefits, risk, and side effects of benzodiazepines to include potential risk of tolerance and dependence, as well as possible drowsiness.  Advised patient not to drive if experiencing drowsiness and to take lowest possible effective dose to minimize risk of dependence and tolerance.  Discussed potential benefits, risks, and side effects of stimulants with patient to include increased heart rate, palpitations, insomnia,  increased anxiety, increased irritability, or decreased appetite.  Instructed patient to contact office if experiencing any significant tolerability issues.  There are no diagnoses linked to this encounter.   Please see After Visit Summary for patient specific instructions.  Future Appointments  Date Time Provider Department Center  03/11/2024  2:00 PM Makila Colombe Nattalie, NP [facility] None    No orders of the defined types were placed in this encounter.     -------------------------------

## 2024-05-03 DIAGNOSIS — M47816 Spondylosis without myelopathy or radiculopathy, lumbar region: Secondary | ICD-10-CM | POA: Diagnosis not present

## 2024-05-03 DIAGNOSIS — L2089 Other atopic dermatitis: Secondary | ICD-10-CM | POA: Diagnosis not present

## 2024-05-03 DIAGNOSIS — G894 Chronic pain syndrome: Secondary | ICD-10-CM | POA: Diagnosis not present

## 2024-05-03 DIAGNOSIS — Z79891 Long term (current) use of opiate analgesic: Secondary | ICD-10-CM | POA: Diagnosis not present

## 2024-06-08 ENCOUNTER — Ambulatory Visit: Admitting: Family Medicine

## 2024-06-08 ENCOUNTER — Encounter: Payer: Self-pay | Admitting: Family Medicine

## 2024-06-08 ENCOUNTER — Ambulatory Visit: Payer: Self-pay

## 2024-06-08 VITALS — BP 160/98 | HR 94 | Temp 98.6°F | Ht 67.5 in | Wt 315.0 lb

## 2024-06-08 DIAGNOSIS — Z6841 Body Mass Index (BMI) 40.0 and over, adult: Secondary | ICD-10-CM | POA: Diagnosis not present

## 2024-06-08 DIAGNOSIS — Z23 Encounter for immunization: Secondary | ICD-10-CM

## 2024-06-08 DIAGNOSIS — I10 Essential (primary) hypertension: Secondary | ICD-10-CM | POA: Diagnosis not present

## 2024-06-08 DIAGNOSIS — I4510 Unspecified right bundle-branch block: Secondary | ICD-10-CM | POA: Diagnosis not present

## 2024-06-08 LAB — BASIC METABOLIC PANEL WITH GFR
BUN: 8 mg/dL (ref 6–23)
CO2: 31 meq/L (ref 19–32)
Calcium: 8.9 mg/dL (ref 8.4–10.5)
Chloride: 99 meq/L (ref 96–112)
Creatinine, Ser: 0.67 mg/dL (ref 0.40–1.20)
GFR: 105.36 mL/min (ref >60.00)
Glucose, Bld: 87 mg/dL (ref 70–99)
Potassium: 4.1 meq/L (ref 3.5–5.1)
Sodium: 137 meq/L (ref 135–145)

## 2024-06-08 LAB — TSH: TSH: 0.75 u[IU]/mL (ref 0.35–5.50)

## 2024-06-08 LAB — CBC WITH DIFFERENTIAL/PLATELET
Basophils Absolute: 0.1 K/uL (ref 0.0–0.1)
Basophils Relative: 0.7 % (ref 0.0–3.0)
Eosinophils Absolute: 0.2 K/uL (ref 0.0–0.7)
Eosinophils Relative: 2.3 % (ref 0.0–5.0)
HCT: 33.4 % — ABNORMAL LOW (ref 36.0–46.0)
Hemoglobin: 10.3 g/dL — ABNORMAL LOW (ref 12.0–15.0)
Lymphocytes Relative: 22.3 % (ref 12.0–46.0)
Lymphs Abs: 1.8 K/uL (ref 0.7–4.0)
MCHC: 31 g/dL (ref 30.0–36.0)
MCV: 75.7 fl — ABNORMAL LOW (ref 78.0–100.0)
Monocytes Absolute: 0.5 K/uL (ref 0.1–1.0)
Monocytes Relative: 5.8 % (ref 3.0–12.0)
Neutro Abs: 5.5 K/uL (ref 1.4–7.7)
Neutrophils Relative %: 68.9 % (ref 43.0–77.0)
Platelets: 330 K/uL (ref 150.0–400.0)
RBC: 4.41 Mil/uL (ref 3.87–5.11)
RDW: 18.4 % — ABNORMAL HIGH (ref 11.5–15.5)
WBC: 7.9 K/uL (ref 4.0–10.5)

## 2024-06-08 MED ORDER — AMLODIPINE BESYLATE 5 MG PO TABS: 5.0000 mg | ORAL_TABLET | Freq: Every day | ORAL | 3 refills | Status: DC

## 2024-06-08 NOTE — Assessment & Plan Note (Addendum)
 Markedly elevated readings.  Anticipate multifactorial - chronic pain, stimulant use, obesity.  Rec buy BP cuff and start monitoring at home.  Start amlodipine  5mg  daily, reviewed side effects to monitor for. If after 10 days BP staying above goal, may increase to 10mg  daily.  Will also reassess BP control after upcoming lumbar rhizotomy, she will touch base with psychiatrist on stimulant.  RTC 3-4 weeks HTN f/u visit.

## 2024-06-08 NOTE — Telephone Encounter (Signed)
 FYI Only or Action Required?: Action required by provider: request for appointment.  Patient was last seen in primary care on 12/19/2022 by Avelina Greig BRAVO, MD.  Called Nurse Triage reporting Hypertension.  Symptoms began yesterday.  Interventions attempted: Nothing.  Symptoms are: stable. In dentist office yesterday, had elevated BP readings, no symptoms. Has not checked BP today.  Triage Disposition: See Physician Within 24 Hours  Patient/caregiver understands and will follow disposition?: Yes   Copied from CRM #[id number]. Topic: Clinical - Red Word Triage >> Jun 08, 2024 11:18 AM Berneda FALCON wrote: Red Word that prompted transfer to Nurse Triage: Patient went from [facility] she was turned away for high blood pressure.She states the numbers were 175/127. Has had high blood pressure in the past but never this high.They tried to send her to the ED but she declined and wanted to schedule a follow up with the PCP. She last saw Greig Avelina 2024 but is a [facility] patient.  Also notes that her ears are a bright red color but this is the only other symptom. Reason for Disposition  Systolic BP >= 180 OR Diastolic >= 110  Answer Assessment - Initial Assessment Questions 1. BLOOD PRESSURE: What is your blood pressure? Did you take at least two measurements 5 minutes apart?     157/117  172/108  175/121 2. ONSET: When did you take your blood pressure?     yesterday 3. HOW: How did you take your blood pressure? (e.g., automatic home BP monitor, visiting nurse)     Nurse at dentist 4. HISTORY: Do you have a history of high blood pressure?     Yes - years ago 5. MEDICINES: Are you taking any medicines for blood pressure? Have you missed any doses recently?     no 6. OTHER SYMPTOMS: Do you have any symptoms? (e.g., blurred vision, chest pain, difficulty breathing, headache, weakness)     no 7. PREGNANCY: Is there any chance you are pregnant? When was your last menstrual  period?     no  Protocols used: Blood Pressure - High-A-AH

## 2024-06-08 NOTE — Progress Notes (Signed)
 Ph: [phone] Fax: [phone]   Patient ID: Meredith Castillo, female    DOB: 1980/11/15, 43 y.o.   MRN: [id number]  This visit was conducted in person.  BP (!) 160/98   Pulse 94   Temp 98.6 F (37 C) (Oral)   Ht 5' 7.5 (1.715 m)   Wt (!) 315 lb (142.9 kg)   SpO2 99%   BMI 48.61 kg/m   BP Readings from Last 3 Encounters:  06/08/24 (!) 160/98  12/19/22 (!) 140/98  12/09/21 130/84  178/106 on recheck R arm  CC: hypertension  Subjective:   HPI: Meredith Castillo is a 43 y.o. female presenting on 06/08/2024 for Hypertension (Pt here for HTN)   Patient of Dr Eleanore last seen for acute visit 11/2022, last saw PCP 11/2020.   Seen yesterday at [facility] - turned away due to elevated BP readings - 157/117, 172/108, 175/121.   H/o borderline elevated BP readings when last seen in the office to 140/98 (11/2022).  She also notes elevated readings at  spine clinic appts to 140/90s.   Denies significant salt/sodium in diet.   Slight HA behind the eyes - but not bothersome enough to take anything for this.  Denies vision changes, CP/tightness, SOB, leg swelling.   Restorative sleep. No snoring. No morning headaches, no daytime sleepiness, no PNdyspnea.   She uses ibuprofen  400mg  PRN cluster headaches - rare use.  ADHD - on Vyvanse  40mg  daily through psychiatry - on this for the past 3-4 yrs.   Chronic low back pain - 4/10 level. She uses low dose Butrans patch.  Upcoming B L5/S1 and S1/2 radiofrequency facet rhizotomy through [facility] Dr Waylon.   LMP ~2020 - on Mirena     Relevant past medical, surgical, family and social history reviewed and updated as indicated. Interim medical history since our last visit reviewed. Allergies and medications reviewed and updated. Outpatient Medications Prior to Visit  Medication Sig Dispense Refill   albuterol  (PROVENTIL  HFA;VENTOLIN  HFA) 108 (90 BASE) MCG/ACT inhaler Inhale 2 puffs into the lungs every 4 (four)  hours as needed for wheezing or shortness of breath. 1 Inhaler 3   buprenorphine (BUTRANS) 7.5 MCG/HR Place 1 patch onto the skin once a week.     cetirizine (ZYRTEC) 10 MG tablet Take 10 mg by mouth daily.     clobetasol ointment (TEMOVATE) 0.05 % Apply topically 2 (two) times daily as needed.     fluticasone  (FLONASE ) 50 MCG/ACT nasal spray Place 1 spray into both nostrils daily as needed for allergies.     gabapentin (NEURONTIN) 300 MG capsule TAKE 1 CAPSULE BY MOUTH THREE TIMES A DAY AS DIRECTED (INCREASE IN FREQUENCY)     ibuprofen  (ADVIL ,MOTRIN ) 200 MG tablet Take 400-800 mg by mouth every 8 (eight) hours as needed for headache or moderate pain.      levonorgestrel  (MIRENA) 20 MCG/24HR IUD 1 each by Intrauterine route once.     lisdexamfetamine (VYVANSE ) 40 MG capsule Take 1 capsule (40 mg total) by mouth every morning. 30 capsule 0   lisdexamfetamine (VYVANSE ) 40 MG capsule Take 1 capsule (40 mg total) by mouth every morning. 30 capsule 0   lisdexamfetamine (VYVANSE ) 40 MG capsule Take 1 capsule (40 mg total) by mouth every morning. 30 capsule 0   Multiple Vitamins-Minerals (MULTIVITAMIN WITH MINERALS) tablet Take 1 tablet by mouth daily.     mupirocin  ointment (BACTROBAN ) 2 % Place 1 Application into the nose as needed (for  wound care). 22 g 1   PARoxetine  (PAXIL ) 30 MG tablet Take 1 tablet (30 mg total) by mouth daily at 10 pm. 90 tablet 3   promethazine  (PHENERGAN ) 25 MG tablet TAKE 1 TABLET BY MOUTH EVERY 6 HOURS AS NEEDED FOR NAUSEA AND VOMITING 30 tablet 2   sodium fluoride (PREVIDENT 5000 DRY MOUTH) 1.1 % GEL dental gel Use Prevident 5000 once daily instead of the regular toothpaste (preferably at bed time). Apply a pea-sized amount onto a soft-toothbrush. Brush teeth for 2 minutes. Spit out thoroughly the toothpaste but do not rinse, eat or drink for 30 min. DO NOT SWALLOW IT.     traZODone  (DESYREL ) 50 MG tablet TAKE 1 TO 2 TABLETS BY MOUTH EVERYDAY AT BEDTIME 180 tablet 3    valACYclovir  (VALTREX ) 1000 MG tablet TAKE 2 TABLETS (2,000 MG TOTAL) BY MOUTH TWICE A DAY 20 tablet 2   folic acid (FOLVITE) 1 MG tablet Take 1 mg by mouth daily. (Patient not taking: Reported on 06/08/2024)     amoxicillin  (AMOXIL ) 500 MG capsule Take 2 capsules (1,000 mg total) by mouth 2 (two) times daily. (Patient not taking: Reported on 06/08/2024) 40 capsule 0   No facility-administered medications prior to visit.     Per HPI unless specifically indicated in ROS section below Review of Systems  Objective:  BP (!) 160/98   Pulse 94   Temp 98.6 F (37 C) (Oral)   Ht 5' 7.5 (1.715 m)   Wt (!) 315 lb (142.9 kg)   SpO2 99%   BMI 48.61 kg/m   Wt Readings from Last 3 Encounters:  06/08/24 (!) 315 lb (142.9 kg)  12/19/22 (!) 311 lb 2 oz (141.1 kg)  12/09/21 (!) 304 lb 9.6 oz (138.2 kg)      Physical Exam Vitals and nursing note reviewed.  Constitutional:      Appearance: Normal appearance.  HENT:     Head: Normocephalic and atraumatic.     Mouth/Throat:     Mouth: Mucous membranes are moist.     Pharynx: Oropharynx is clear. No oropharyngeal exudate or posterior oropharyngeal erythema.  Eyes:     Extraocular Movements: Extraocular movements intact.     Pupils: Pupils are equal, round, and reactive to light.  Neck:     Thyroid : No thyroid  mass or thyromegaly.  Cardiovascular:     Rate and Rhythm: Normal rate and regular rhythm.     Pulses: Normal pulses.     Heart sounds: Normal heart sounds. No murmur heard. Pulmonary:     Effort: Pulmonary effort is normal. No respiratory distress.     Breath sounds: Normal breath sounds. No wheezing, rhonchi or rales.  Musculoskeletal:     Cervical back: Normal range of motion and neck supple.     Right lower leg: No edema.     Left lower leg: No edema.  Skin:    General: Skin is warm and dry.     Findings: No rash.  Neurological:     Mental Status: She is alert.  Psychiatric:        Mood and Affect: Mood normal.         Behavior: Behavior normal.       Lab Results  Component Value Date   TSH 2.23 08/05/2016    Lab Results  Component Value Date   NA 136 10/19/2019   CL 102 10/19/2019   K 4.6 10/19/2019   CO2 30 10/19/2019   BUN 7 10/19/2019   CREATININE  0.75 10/19/2019   GFR 85.14 10/19/2019   CALCIUM  9.0 10/19/2019   ALBUMIN 4.0 10/19/2019   GLUCOSE 100 (H) 10/19/2019    Lab Results  Component Value Date   WBC 6.8 10/19/2019   HGB 12.5 10/19/2019   HCT 38.9 10/19/2019   MCV 89.2 10/19/2019   PLT 260.0 10/19/2019    EKG - NSR rate 75, RBBB, no old to compare   Assessment & Plan:   Problem List Items Addressed This Visit     Severe obesity (BMI >= 40) (HCC) (Chronic)   Primary hypertension - Primary   Markedly elevated readings.  Anticipate multifactorial - chronic pain, stimulant use, obesity.  Rec buy BP cuff and start monitoring at home.  Start amlodipine  5mg  daily, reviewed side effects to monitor for. If after 10 days BP staying above goal, may increase to 10mg  daily.  Will also reassess BP control after upcoming lumbar rhizotomy, she will touch base with psychiatrist on stimulant.  RTC 3-4 weeks HTN f/u visit.      Relevant Medications   amLODipine  (NORVASC ) 5 MG tablet   Other Relevant Orders   CBC with Differential/Platelet   Basic metabolic panel with GFR   TSH   RBBB   Noted on baseline EKG obtained for hypertension.  Denies symptoms of OSA.  No known respiratory history.  Could consider further OSA eval.       Relevant Medications   amLODipine  (NORVASC ) 5 MG tablet   Other Visit Diagnoses       Encounter for immunization       Relevant Orders   Flu vaccine trivalent PF, 6mos and older(Flulaval,Afluria,Fluarix,Fluzone) (Completed)        Meds ordered this encounter  Medications   amLODipine  (NORVASC ) 5 MG tablet    Sig: Take 1 tablet (5 mg total) by mouth daily.    Dispense:  90 tablet    Refill:  3    Orders Placed This Encounter  Procedures    Flu vaccine trivalent PF, 6mos and older(Flulaval,Afluria,Fluarix,Fluzone)   CBC with Differential/Platelet   Basic metabolic panel with GFR   TSH    Patient Instructions  Flu shot today Labs today.  EKG today.  BP was too high - start amlodipine  5mg  daily. If persistently high blood pressures after 10 days, may increase amlodipine  to 10mg  daily.  Return in 3-4 weeks for hypertension follow up with PCP Dr Watt.   Check with psychiatry about Vyvanse  use in hypertension.   Your goal blood pressure is <140/90, ideally even lower. Work on low salt/sodium diet - goal <2 grams (2,000mg ) per day. Start monitoring blood pressures at home with XL arm cuff, record readings using blood pressure log sheet provided today.  Eat a diet high in fruits/vegetables and whole grains.  Look into mediterranean and DASH diet. Goal activity is 114min/wk of moderate intensity exercise.  This can be split into 30 minute chunks.  If you are not at this level, you can start with smaller 10-15 min increments and slowly build up activity. Look at www.heart.org for more resources   Follow up plan: Return in about 3 weeks (around 06/29/2024), or if symptoms worsen or fail to improve, for follow up visit.  Anton Blas, MD

## 2024-06-08 NOTE — Patient Instructions (Addendum)
 Flu shot today Labs today.  EKG today.  BP was too high - start amlodipine  5mg  daily. If persistently high blood pressures after 10 days, may increase amlodipine  to 10mg  daily.  Return in 3-4 weeks for hypertension follow up with PCP Dr Watt.   Check with psychiatry about Vyvanse  use in hypertension.   Your goal blood pressure is <140/90, ideally even lower. Work on low salt/sodium diet - goal <2 grams (2,000mg ) per day. Start monitoring blood pressures at home with XL arm cuff, record readings using blood pressure log sheet provided today.  Eat a diet high in fruits/vegetables and whole grains.  Look into mediterranean and DASH diet. Goal activity is 153min/wk of moderate intensity exercise.  This can be split into 30 minute chunks.  If you are not at this level, you can start with smaller 10-15 min increments and slowly build up activity. Look at www.heart.org for more resources

## 2024-06-08 NOTE — Assessment & Plan Note (Addendum)
 Noted on baseline EKG obtained for hypertension.  Denies symptoms of OSA.  No known respiratory history.  Could consider further OSA eval.

## 2024-06-09 ENCOUNTER — Ambulatory Visit: Payer: Self-pay | Admitting: Family Medicine

## 2024-06-09 DIAGNOSIS — D509 Iron deficiency anemia, unspecified: Secondary | ICD-10-CM

## 2024-06-14 NOTE — Addendum Note (Signed)
 Addended by: SEBASTIAN DANNA GRADE on: 06/14/2024 08:45 AM   Modules accepted: Orders

## 2024-06-20 ENCOUNTER — Telehealth (INDEPENDENT_AMBULATORY_CARE_PROVIDER_SITE_OTHER): Admitting: Adult Health

## 2024-06-20 ENCOUNTER — Encounter: Payer: Self-pay | Admitting: Adult Health

## 2024-06-20 DIAGNOSIS — E282 Polycystic ovarian syndrome: Secondary | ICD-10-CM

## 2024-06-20 DIAGNOSIS — F909 Attention-deficit hyperactivity disorder, unspecified type: Secondary | ICD-10-CM | POA: Diagnosis not present

## 2024-06-20 DIAGNOSIS — F329 Major depressive disorder, single episode, unspecified: Secondary | ICD-10-CM

## 2024-06-20 DIAGNOSIS — F411 Generalized anxiety disorder: Secondary | ICD-10-CM | POA: Diagnosis not present

## 2024-06-20 DIAGNOSIS — F5081 Binge eating disorder, mild: Secondary | ICD-10-CM

## 2024-06-20 MED ORDER — LISDEXAMFETAMINE DIMESYLATE 40 MG PO CAPS
40.0000 mg | ORAL_CAPSULE | ORAL | 0 refills | Status: DC
Start: 2024-06-20 — End: 2024-07-04

## 2024-06-20 NOTE — Progress Notes (Signed)
 Meredith Castillo [id number] May 26, 1981 43 y.o.  Virtual Visit via Video Note  I connected with pt @ on 06/20/24 at  2:00 PM EDT by a video enabled telemedicine application and verified that I am speaking with the correct person using two identifiers.   I discussed the limitations of evaluation and management by telemedicine and the availability of in person appointments. The patient expressed understanding and agreed to proceed.  I discussed the assessment and treatment plan with the patient. The patient was provided an opportunity to ask questions and all were answered. The patient agreed with the plan and demonstrated an understanding of the instructions.   The patient was advised to call back or seek an in-person evaluation if the symptoms worsen or if the condition fails to improve as anticipated.  I provided 25 minutes of non-face-to-face time during this encounter.  The patient was located at home.  The provider was located at [facility].   Angeline LOISE Sayers, NP   Subjective:   Patient ID:  Meredith Castillo is a 43 y.o. (DOB [birth year]) female.  Chief Complaint: No chief complaint on file.   HPI Meredith Castillo presents for follow-up of PCOS, MDD, GAD, ADD.  Describes mood today as ok. Pleasant. Mood symptoms -  denies depression, anxiety and irritability. Reports stable interest and motivation. Denies panic attacks. Reports less worry. Denies rumination and over thinking. Reports mood is stable. Stating I feel like I'm doing ok. Taking medications as prescribed.  Energy levels stable - tired sometimes. Active, does not have a regular exercise routine.  Enjoys some usual interests and activities. Widowed. Lives alone with 2 daughters 93 and 38 - dog. Husband's family local and supportive. Appetite adequate. Weight stable - 280 pounds. Reports not sleeping as well. Averages 6 to 8 hours with Trazadone. Focus and concentration stable. Completing tasks. Managing aspects  of household. Recently received a job offer from the city of [place]. Denies SI or HI.  Denies AH or VH. Denies self harm. Denies substance use.  Previous medication trials: Unknown  Review of Systems:  Review of Systems  Musculoskeletal:  Negative for gait problem.  Neurological:  Negative for tremors.  Psychiatric/Behavioral:         Please refer to HPI    Medications: I have reviewed the patient's current medications.  Current Outpatient Medications  Medication Sig Dispense Refill   albuterol  (PROVENTIL  HFA;VENTOLIN  HFA) 108 (90 BASE) MCG/ACT inhaler Inhale 2 puffs into the lungs every 4 (four) hours as needed for wheezing or shortness of breath. 1 Inhaler 3   amLODipine  (NORVASC ) 5 MG tablet Take 1 tablet (5 mg total) by mouth daily. 90 tablet 3   buprenorphine (BUTRANS) 7.5 MCG/HR Place 1 patch onto the skin once a week.     cetirizine (ZYRTEC) 10 MG tablet Take 10 mg by mouth daily.     clobetasol ointment (TEMOVATE) 0.05 % Apply topically 2 (two) times daily as needed.     fluticasone  (FLONASE ) 50 MCG/ACT nasal spray Place 1 spray into both nostrils daily as needed for allergies.     folic acid (FOLVITE) 1 MG tablet Take 1 mg by mouth daily. (Patient not taking: Reported on 06/08/2024)     gabapentin (NEURONTIN) 300 MG capsule TAKE 1 CAPSULE BY MOUTH THREE TIMES A DAY AS DIRECTED (INCREASE IN FREQUENCY)     ibuprofen  (ADVIL ,MOTRIN ) 200 MG tablet Take 400-800 mg by mouth every 8 (eight) hours as needed for headache or moderate pain.  levonorgestrel  (MIRENA) 20 MCG/24HR IUD 1 each by Intrauterine route once.     lisdexamfetamine (VYVANSE ) 40 MG capsule Take 1 capsule (40 mg total) by mouth every morning. 30 capsule 0   lisdexamfetamine (VYVANSE ) 40 MG capsule Take 1 capsule (40 mg total) by mouth every morning. 30 capsule 0   lisdexamfetamine (VYVANSE ) 40 MG capsule Take 1 capsule (40 mg total) by mouth every morning. 30 capsule 0   Multiple Vitamins-Minerals (MULTIVITAMIN  WITH MINERALS) tablet Take 1 tablet by mouth daily.     mupirocin  ointment (BACTROBAN ) 2 % Place 1 Application into the nose as needed (for wound care). 22 g 1   PARoxetine  (PAXIL ) 30 MG tablet Take 1 tablet (30 mg total) by mouth daily at 10 pm. 90 tablet 3   promethazine  (PHENERGAN ) 25 MG tablet TAKE 1 TABLET BY MOUTH EVERY 6 HOURS AS NEEDED FOR NAUSEA AND VOMITING 30 tablet 2   sodium fluoride (PREVIDENT 5000 DRY MOUTH) 1.1 % GEL dental gel Use Prevident 5000 once daily instead of the regular toothpaste (preferably at bed time). Apply a pea-sized amount onto a soft-toothbrush. Brush teeth for 2 minutes. Spit out thoroughly the toothpaste but do not rinse, eat or drink for 30 min. DO NOT SWALLOW IT.     traZODone  (DESYREL ) 50 MG tablet TAKE 1 TO 2 TABLETS BY MOUTH EVERYDAY AT BEDTIME 180 tablet 3   valACYclovir  (VALTREX ) 1000 MG tablet TAKE 2 TABLETS (2,000 MG TOTAL) BY MOUTH TWICE A DAY 20 tablet 2   No current facility-administered medications for this visit.    Medication Side Effects: None  Allergies:  Allergies  Allergen Reactions   Vancomycin Other (See Comments)    Patient states that when this medication is given intravenously, it causes her kidney's to shut down.   Neosporin [Neomycin-Polymyxin-Gramicidin] Other (See Comments)    Patient states that this medication causes a poison ivy rash like reaction.    Past Medical History:  Diagnosis Date   Asthma    Bariatric surgery status complicating pregnancy, childbirth, or the puerperium, antepartum condition or complication 2008   HTN (hypertension)    Iron deficiency anemia    due to gastric bypass.    Mild asthma 01/10/2014   Morbid obesity with BMI of 40.0-44.9, adult (HCC)    PCOS (polycystic ovarian syndrome)    PP care - s/p SVD 2/19 11/19/2011   Unspecified vitamin D deficiency    Vitamin B12 deficiency    due to gastric bypass.     Family History  Problem Relation Age of Onset   Hypertension Mother    Diabetes  Mother    Heart disease Mother    Cancer Maternal Grandfather        brain   Heart attack Paternal Grandfather    Anesthesia problems Neg Hx     Social History   Socioeconomic History   Marital status: Single    Spouse name: Not on file   Number of children: 2   Years of education: Not on file   Highest education level: Not on file  Occupational History    Comment: [employer] employee.   Tobacco Use   Smoking status: Former    Current packs/day: 0.00    Types: Cigarettes    Quit date: 10/12/2005    Years since quitting: 18.7   Smokeless tobacco: Never  Substance and Sexual Activity   Alcohol use: No    Alcohol/week: 0.0 standard drinks of alcohol   Drug use: Yes  Sexual activity: Not Currently    Partners: Male    Birth control/protection: I.U.D.    Comment: widow  Other Topics Concern   Not on file  Social History Narrative   Widowed 2016- husband had pulmonary disease   2 kids.    Social Drivers of Corporate investment banker Strain: Low Risk  (01/27/2023)   Received from [facility]   Overall Financial Resource Strain (CARDIA)    Difficulty of Paying Living Expenses: Not very hard  Food Insecurity: No Food Insecurity (06/09/2024)   Received [facility]   Hunger Vital Sign    Within the past 12 months, you worried that your food would run out before you got the money to buy more.: Never true    Within the past 12 months, the food you bought just didn't last and you didn't have money to get more.: Never true  Transportation Needs: No Transportation Needs (06/09/2024)   Received from [facility] - [facility]    In the past 12 months, has lack of transportation kept you from medical appointments or from getting medications?: No    In the past 12 months, has lack of transportation kept you from meetings, work, or from getting things needed for daily living?: No  Physical Activity: Not on file  Stress: Not on file  Social Connections:  Unknown (01/26/2022)   [facility] from [facility]   Social Network    Social Network: Not on file  Intimate Partner Violence: Not At Risk (06/09/2024)   Received from [facility]   HITS    Over the last 12 months how often did your partner physically hurt you?: Never    Over the last 12 months how often did your partner insult you or talk down to you?: Never    Over the last 12 months how often did your partner threaten you with physical harm?: Never    Over the last 12 months how often did your partner scream or curse at you?: Never    Past Medical History, Surgical history, Social history, and Family history were reviewed and updated as appropriate.   Please see review of systems for further details on the patient's review from today.   Objective:   Physical Exam:  There were no vitals taken for this visit.  Physical Exam Constitutional:      General: She is not in acute distress. Musculoskeletal:        General: No deformity.  Neurological:     Mental Status: She is alert and oriented to person, place, and time.     Coordination: Coordination normal.  Psychiatric:        Attention and Perception: Attention and perception normal. She does not perceive auditory or visual hallucinations.        Mood and Affect: Mood normal. Mood is not anxious or depressed. Affect is not labile, blunt, angry or inappropriate.        Speech: Speech normal.        Behavior: Behavior normal.        Thought Content: Thought content normal. Thought content is not paranoid or delusional. Thought content does not include homicidal or suicidal ideation. Thought content does not include homicidal or suicidal plan.        Cognition and Memory: Cognition and memory normal.        Judgment: Judgment normal.     Comments: Insight intact     Lab Review:     Component  Value Date/Time   NA 137 06/08/2024 1303   NA 140 01/05/2013 0939   K 4.1 06/08/2024 1303   K 4.7 01/05/2013 0939   CL 99 06/08/2024  1303   CL 106 01/05/2013 0939   CO2 31 06/08/2024 1303   CO2 27 01/05/2013 0939   GLUCOSE 87 06/08/2024 1303   GLUCOSE 75 01/05/2013 0939   BUN 8 06/08/2024 1303   BUN 11.7 01/05/2013 0939   CREATININE 0.67 06/08/2024 1303   CREATININE 0.8 01/05/2013 0939   CALCIUM  8.9 06/08/2024 1303   CALCIUM  9.4 01/05/2013 0939   PROT 6.8 10/19/2019 1033   PROT 7.5 01/05/2013 0939   ALBUMIN 4.0 10/19/2019 1033   ALBUMIN 3.8 01/05/2013 0939   AST 22 10/19/2019 1033   AST 12 01/05/2013 0939   ALT 14 10/19/2019 1033   ALT 13 01/05/2013 0939   ALKPHOS 79 10/19/2019 1033   ALKPHOS 103 01/05/2013 0939   BILITOT 0.5 10/19/2019 1033   BILITOT 0.52 01/05/2013 0939   GFRNONAA >60 08/04/2016 1146   GFRAA >60 08/04/2016 1146       Component Value Date/Time   WBC 7.9 06/08/2024 1303   RBC 4.41 06/08/2024 1303   HGB 10.3 (L) 06/08/2024 1303   HGB 13.1 09/05/2013 1552   HCT 33.4 (L) 06/08/2024 1303   HCT 40.1 09/05/2013 1552   PLT 330.0 06/08/2024 1303   PLT 257 09/05/2013 1552   MCV 75.7 (L) 06/08/2024 1303   MCV 88.7 09/05/2013 1552   MCH 22.9 (L) 08/04/2016 1146   MCHC 31.0 06/08/2024 1303   RDW 18.4 (H) 06/08/2024 1303   RDW 13.9 09/05/2013 1552   LYMPHSABS 1.8 06/08/2024 1303   LYMPHSABS 2.7 09/05/2013 1552   MONOABS 0.5 06/08/2024 1303   MONOABS 0.6 09/05/2013 1552   EOSABS 0.2 06/08/2024 1303   EOSABS 0.4 09/05/2013 1552   BASOSABS 0.1 06/08/2024 1303   BASOSABS 0.0 09/05/2013 1552    No results found for: POCLITH, LITHIUM   No results found for: PHENYTOIN, PHENOBARB, VALPROATE, CBMZ   .res Assessment: Plan:    Plan:  PDMP reviewed  Trazadone 50mg  at bedtime Paxil  30 mg every evening at bedtime  Vyvanse  40 mg every morning   Monitor BP between visits while taking stimulant medication.   RTC 2 weeks  25 minutes spent dedicated to the care of this patient on the date of this encounter to include pre-visit review of records, ordering of medication, post  visit documentation, and face-to-face time with the patient discussing PCOS, MDD, GAD, ADD. Discussed continuing current medication regimen.  Patient advised to contact office with any questions, adverse effects, or acute worsening in signs and symptoms.  Discussed potential benefits, risk, and side effects of benzodiazepines to include potential risk of tolerance and dependence, as well as possible drowsiness.  Advised patient not to drive if experiencing drowsiness and to take lowest possible effective dose to minimize risk of dependence and tolerance.  Discussed potential benefits, risks, and side effects of stimulants with patient to include increased heart rate, palpitations, insomnia, increased anxiety, increased irritability, or decreased appetite.  Instructed patient to contact office if experiencing any significant tolerability issues. There are no diagnoses linked to this encounter.   Please see After Visit Summary for patient specific instructions.  Future Appointments  Date Time Provider Department Center  06/20/2024  2:00 PM Gibbs Naugle Nattalie, NP [facility] None  06/29/2024  2:40 PM Copland, Jacques, MD [id number] [id number]    No orders of the defined types  were placed in this encounter.     -------------------------------

## 2024-06-27 NOTE — Progress Notes (Signed)
 "    Meredith Castillo T. Kennetha Pearman, MD, CAQ Sports Medicine [facility] at [facility] [address] [place], [address]  Phone: [phone]  FAX: [phone]  Meredith Castillo - 43 y.o. female  MRN [id number]  Date of Birth: [birth year]  Date: 06/29/2024  PCP: Watt Mirza, MD  Referral: Watt Mirza, MD  Chief Complaint  Patient presents with   Hypertension    Follow Up   Anemia   Subjective:   Meredith Castillo is a 43 y.o. very pleasant female patient with Body mass index is 49.24 kg/m. who presents with the following:  Discussed the use of AI scribe software for clinical note transcription with the patient, who gave verbal consent to proceed.  Patient is here follow-up anemia.  Hemoglobin 10.3.  HTN: Tolerating all medications without side effects Stable and at goal No CP, no sob. No HA.  BP Readings from Last 3 Encounters:  06/29/24 (!) 144/94  06/08/24 (!) 160/98  12/19/22 (!) 140/98    Basic Metabolic Panel:    Component Value Date/Time   NA 137 06/08/2024 1303   NA 140 01/05/2013 0939   K 4.1 06/08/2024 1303   K 4.7 01/05/2013 0939   CL 99 06/08/2024 1303   CL 106 01/05/2013 0939   CO2 31 06/08/2024 1303   CO2 27 01/05/2013 0939   BUN 8 06/08/2024 1303   BUN 11.7 01/05/2013 0939   CREATININE 0.67 06/08/2024 1303   CREATININE 0.8 01/05/2013 0939   GLUCOSE 87 06/08/2024 1303   GLUCOSE 75 01/05/2013 0939   CALCIUM  8.9 06/08/2024 1303   CALCIUM  9.4 01/05/2013 0939    Wt Readings from Last 3 Encounters:  06/29/24 (!) 319 lb 2 oz (144.8 kg)  06/08/24 (!) 315 lb (142.9 kg)  12/19/22 (!) 311 lb 2 oz (141.1 kg)     Colonoscopy  Imm record History of Present Illness Meredith Castillo is a 42 year old female with hypertension who presents with elevated blood pressure and a history of remote gastric bypass.  Her blood pressure has been elevated over the past two to three months, initially noted during a visit to the [facility]  where it was recorded at 175/unknown, leading to a postponement of her dental work. Despite being on amlodipine  and discontinuing Vyvanse , her blood pressure remains high. She monitors her blood pressure at home and shares these readings with her healthcare provider. She has a family history of hypertension, with several relatives taking lisinopril .  She is currently taking Paxil , trazodone , and gabapentin for chronic back pain related to L5-S1-S2 issues. She receives nerve ablation every six months and has had various injections in the past. She also takes buprenorphine for pain management, not for opioid dependency.  She has a history of recurrent anemia, previously requiring high-dose iron supplements and IV iron infusions. She is concerned about her ferritin levels. She underwent Roux-en-Y gastric bypass surgery in 2007 or 2008, which may affect her absorption of certain nutrients. She is currently taking an over-the-counter iron supplement with vitamin C to aid absorption.  She recently lost her job of 20 years at [employer] and has been experiencing stress related to job searching. She has accepted a new position with the [employer] starting October 20th.  She has not had a physical exam in years.  Health Maintenance  Topic Date Due   Hepatitis C Screening  Never done   DTaP/Tdap/Td (1 - Tdap) Never done   Hepatitis  B Vaccines 19-59 Average Risk (1 of 3 - 19+ 3-dose series) Never done   HPV VACCINES (1 - Risk 3-dose SCDM series) Never done   Mammogram  Never done   Colonoscopy  Never done   COVID-19 Vaccine (4 - 2025-26 season) 05/30/2024   Cervical Cancer Screening (HPV/Pap Cotest)  04/09/2025   Influenza Vaccine  Completed   HIV Screening  Completed   Pneumococcal Vaccine  Aged Out   Meningococcal B Vaccine  Aged Out    Immunization History  Administered Date(s) Administered   Influenza Inj Mdck Quad Pf 08/21/2018, 08/17/2019, 07/12/2022   Influenza,  Seasonal, Injecte, Preservative Fre 06/08/2024   Influenza,inj,Quad PF,6+ Mos 07/19/2014, 07/02/2015, 08/05/2016   Influenza-Unspecified 08/04/2021   Moderna Covid-19 Fall Seasonal Vaccine 74yrs & older 07/12/2022   PFIZER(Purple Top)SARS-COV-2 Vaccination 12/20/2019, 01/10/2020     Review of Systems is noted in the HPI, as appropriate  Objective:   BP (!) 144/94   Pulse 100   Temp 98.2 F (36.8 C) (Temporal)   Ht 5' 7.5 (1.715 m)   Wt (!) 319 lb 2 oz (144.8 kg)   SpO2 99%   BMI 49.24 kg/m   GEN: No acute distress; alert,appropriate. CV: RRR, no m/g/r  PULM: Breathing comfortably in no respiratory distress PSYCH: Normally interactive.   Laboratory and Imaging Data:  Assessment and Plan:     ICD-10-CM   1. Other iron deficiency anemia  D50.8 IBC + Ferritin    CANCELED: IBC + Ferritin    2. Colon cancer screening  Z12.11 Ambulatory referral to Gastroenterology    3. Screening for diabetes mellitus  Z13.1 Hemoglobin A1c    CANCELED: Hemoglobin A1c    4. Screening, lipid  Z13.220 Lipid panel    CANCELED: Lipid panel    5. Encounter for long-term current use of medication  Z79.899 Hepatic function panel    T3, free    T4, free    Vitamin B12    VITAMIN D  25 Hydroxy (Vit-D Deficiency, Fractures)    CANCELED: Hepatic function panel    CANCELED: T4, free    CANCELED: T3, free    CANCELED: Vitamin B12    CANCELED: VITAMIN D  25 Hydroxy (Vit-D Deficiency, Fractures)    6. History of Roux-en-Y gastric bypass  Z98.84 T3, free    T4, free    Vitamin B12    VITAMIN D  25 Hydroxy (Vit-D Deficiency, Fractures)    CANCELED: T4, free    CANCELED: T3, free    CANCELED: Vitamin B12    CANCELED: VITAMIN D  25 Hydroxy (Vit-D Deficiency, Fractures)     She is really behind on virtually all health maintenance.  Strongly suggest follow-up complete physical. Assessment & Plan Hypertension Hypertension remains elevated despite discontinuation of Vyvanse .  Will discontinue  amlodipine  and start ACE inhibitor- Switch to lisinopril  20 mg daily. - Monitor blood pressure daily for two weeks. - Increase lisinopril  to 40 mg daily if blood pressure remains high. - Communicate blood pressure readings via MyChart.  Iron deficiency anemia Recurrent iron deficiency anemia with potential gastrointestinal blood loss. Colonoscopy recommended due to age and anemia. Possible malabsorption from Roux-en-Y gastric bypass. - Refer for colonoscopy. - Ensure intake of 325 mg iron daily - Follow up with ferritin and iron levels. - Check B12 and vitamin D  levels.  Chronic low back pain due to lumbar spine disease Chronic low back pain managed with buprenorphine, gabapentin, and nerve ablation.  She is following with pain management.  General Health Maintenance Behind  on vaccinations and screenings. Needs immunization records for new job. - Provide immunization records. - Schedule physical examination. - Schedule fasting labs for cholesterol and blood sugar, and vitamin deficiencies.  Medication Management during today's office visit: Meds ordered this encounter  Medications   lisinopril  (ZESTRIL ) 20 MG tablet    Sig: Take 1 tablet (20 mg total) by mouth daily.    Dispense:  30 tablet    Refill:  2   Medications Discontinued During This Encounter  Medication Reason   folic acid (FOLVITE) 1 MG tablet Completed Course   amLODipine  (NORVASC ) 5 MG tablet     Orders placed today for conditions managed today: Orders Placed This Encounter  Procedures   Hemoglobin A1c   Hepatic function panel   IBC + Ferritin   Lipid panel   T3, free   T4, free   Vitamin B12   VITAMIN D  25 Hydroxy (Vit-D Deficiency, Fractures)   Ambulatory referral to Gastroenterology    Disposition: Return for full physical.  Dragon [employer] One speech-to-text software was used for transcription in this dictation.  Possible transcriptional errors can occur using Animal nutritionist.   Signed,  Jacques DASEN. Arbor Leer, MD   Outpatient Encounter Medications as of 06/29/2024  Medication Sig   albuterol  (PROVENTIL  HFA;VENTOLIN  HFA) 108 (90 BASE) MCG/ACT inhaler Inhale 2 puffs into the lungs every 4 (four) hours as needed for wheezing or shortness of breath.   buprenorphine (BUTRANS) 7.5 MCG/HR Place 1 patch onto the skin once a week.   cetirizine (ZYRTEC) 10 MG tablet Take 10 mg by mouth daily.   clobetasol ointment (TEMOVATE) 0.05 % Apply topically 2 (two) times daily as needed.   fluticasone  (FLONASE ) 50 MCG/ACT nasal spray Place 1 spray into both nostrils daily as needed for allergies.   gabapentin (NEURONTIN) 300 MG capsule TAKE 1 CAPSULE BY MOUTH THREE TIMES A DAY AS DIRECTED (INCREASE IN FREQUENCY)   ibuprofen  (ADVIL ,MOTRIN ) 200 MG tablet Take 400-800 mg by mouth every 8 (eight) hours as needed for headache or moderate pain.    levonorgestrel  (MIRENA) 20 MCG/24HR IUD 1 each by Intrauterine route once.   lisdexamfetamine  (VYVANSE ) 40 MG capsule Take 1 capsule (40 mg total) by mouth every morning.   lisdexamfetamine  (VYVANSE ) 40 MG capsule Take 1 capsule (40 mg total) by mouth every morning.   lisdexamfetamine  (VYVANSE ) 40 MG capsule Take 1 capsule (40 mg total) by mouth every morning.   lisinopril  (ZESTRIL ) 20 MG tablet Take 1 tablet (20 mg total) by mouth daily.   Multiple Vitamins-Minerals (MULTIVITAMIN WITH MINERALS) tablet Take 1 tablet by mouth daily.   mupirocin  ointment (BACTROBAN ) 2 % Place 1 Application into the nose as needed (for wound care).   PARoxetine  (PAXIL ) 30 MG tablet Take 1 tablet (30 mg total) by mouth daily at 10 pm.   promethazine  (PHENERGAN ) 25 MG tablet TAKE 1 TABLET BY MOUTH EVERY 6 HOURS AS NEEDED FOR NAUSEA AND VOMITING   sodium fluoride (PREVIDENT 5000 DRY MOUTH) 1.1 % GEL dental gel Use Prevident 5000 once daily instead of the regular toothpaste (preferably at bed time). Apply a pea-sized amount onto a soft-toothbrush. Brush teeth for 2 minutes. Spit out thoroughly  the toothpaste but do not rinse, eat or drink for 30 min. DO NOT SWALLOW IT.   traZODone  (DESYREL ) 50 MG tablet TAKE 1 TO 2 TABLETS BY MOUTH EVERYDAY AT BEDTIME   valACYclovir  (VALTREX ) 1000 MG tablet TAKE 2 TABLETS (2,000 MG TOTAL) BY MOUTH TWICE A DAY   [DISCONTINUED]  amLODipine  (NORVASC ) 5 MG tablet Take 1 tablet (5 mg total) by mouth daily.   [DISCONTINUED] folic acid (FOLVITE) 1 MG tablet Take 1 mg by mouth daily. (Patient not taking: Reported on 06/08/2024)   No facility-administered encounter medications on file as of 06/29/2024.   "

## 2024-06-29 ENCOUNTER — Ambulatory Visit: Admitting: Family Medicine

## 2024-06-29 ENCOUNTER — Encounter: Payer: Self-pay | Admitting: Family Medicine

## 2024-06-29 VITALS — BP 144/94 | HR 100 | Temp 98.2°F | Ht 67.5 in | Wt 319.1 lb

## 2024-06-29 DIAGNOSIS — Z13220 Encounter for screening for lipoid disorders: Secondary | ICD-10-CM | POA: Diagnosis not present

## 2024-06-29 DIAGNOSIS — Z9884 Bariatric surgery status: Secondary | ICD-10-CM

## 2024-06-29 DIAGNOSIS — D508 Other iron deficiency anemias: Secondary | ICD-10-CM | POA: Diagnosis not present

## 2024-06-29 DIAGNOSIS — Z79899 Other long term (current) drug therapy: Secondary | ICD-10-CM

## 2024-06-29 DIAGNOSIS — Z1211 Encounter for screening for malignant neoplasm of colon: Secondary | ICD-10-CM

## 2024-06-29 DIAGNOSIS — Z131 Encounter for screening for diabetes mellitus: Secondary | ICD-10-CM

## 2024-06-29 MED ORDER — LISINOPRIL 20 MG PO TABS: 20.0000 mg | ORAL_TABLET | Freq: Every day | ORAL | 2 refills | Status: DC

## 2024-06-29 NOTE — Patient Instructions (Signed)
 Iron Sulfate 325 mg

## 2024-07-04 ENCOUNTER — Encounter: Payer: Self-pay | Admitting: Adult Health

## 2024-07-04 ENCOUNTER — Telehealth (INDEPENDENT_AMBULATORY_CARE_PROVIDER_SITE_OTHER): Admitting: Adult Health

## 2024-07-04 DIAGNOSIS — F411 Generalized anxiety disorder: Secondary | ICD-10-CM

## 2024-07-04 DIAGNOSIS — F900 Attention-deficit hyperactivity disorder, predominantly inattentive type: Secondary | ICD-10-CM

## 2024-07-04 DIAGNOSIS — F339 Major depressive disorder, recurrent, unspecified: Secondary | ICD-10-CM | POA: Diagnosis not present

## 2024-07-04 DIAGNOSIS — F3342 Major depressive disorder, recurrent, in full remission: Secondary | ICD-10-CM

## 2024-07-04 DIAGNOSIS — E282 Polycystic ovarian syndrome: Secondary | ICD-10-CM

## 2024-07-04 MED ORDER — LISDEXAMFETAMINE DIMESYLATE 40 MG PO CAPS: 40.0000 mg | ORAL_CAPSULE | ORAL | 0 refills | Status: AC

## 2024-07-04 MED ORDER — LISDEXAMFETAMINE DIMESYLATE 40 MG PO CAPS: 40.0000 mg | ORAL_CAPSULE | ORAL | 0 refills | Status: DC

## 2024-07-04 NOTE — Progress Notes (Signed)
 Meredith Castillo [id number] 03/28/1981 43 y.o.  Virtual Visit via Video Note  I connected with pt @ on 07/04/24 at  9:30 AM EDT by a video enabled telemedicine application and verified that I am speaking with the correct person using two identifiers.   I discussed the limitations of evaluation and management by telemedicine and the availability of in person appointments. The patient expressed understanding and agreed to proceed.  I discussed the assessment and treatment plan with the patient. The patient was provided an opportunity to ask questions and all were answered. The patient agreed with the plan and demonstrated an understanding of the instructions.   The patient was advised to call back or seek an in-person evaluation if the symptoms worsen or if the condition fails to improve as anticipated.  I provided 25 minutes of non-face-to-face time during this encounter.  The patient was located at home.  The provider was located at [facility].   Angeline LOISE Sayers, NP   Subjective:   Patient ID:  Meredith Castillo is a 43 y.o. (DOB [birth year]) female.  Chief Complaint: No chief complaint on file.   HPI Meredith Castillo presents for follow-up of PCOS, MDD, GAD, ADD.  Describes mood today as ok. Pleasant. Mood symptoms -  denies depression, anxiety and irritability. Reports stable interest and motivation. Denies panic attacks. Reports some worry - financial. Denies rumination and over thinking. Reports mood is stable. Stating I feel like I'm doing alright. Taking medications as prescribed.  Energy levels stable - tired sometimes. Active, does not have a regular exercise routine.  Enjoys some usual interests and activities. Widowed. Lives alone with 2 daughters - dog. Husband's family local and supportive. Appetite adequate. Weight gain - 300 pounds. Reports sleeping better. Averages 6 to 8 hours with Trazadone. Focus and concentration stable. Completing tasks. Managing aspects  of household. Recently received a job offer from the city of [place] - starts 07/18/2024. Denies SI or HI.  Denies AH or VH. Denies self harm. Denies substance use.  Previous medication trials: Unknown   Review of Systems:  Review of Systems  Musculoskeletal:  Negative for gait problem.  Neurological:  Negative for tremors.  Psychiatric/Behavioral:         Please refer to HPI    Medications: I have reviewed the patient's current medications.  Current Outpatient Medications  Medication Sig Dispense Refill   albuterol  (PROVENTIL  HFA;VENTOLIN  HFA) 108 (90 BASE) MCG/ACT inhaler Inhale 2 puffs into the lungs every 4 (four) hours as needed for wheezing or shortness of breath. 1 Inhaler 3   buprenorphine (BUTRANS) 7.5 MCG/HR Place 1 patch onto the skin once a week.     cetirizine (ZYRTEC) 10 MG tablet Take 10 mg by mouth daily.     clobetasol ointment (TEMOVATE) 0.05 % Apply topically 2 (two) times daily as needed.     fluticasone  (FLONASE ) 50 MCG/ACT nasal spray Place 1 spray into both nostrils daily as needed for allergies.     gabapentin (NEURONTIN) 300 MG capsule TAKE 1 CAPSULE BY MOUTH THREE TIMES A DAY AS DIRECTED (INCREASE IN FREQUENCY)     ibuprofen  (ADVIL ,MOTRIN ) 200 MG tablet Take 400-800 mg by mouth every 8 (eight) hours as needed for headache or moderate pain.      levonorgestrel  (MIRENA) 20 MCG/24HR IUD 1 each by Intrauterine route once.     lisdexamfetamine (VYVANSE ) 40 MG capsule Take 1 capsule (40 mg total) by mouth every morning. 30 capsule 0   lisdexamfetamine (VYVANSE ) 40  MG capsule Take 1 capsule (40 mg total) by mouth every morning. 30 capsule 0   lisdexamfetamine (VYVANSE ) 40 MG capsule Take 1 capsule (40 mg total) by mouth every morning. 30 capsule 0   lisinopril (ZESTRIL) 20 MG tablet Take 1 tablet (20 mg total) by mouth daily. 30 tablet 2   Multiple Vitamins-Minerals (MULTIVITAMIN WITH MINERALS) tablet Take 1 tablet by mouth daily.     mupirocin  ointment  (BACTROBAN ) 2 % Place 1 Application into the nose as needed (for wound care). 22 g 1   PARoxetine  (PAXIL ) 30 MG tablet Take 1 tablet (30 mg total) by mouth daily at 10 pm. 90 tablet 3   promethazine  (PHENERGAN ) 25 MG tablet TAKE 1 TABLET BY MOUTH EVERY 6 HOURS AS NEEDED FOR NAUSEA AND VOMITING 30 tablet 2   sodium fluoride (PREVIDENT 5000 DRY MOUTH) 1.1 % GEL dental gel Use Prevident 5000 once daily instead of the regular toothpaste (preferably at bed time). Apply a pea-sized amount onto a soft-toothbrush. Brush teeth for 2 minutes. Spit out thoroughly the toothpaste but do not rinse, eat or drink for 30 min. DO NOT SWALLOW IT.     traZODone  (DESYREL ) 50 MG tablet TAKE 1 TO 2 TABLETS BY MOUTH EVERYDAY AT BEDTIME 180 tablet 3   valACYclovir  (VALTREX ) 1000 MG tablet TAKE 2 TABLETS (2,000 MG TOTAL) BY MOUTH TWICE A DAY 20 tablet 2   No current facility-administered medications for this visit.    Medication Side Effects: None  Allergies:  Allergies  Allergen Reactions   Vancomycin Other (See Comments)    Patient states that when this medication is given intravenously, it causes her kidney's to shut down.   Neosporin [Neomycin-Polymyxin-Gramicidin] Other (See Comments)    Patient states that this medication causes a poison ivy rash like reaction.    Past Medical History:  Diagnosis Date   Asthma    Bariatric surgery status complicating pregnancy, childbirth, or the puerperium, antepartum condition or complication 2008   HTN (hypertension)    Iron deficiency anemia    due to gastric bypass.    Mild asthma 01/10/2014   Morbid obesity with BMI of 40.0-44.9, adult (HCC)    PCOS (polycystic ovarian syndrome)    PP care - s/p SVD 2/19 11/19/2011   Unspecified vitamin D deficiency    Vitamin B12 deficiency    due to gastric bypass.     Family History  Problem Relation Age of Onset   Hypertension Mother    Diabetes Mother    Heart disease Mother    Cancer Maternal Grandfather         brain   Heart attack Paternal Grandfather    Anesthesia problems Neg Hx     Social History   Socioeconomic History   Marital status: Single    Spouse name: Not on file   Number of children: 2   Years of education: Not on file   Highest education level: Not on file  Occupational History    Comment: [employer] employee.   Tobacco Use   Smoking status: Former    Current packs/day: 0.00    Types: Cigarettes    Quit date: 10/12/2005    Years since quitting: 18.7   Smokeless tobacco: Never  Substance and Sexual Activity   Alcohol use: No    Alcohol/week: 0.0 standard drinks of alcohol   Drug use: Yes   Sexual activity: Not Currently    Partners: Male    Birth control/protection: I.U.D.    Comment:  widow  Other Topics Concern   Not on file  Social History Narrative   Widowed 2016- husband had pulmonary disease   2 kids.    Social Drivers of Corporate investment banker Strain: Low Risk  (01/27/2023)   Received from [facility]   Overall Financial Resource Strain (CARDIA)    Difficulty of Paying Living Expenses: Not very hard  Food Insecurity: No Food Insecurity (06/09/2024)   Received from [facility]   Hunger Vital Sign    Within the past 12 months, you worried that your food would run out before you got the money to buy more.: Never true    Within the past 12 months, the food you bought just didn't last and you didn't have money to get more.: Never true  Transportation Needs: No Transportation Needs (06/09/2024)   Received from [facility] - Transportation    In the past 12 months, has lack of transportation kept you from medical appointments or from getting medications?: No    In the past 12 months, has lack of transportation kept you from meetings, work, or from getting things needed for daily living?: No  Physical Activity: Not on file  Stress: Not on file  Social Connections: Unknown (01/26/2022)   [facility]   Social Network     Social Network: Not on file  Intimate Partner Violence: Not At Risk (06/09/2024)   Received from [facility]   HITS    Over the last 12 months how often did your partner physically hurt you?: Never    Over the last 12 months how often did your partner insult you or talk down to you?: Never    Over the last 12 months how often did your partner threaten you with physical harm?: Never    Over the last 12 months how often did your partner scream or curse at you?: Never    Past Medical History, Surgical history, Social history, and Family history were reviewed and updated as appropriate.   Please see review of systems for further details on the patient's review from today.   Objective:   Physical Exam:  There were no vitals taken for this visit.  Physical Exam Constitutional:      General: She is not in acute distress. Musculoskeletal:        General: No deformity.  Neurological:     Mental Status: She is alert and oriented to person, place, and time.     Coordination: Coordination normal.  Psychiatric:        Attention and Perception: Attention and perception normal. She does not perceive auditory or visual hallucinations.        Mood and Affect: Mood normal. Mood is not anxious or depressed. Affect is not labile, blunt, angry or inappropriate.        Speech: Speech normal.        Behavior: Behavior normal.        Thought Content: Thought content normal. Thought content is not paranoid or delusional. Thought content does not include homicidal or suicidal ideation. Thought content does not include homicidal or suicidal plan.        Cognition and Memory: Cognition and memory normal.        Judgment: Judgment normal.     Comments: Insight intact     Lab Review:     Component Value Date/Time   NA 137 06/08/2024 1303   NA 140 01/05/2013 0939   K 4.1 06/08/2024  1303   K 4.7 01/05/2013 0939   CL 99 06/08/2024 1303   CL 106 01/05/2013 0939   CO2 31 06/08/2024 1303   CO2 27  01/05/2013 0939   GLUCOSE 87 06/08/2024 1303   GLUCOSE 75 01/05/2013 0939   BUN 8 06/08/2024 1303   BUN 11.7 01/05/2013 0939   CREATININE 0.67 06/08/2024 1303   CREATININE 0.8 01/05/2013 0939   CALCIUM  8.9 06/08/2024 1303   CALCIUM  9.4 01/05/2013 0939   PROT 6.8 10/19/2019 1033   PROT 7.5 01/05/2013 0939   ALBUMIN 4.0 10/19/2019 1033   ALBUMIN 3.8 01/05/2013 0939   AST 22 10/19/2019 1033   AST 12 01/05/2013 0939   ALT 14 10/19/2019 1033   ALT 13 01/05/2013 0939   ALKPHOS 79 10/19/2019 1033   ALKPHOS 103 01/05/2013 0939   BILITOT 0.5 10/19/2019 1033   BILITOT 0.52 01/05/2013 0939   GFRNONAA >60 08/04/2016 1146   GFRAA >60 08/04/2016 1146       Component Value Date/Time   WBC 7.9 06/08/2024 1303   RBC 4.41 06/08/2024 1303   HGB 10.3 (L) 06/08/2024 1303   HGB 13.1 09/05/2013 1552   HCT 33.4 (L) 06/08/2024 1303   HCT 40.1 09/05/2013 1552   PLT 330.0 06/08/2024 1303   PLT 257 09/05/2013 1552   MCV 75.7 (L) 06/08/2024 1303   MCV 88.7 09/05/2013 1552   MCH 22.9 (L) 08/04/2016 1146   MCHC 31.0 06/08/2024 1303   RDW 18.4 (H) 06/08/2024 1303   RDW 13.9 09/05/2013 1552   LYMPHSABS 1.8 06/08/2024 1303   LYMPHSABS 2.7 09/05/2013 1552   MONOABS 0.5 06/08/2024 1303   MONOABS 0.6 09/05/2013 1552   EOSABS 0.2 06/08/2024 1303   EOSABS 0.4 09/05/2013 1552   BASOSABS 0.1 06/08/2024 1303   BASOSABS 0.0 09/05/2013 1552    No results found for: POCLITH, LITHIUM   No results found for: PHENYTOIN, PHENOBARB, VALPROATE, CBMZ   .res Assessment: Plan:    Plan:  PDMP reviewed  Trazadone 50mg  at bedtime Paxil  30 mg every evening at bedtime  Vyvanse  40 mg every morning   Monitor BP between visits while taking stimulant medication.   RTC 6 months  25 minutes spent dedicated to the care of this patient on the date of this encounter to include pre-visit review of records, ordering of medication, post visit documentation, and face-to-face time with the patient  discussing PCOS, MDD, GAD, ADD. Discussed continuing current medication regimen.  Patient advised to contact office with any questions, adverse effects, or acute worsening in signs and symptoms.  Discussed potential benefits, risk, and side effects of benzodiazepines to include potential risk of tolerance and dependence, as well as possible drowsiness.  Advised patient not to drive if experiencing drowsiness and to take lowest possible effective dose to minimize risk of dependence and tolerance.  Discussed potential benefits, risks, and side effects of stimulants with patient to include increased heart rate, palpitations, insomnia, increased anxiety, increased irritability, or decreased appetite.  Instructed patient to contact office if experiencing any significant tolerability issues.  There are no diagnoses linked to this encounter.   Please see After Visit Summary for patient specific instructions.  Future Appointments  Date Time Provider Department Center  07/04/2024  9:30 AM Tahjae Clausing, Angeline Mattocks, NP [facility] None  08/10/2024  9:40 AM Copland, Jacques, MD [id number] [id number]    No orders of the defined types were placed in this encounter.     -------------------------------

## 2024-08-07 ENCOUNTER — Encounter: Payer: Self-pay | Admitting: Family Medicine

## 2024-08-07 NOTE — Progress Notes (Signed)
 "    Meredith Castillo T. Hendel Gatliff, MD, CAQ Sports Medicine [facility] at [facility] [address] [place], [address]  Phone: [phone]  FAX: [phone]  HOLLYN STUCKY - 43 y.o. female  MRN [id number]  Date of Birth: [birth year]  Date: 08/10/2024  PCP: Watt Mirza, MD  Referral: Watt Mirza, MD  Chief Complaint  Patient presents with   Annual Exam   Patient Care Team: Watt Mirza, MD as PCP - General (Family Medicine) Kandyce Sor, MD as Attending Physician (Obstetrics and Gynecology) Lonn Hicks, MD as Consulting Physician (Hematology and Oncology) Subjective:   Meredith Castillo is a 43 y.o. pleasant patient who presents with the following:  Discussed the use of AI scribe software for clinical note transcription with the patient, who gave verbal consent to proceed.  Meredith Castillo is here for a complete physical.  I have seen the patient for more than 10 years, and to my knowledge she has never had a physical in our office.  She has a history of severe obesity.  She did have gastric bypass in 2008.  Blood work from September 2025 does show anemia with a hemoglobin of 10.3. History of Present Illness Meredith Castillo is a 43 year old female who presents for a general physical exam.  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  She experiences weight fluctuations. She is currently on lisinopril  for hypertension and requests a refill for this medication.  She is due for a tetanus shot and a hepatitis B vaccine, the latter being a requirement for her new job. She recalls having tetanus shots in the past but is unsure of the dates.  She is also due for a colon cancer screening.  Regarding her respiratory health, she recently had a cold, which was not COVID-19, and requests a  refill for her Ventolin  inhaler. She uses the inhaler occasionally for wheezing and coughing, particularly during colds or allergy attacks. She reports a sore throat, headache, and feeling run down during the recent cold.  She is on buprenorphine for pain management, having switched from hydrocodone  to avoid fluctuations in pain relief. She uses a low-dose patch for more consistent pain control.  She has a history of anemia, likely related to her gastric bypass surgery, and has recently started taking an iron supplement with vitamin C to improve absorption. She suspects her iron levels may still be low.    Health Maintenance Summary Reviewed and updated, unless pt declines services.  Tobacco History Reviewed. Non-smoker Alcohol: No concerns, no excessive use Exercise Habits: Some activity, rec at least 30 mins 5 times a week STD concerns: none Drug Use: None Lumps or breast concerns: no  Hepatitis C Tdap Hep B Mammogram - [facility] Colonoscopy COVID booster    Health Maintenance  Topic Date Due   Hepatitis C Screening  Never done   DTaP/Tdap/Td (1 - Tdap) Never done   Hepatitis B Vaccines 19-59 Average Risk (1 of 3 - 19+ 3-dose series) Never done   HPV VACCINES (1 - Risk 3-dose SCDM series) Never done   Mammogram  Never done   Colonoscopy  Never done   COVID-19 Vaccine (4 - 2025-26 season) 05/30/2024   Cervical Cancer Screening (HPV/Pap Cotest)  04/09/2025   Influenza Vaccine  Completed   HIV Screening  Completed   Pneumococcal Vaccine  Aged Out   Meningococcal B Vaccine  Aged Out    Immunization History  Administered Date(s) Administered   Influenza Inj Mdck Quad Pf 08/21/2018, 08/17/2019, 07/12/2022   Influenza, Seasonal, Injecte, Preservative Fre 06/08/2024   Influenza,inj,Quad PF,6+ Mos 07/19/2014, 07/02/2015, 08/05/2016   Influenza-Unspecified 08/04/2021   Moderna Covid-19 Fall Seasonal Vaccine 60yrs & older 07/12/2022   PFIZER(Purple Top)SARS-COV-2 Vaccination  12/20/2019, 01/10/2020   Patient Active Problem List   Diagnosis Date Noted   H/O gastric bypass 12/17/2012    Priority: High   Primary hypertension 06/08/2024    Priority: Medium    DVT (deep venous thrombosis) (HCC) 12/14/2020    Priority: Medium    Major depressive disorder, recurrent episode, in full remission 10/10/2018    Priority: Medium    Severe obesity (BMI >= 40) (HCC)     Priority: Medium    Herpes simplex type 1 infection 10/21/2019    Priority: Low   Generalized anxiety disorder 10/10/2018    Priority: Low   Allergic rhinitis 12/17/2012    Priority: Low   PCOS (polycystic ovarian syndrome)     Priority: Low   Vitamin B12 deficiency     Priority: Low   RBBB 06/08/2024   Binge eating disorder 08/17/2019   Microcytic anemia 11/18/2011    Past Medical History:  Diagnosis Date   Bariatric surgery status complicating pregnancy, childbirth, or the puerperium, antepartum condition or complication 09/29/2006   HTN (hypertension)    Iron deficiency anemia    due to gastric bypass.    Mild asthma 01/10/2014   Morbid obesity with BMI of 40.0-44.9, adult (HCC)    PCOS (polycystic ovarian syndrome)    Unspecified vitamin D  deficiency    Vitamin B12 deficiency    due to gastric bypass.     Past Surgical History:  Procedure Laterality Date   APPENDECTOMY  2005   GASTRIC BYPASS  2008   Rou-en-Y   gynecologic surgery  2010   HSG   INTRAUTERINE DEVICE INSERTION     NASAL SEPTUM SURGERY  1996   WISDOM TOOTH EXTRACTION      Family History  Problem Relation Age of Onset   Hypertension Mother    Diabetes Mother    Heart disease Mother    Cancer Maternal Grandfather        brain   Heart attack Paternal Grandfather    Anesthesia problems Neg Hx     Social History   Social History Narrative   Widowed 2016- husband had pulmonary disease   2 kids.     Past Medical History, Surgical History, Social History, Family History, Problem List, Medications, and  Allergies have been reviewed and updated if relevant.  Review of Systems: Pertinent positives are listed above.  Otherwise, a full 14 point review of systems has been done in full and it is negative except where it is noted positive.  Objective:   BP 120/86   Pulse 93   Temp (!) 97.1 F (36.2 C) (Temporal)   Ht 5' 7 (1.702 m)   Wt (!) 307 lb 8 oz (139.5 kg)   SpO2 99%   BMI 48.16 kg/m  Ideal Body Weight: Weight in (lb) to have BMI = 25: 159.3 No results found.    06/08/2024   12:19 PM 12/19/2022   11:43 AM  Depression screen PHQ 2/9  Decreased Interest 0 0  Down, Depressed, Hopeless 0 0  PHQ - 2 Score 0 0  Altered sleeping 0 1  Tired, decreased energy  2 1  Change in appetite 1 1  Feeling bad or failure about yourself  1 0  Trouble concentrating 0 1  Moving slowly or fidgety/restless 0 0  Suicidal thoughts 0 0  PHQ-9 Score 4  4   Difficult doing work/chores Not difficult at all Not difficult at all     Data saved with a previous flowsheet row definition     GEN: well developed, well nourished, no acute distress Eyes: conjunctiva and lids normal, PERRLA, EOMI ENT: TM clear, nares clear, oral exam WNL Neck: supple, no lymphadenopathy, no thyromegaly, no JVD Pulm: clear to auscultation and percussion, respiratory effort normal CV: regular rate and rhythm, S1-S2, no murmur, rub or gallop, no bruits Chest: no scars, masses, no lumps BREAST: breast exam declined GI: soft, non-tender; no hepatosplenomegaly, masses; active bowel sounds all quadrants GU: GU exam declined Lymph: no cervical, axillary or inguinal adenopathy MSK: gait normal, muscle tone and strength WNL, no joint swelling, effusions, discoloration, crepitus  SKIN: clear, good turgor, color WNL, no rashes, lesions, or ulcerations Neuro: normal mental status, normal strength, sensation, and motion Psych: alert; oriented to person, place and time, normally interactive and not anxious or depressed in  appearance.   All labs reviewed with patient. Results for orders placed or performed in visit on 06/08/24  CBC with Differential/Platelet   Collection Time: 06/08/24  1:03 PM  Result Value Ref Range   WBC 7.9 4.0 - 10.5 K/uL   RBC 4.41 3.87 - 5.11 Mil/uL   Hemoglobin 10.3 (L) 12.0 - 15.0 g/dL   HCT 66.5 (L) 63.9 - 53.9 %   MCV 75.7 (L) 78.0 - 100.0 fl   MCHC 31.0 30.0 - 36.0 g/dL   RDW 81.5 (H) 88.4 - 84.4 %   Platelets 330.0 150.0 - 400.0 K/uL   Neutrophils Relative % 68.9 43.0 - 77.0 %   Lymphocytes Relative 22.3 12.0 - 46.0 %   Monocytes Relative 5.8 3.0 - 12.0 %   Eosinophils Relative 2.3 0.0 - 5.0 %   Basophils Relative 0.7 0.0 - 3.0 %   Neutro Abs 5.5 1.4 - 7.7 K/uL   Lymphs Abs 1.8 0.7 - 4.0 K/uL   Monocytes Absolute 0.5 0.1 - 1.0 K/uL   Eosinophils Absolute 0.2 0.0 - 0.7 K/uL   Basophils Absolute 0.1 0.0 - 0.1 K/uL  Basic metabolic panel with GFR   Collection Time: 06/08/24  1:03 PM  Result Value Ref Range   Sodium 137 135 - 145 mEq/L   Potassium 4.1 3.5 - 5.1 mEq/L   Chloride 99 96 - 112 mEq/L   CO2 31 19 - 32 mEq/L   Glucose, Bld 87 70 - 99 mg/dL   BUN 8 6 - 23 mg/dL   Creatinine, Ser 9.32 0.40 - 1.20 mg/dL   GFR 894.63 >39.99 mL/min   Calcium  8.9 8.4 - 10.5 mg/dL  TSH   Collection Time: 06/08/24  1:03 PM  Result Value Ref Range   TSH 0.75 0.35 - 5.50 uIU/mL   No results found.  Assessment and Plan:     ICD-10-CM   1. Healthcare maintenance  Z00.00     2. Severe obesity (BMI >= 40) (HCC)  E66.01     3. H/O gastric bypass  Z98.84     4. Primary hypertension  I10     5. Need for hepatitis C screening test  Z11.59 Hepatitis C antibody    6. Encounter for long-term current use of medication  Z79.899 VITAMIN D  25  Hydroxy (Vit-D Deficiency, Fractures)    Vitamin B12    T4, free    T3, free    Hepatic function panel    7. History of Roux-en-Y gastric bypass  Z98.84 VITAMIN D  25 Hydroxy (Vit-D Deficiency, Fractures)    Vitamin B12    T4, free     T3, free    8. Screening, lipid  Z13.220 Lipid panel    9. Other iron deficiency anemia  D50.8 IBC + Ferritin    CBC with Differential/Platelet    10. Screening for diabetes mellitus  Z13.1 Hemoglobin A1c    11. Colon cancer screening  Z12.11 Ambulatory referral to Gastroenterology     Assessment & Plan Adult Wellness Visit Routine wellness visit. Blood pressure controlled. Weight slightly fluctuated. Discussed colon cancer screening and mammogram scheduling. - Administered tetanus and hepatitis B vaccines. - Resent referral for colon cancer screening and provided contact number for GI. - Provided contact numbers for mammography centers for scheduling. - Ordered A1c, liver panel, iron levels, cholesterol, thyroid , B12, vitamin D , and hepatitis C tests.  Morbid obesity due to excess calories, status post bariatric surgery Morbid obesity post Roux-en-Y gastric bypass. Discussed potential impact on iron absorption and anemia. - Continue current management and monitor weight.  Essential hypertension Hypertension well-controlled with current medication. - Continue lisinopril  20 mg oral daily.  Iron deficiency anemia Likely related to Roux-en-Y gastric bypass affecting iron absorption. Recently started on iron supplement with vitamin C. - Continue iron supplement with vitamin C. - Ordered iron levels and ferritin to assess current status.  Allergic rhinitis with episodic bronchospasm Allergic rhinitis with episodes of bronchospasm, exacerbated by recent cold. Wheezing and hoarseness improving. Ventolin  inhaler used as needed. - Refilled Ventolin  inhaler prescription and sent to [facility] [place].  Chronic pain management with buprenorphine -she sees pain management. Chronic pain managed with buprenorphine patch for consistent pain control. - Continue buprenorphine patch for pain management.  Health Maintenance Exam: The patient's preventative maintenance and recommended screening tests  for an annual wellness exam were reviewed in full today. Brought up to date unless services declined.  Counselled on the importance of diet, exercise, and its role in overall health and mortality. The patient's FH and SH was reviewed, including their home life, tobacco status, and drug and alcohol status.  Follow-up in 1 year for physical exam or additional follow-up below.  Disposition: Return in about 1 year (around 08/10/2025).  Future Appointments  Date Time Provider Department Center  01/02/2025  4:00 PM Mozingo, Regina Nattalie, NP CP-CP None    Meds ordered this encounter  Medications   albuterol  (VENTOLIN  HFA) 108 (90 Base) MCG/ACT inhaler    Sig: Inhale 2 puffs into the lungs every 4 (four) hours as needed for wheezing or shortness of breath.    Dispense:  1 each    Refill:  3   lisinopril  (ZESTRIL ) 20 MG tablet    Sig: Take 1 tablet (20 mg total) by mouth daily.    Dispense:  90 tablet    Refill:  3   Medications Discontinued During This Encounter  Medication Reason   albuterol  (PROVENTIL  HFA;VENTOLIN  HFA) 108 (90 BASE) MCG/ACT inhaler Reorder   lisinopril  (ZESTRIL ) 20 MG tablet Reorder   Orders Placed This Encounter  Procedures   Hepatitis C antibody   CBC with Differential/Platelet   Ambulatory referral to Gastroenterology    Signed,  Jacques T. Erle Guster, MD   Allergies as of 08/10/2024       Reactions  Vancomycin Other (See Comments)   Patient states that when this medication is given intravenously, it causes her kidney's to shut down.   Neosporin [neomycin-polymyxin-gramicidin] Other (See Comments)   Patient states that this medication causes a poison ivy rash like reaction.        Medication List        Accurate as of August 10, 2024 10:20 AM. If you have any questions, ask your nurse or doctor.          albuterol  108 (90 Base) MCG/ACT inhaler Commonly known as: VENTOLIN  HFA Inhale 2 puffs into the lungs every 4 (four) hours as needed  for wheezing or shortness of breath.   buprenorphine 7.5 MCG/HR Commonly known as: BUTRANS Place 1 patch onto the skin once a week.   cetirizine 10 MG tablet Commonly known as: ZYRTEC Take 10 mg by mouth daily.   clobetasol ointment 0.05 % Commonly known as: TEMOVATE Apply topically 2 (two) times daily as needed.   fluticasone  50 MCG/ACT nasal spray Commonly known as: FLONASE  Place 1 spray into both nostrils daily as needed for allergies.   gabapentin 300 MG capsule Commonly known as: NEURONTIN TAKE 1 CAPSULE BY MOUTH THREE TIMES A DAY AS DIRECTED (INCREASE IN FREQUENCY)   ibuprofen  200 MG tablet Commonly known as: ADVIL  Take 400-800 mg by mouth every 8 (eight) hours as needed for headache or moderate pain.   levonorgestrel  20 MCG/24HR IUD Commonly known as: MIRENA 1 each by Intrauterine route once.   lisdexamfetamine  40 MG capsule Commonly known as: Vyvanse  Take 1 capsule (40 mg total) by mouth every morning.   lisdexamfetamine  40 MG capsule Commonly known as: Vyvanse  Take 1 capsule (40 mg total) by mouth every morning.   lisdexamfetamine  40 MG capsule Commonly known as: Vyvanse  Take 1 capsule (40 mg total) by mouth every morning. Start taking on: August 29, 2024   lisinopril  20 MG tablet Commonly known as: ZESTRIL  Take 1 tablet (20 mg total) by mouth daily.   multivitamin with minerals tablet Take 1 tablet by mouth daily.   mupirocin  ointment 2 % Commonly known as: BACTROBAN  Place 1 Application into the nose as needed (for wound care).   PARoxetine  30 MG tablet Commonly known as: PAXIL  Take 1 tablet (30 mg total) by mouth daily at 10 pm.   PreviDent 5000 Dry Mouth 1.1 % Gel dental gel Generic drug: sodium fluoride Use Prevident 5000 once daily instead of the regular toothpaste (preferably at bed time). Apply a pea-sized amount onto a soft-toothbrush. Brush teeth for 2 minutes. Spit out thoroughly the toothpaste but do not rinse, eat or drink for 30 min.  DO NOT SWALLOW IT.   promethazine  25 MG tablet Commonly known as: PHENERGAN  TAKE 1 TABLET BY MOUTH EVERY 6 HOURS AS NEEDED FOR NAUSEA AND VOMITING   traZODone  50 MG tablet Commonly known as: DESYREL  TAKE 1 TO 2 TABLETS BY MOUTH EVERYDAY AT BEDTIME   valACYclovir  1000 MG tablet Commonly known as: VALTREX  TAKE 2 TABLETS (2,000 MG TOTAL) BY MOUTH TWICE A DAY        "

## 2024-08-10 ENCOUNTER — Ambulatory Visit (INDEPENDENT_AMBULATORY_CARE_PROVIDER_SITE_OTHER): Admitting: Family Medicine

## 2024-08-10 ENCOUNTER — Encounter: Payer: Self-pay | Admitting: Family Medicine

## 2024-08-10 VITALS — BP 120/86 | HR 93 | Temp 97.1°F | Ht 67.0 in | Wt 307.5 lb

## 2024-08-10 DIAGNOSIS — Z13220 Encounter for screening for lipoid disorders: Secondary | ICD-10-CM

## 2024-08-10 DIAGNOSIS — Z1159 Encounter for screening for other viral diseases: Secondary | ICD-10-CM

## 2024-08-10 DIAGNOSIS — I10 Essential (primary) hypertension: Secondary | ICD-10-CM | POA: Diagnosis not present

## 2024-08-10 DIAGNOSIS — Z23 Encounter for immunization: Secondary | ICD-10-CM | POA: Diagnosis not present

## 2024-08-10 DIAGNOSIS — D508 Other iron deficiency anemias: Secondary | ICD-10-CM | POA: Diagnosis not present

## 2024-08-10 DIAGNOSIS — Z79899 Other long term (current) drug therapy: Secondary | ICD-10-CM

## 2024-08-10 DIAGNOSIS — Z0000 Encounter for general adult medical examination without abnormal findings: Secondary | ICD-10-CM | POA: Diagnosis not present

## 2024-08-10 DIAGNOSIS — Z131 Encounter for screening for diabetes mellitus: Secondary | ICD-10-CM | POA: Diagnosis not present

## 2024-08-10 DIAGNOSIS — Z9884 Bariatric surgery status: Secondary | ICD-10-CM

## 2024-08-10 DIAGNOSIS — Z1211 Encounter for screening for malignant neoplasm of colon: Secondary | ICD-10-CM

## 2024-08-10 LAB — CBC WITH DIFFERENTIAL/PLATELET
Basophils Absolute: 0 K/uL (ref 0.0–0.1)
Basophils Relative: 0.6 % (ref 0.0–3.0)
Eosinophils Absolute: 0.3 K/uL (ref 0.0–0.7)
Eosinophils Relative: 4.2 % (ref 0.0–5.0)
HCT: 32.2 % — ABNORMAL LOW (ref 36.0–46.0)
Hemoglobin: 10.2 g/dL — ABNORMAL LOW (ref 12.0–15.0)
Lymphocytes Relative: 19.5 % (ref 12.0–46.0)
Lymphs Abs: 1.4 K/uL (ref 0.7–4.0)
MCHC: 31.6 g/dL (ref 30.0–36.0)
MCV: 74.7 fl — ABNORMAL LOW (ref 78.0–100.0)
Monocytes Absolute: 0.4 K/uL (ref 0.1–1.0)
Monocytes Relative: 5.3 % (ref 3.0–12.0)
Neutro Abs: 5 K/uL (ref 1.4–7.7)
Neutrophils Relative %: 70.4 % (ref 43.0–77.0)
Platelets: 303 K/uL (ref 150.0–400.0)
RBC: 4.31 Mil/uL (ref 3.87–5.11)
RDW: 16.5 % — ABNORMAL HIGH (ref 11.5–15.5)
WBC: 7.1 K/uL (ref 4.0–10.5)

## 2024-08-10 LAB — HEPATIC FUNCTION PANEL
ALT: 7 U/L (ref 0–35)
AST: 12 U/L (ref 0–37)
Albumin: 4.1 g/dL (ref 3.5–5.2)
Alkaline Phosphatase: 81 U/L (ref 39–117)
Bilirubin, Direct: 0.1 mg/dL (ref 0.0–0.3)
Total Bilirubin: 0.4 mg/dL (ref 0.2–1.2)
Total Protein: 6.7 g/dL (ref 6.0–8.3)

## 2024-08-10 LAB — IBC + FERRITIN
Ferritin: 5.4 ng/mL — ABNORMAL LOW (ref 10.0–291.0)
Iron: 25 ug/dL — ABNORMAL LOW (ref 42–145)
Saturation Ratios: 5.7 % — ABNORMAL LOW (ref 20.0–50.0)
TIBC: 436.8 ug/dL (ref 250.0–450.0)
Transferrin: 312 mg/dL (ref 212.0–360.0)

## 2024-08-10 LAB — HEMOGLOBIN A1C: Hgb A1c MFr Bld: 6 % (ref 4.6–6.5)

## 2024-08-10 LAB — VITAMIN B12: Vitamin B-12: 155 pg/mL — ABNORMAL LOW (ref 211–911)

## 2024-08-10 LAB — T4, FREE: Free T4: 0.73 ng/dL (ref 0.60–1.60)

## 2024-08-10 LAB — LIPID PANEL
Cholesterol: 161 mg/dL (ref 0–200)
HDL: 38 mg/dL — ABNORMAL LOW (ref >39.00)
LDL Cholesterol: 109 mg/dL — ABNORMAL HIGH (ref 0–99)
NonHDL: 122.7
Total CHOL/HDL Ratio: 4
Triglycerides: 70 mg/dL (ref 0.0–149.0)
VLDL: 14 mg/dL (ref 0.0–40.0)

## 2024-08-10 LAB — T3, FREE: T3, Free: 3.3 pg/mL (ref 2.3–4.2)

## 2024-08-10 LAB — VITAMIN D 25 HYDROXY (VIT D DEFICIENCY, FRACTURES): VITD: 12.49 ng/mL — ABNORMAL LOW (ref 30.00–100.00)

## 2024-08-10 MED ORDER — ALBUTEROL SULFATE HFA 108 (90 BASE) MCG/ACT IN AERS: 2.0000 | INHALATION_SPRAY | RESPIRATORY_TRACT | 3 refills | Status: AC | PRN

## 2024-08-10 MED ORDER — LISINOPRIL 20 MG PO TABS: 20.0000 mg | ORAL_TABLET | Freq: Every day | ORAL | 3 refills | Status: AC

## 2024-08-10 NOTE — Patient Instructions (Addendum)
 [facility] [phone]   You do not need a referral to make a mammogram appointment, and you may call to make her own mammogram appointment directly around your schedule.  [phone] [address] [place], [place] [address]  [facility] ([facility]) [address] [place], [place] [address] Phone: [phone] Toll Free: [phone]  MAMMOGRAPHY IN [place]:  [facility] or [facility]) [phone] Located on the campus of [facility])  [facility] [place] [facility] Location) [address]  [place], [place] [address]

## 2024-08-10 NOTE — Addendum Note (Signed)
 Addended by: WENDELL ARLAND RAMAN on: 08/10/2024 10:33 AM   Modules accepted: Orders

## 2024-08-11 LAB — HEPATITIS C ANTIBODY: Hepatitis C Ab: NONREACTIVE

## 2024-08-18 ENCOUNTER — Encounter: Payer: Self-pay | Admitting: Family Medicine

## 2024-08-18 ENCOUNTER — Ambulatory Visit: Admitting: Family Medicine

## 2024-08-18 VITALS — BP 110/72 | HR 92 | Temp 98.3°F | Ht 67.0 in | Wt 307.2 lb

## 2024-08-18 DIAGNOSIS — I872 Venous insufficiency (chronic) (peripheral): Secondary | ICD-10-CM

## 2024-08-18 DIAGNOSIS — L03115 Cellulitis of right lower limb: Secondary | ICD-10-CM

## 2024-08-18 MED ORDER — DOXYCYCLINE HYCLATE 100 MG PO TABS: 100.0000 mg | ORAL_TABLET | Freq: Two times a day (BID) | ORAL | 0 refills | Status: AC

## 2024-08-18 NOTE — Progress Notes (Signed)
 Patient ID: Meredith Castillo, female    DOB: [birth year], 43 y.o.   MRN: [id number]  This visit was conducted in person.  BP 110/72   Pulse 92   Temp 98.3 F (36.8 C) (Temporal)   Ht 5' 7 (1.702 m)   Wt (!) 307 lb 4 oz (139.4 kg)   SpO2 100%   BMI 48.12 kg/m    CC:  Chief Complaint  Patient presents with   Sore on RIght Leg    Subjective:   HPI: Meredith Castillo is a 43 y.o. female presenting on 08/18/2024 for Sore on RIght Leg  New onset sore on right leg  In this area she had a small growth for many years... but recently started itching.  Now in last week scratched it... now weeping clear yellowish liquid She has peripheral edema.   No past recurrent cellulitis.  No past MRSA.  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  No recurrent boils.   Recent CPX with PCP 08/10/2024  Wbc 7.1   Has been applying mupirocin .      Relevant past medical, surgical, family and social history reviewed and updated as indicated. Interim medical history since our last visit reviewed. Allergies and medications reviewed and updated. Outpatient Medications Prior to Visit  Medication Sig Dispense Refill   albuterol  (VENTOLIN  HFA) 108 (90 Base) MCG/ACT inhaler Inhale 2 puffs into the lungs every 4 (four) hours as needed for wheezing or shortness of breath. 1 each 3   buprenorphine (BUTRANS) 7.5 MCG/HR Place 1 patch onto the skin once a week.     cetirizine (ZYRTEC) 10 MG tablet Take 10 mg by mouth daily.     clobetasol ointment (TEMOVATE) 0.05 % Apply topically 2 (two) times daily as needed.     fluticasone  (FLONASE ) 50 MCG/ACT nasal spray Place 1 spray into both nostrils daily as needed for allergies.     gabapentin (NEURONTIN) 300 MG capsule TAKE 1 CAPSULE BY MOUTH THREE TIMES A DAY AS DIRECTED (INCREASE IN FREQUENCY)     ibuprofen  (ADVIL ,MOTRIN ) 200 MG tablet Take 400-800 mg by mouth every 8 (eight) hours as needed for headache or moderate pain.      levonorgestrel  (MIRENA) 20 MCG/24HR IUD  1 each by Intrauterine route once.     lisdexamfetamine (VYVANSE ) 40 MG capsule Take 1 capsule (40 mg total) by mouth every morning. 30 capsule 0   [START ON 08/29/2024] lisdexamfetamine (VYVANSE ) 40 MG capsule Take 1 capsule (40 mg total) by mouth every morning. 30 capsule 0   lisdexamfetamine (VYVANSE ) 40 MG capsule Take 1 capsule (40 mg total) by mouth every morning. 30 capsule 0   lisinopril  (ZESTRIL ) 20 MG tablet Take 1 tablet (20 mg total) by mouth daily. 90 tablet 3   Multiple Vitamins-Minerals (MULTIVITAMIN WITH MINERALS) tablet Take 1 tablet by mouth daily.     mupirocin  ointment (BACTROBAN ) 2 % Place 1 Application into the nose as needed (for wound care). 22 g 1   PARoxetine  (PAXIL ) 30 MG tablet Take 1 tablet (30 mg total) by mouth daily at 10 pm. 90 tablet 3   promethazine  (PHENERGAN ) 25 MG tablet TAKE 1 TABLET BY MOUTH EVERY 6 HOURS AS NEEDED FOR NAUSEA AND VOMITING 30 tablet 2   sodium fluoride (PREVIDENT 5000 DRY MOUTH) 1.1 % GEL dental gel Use Prevident 5000 once daily instead of the regular toothpaste (preferably at bed time). Apply a pea-sized amount onto a soft-toothbrush. Brush teeth for 2 minutes. Spit out thoroughly the  toothpaste but do not rinse, eat or drink for 30 min. DO NOT SWALLOW IT.     traZODone  (DESYREL ) 50 MG tablet TAKE 1 TO 2 TABLETS BY MOUTH EVERYDAY AT BEDTIME 180 tablet 3   valACYclovir  (VALTREX ) 1000 MG tablet TAKE 2 TABLETS (2,000 MG TOTAL) BY MOUTH TWICE A DAY 20 tablet 2   No facility-administered medications prior to visit.     Per HPI unless specifically indicated in ROS section below Review of Systems  Constitutional:  Negative for fatigue and fever.  HENT:  Negative for congestion.   Eyes:  Negative for pain.  Respiratory:  Negative for cough and shortness of breath.   Cardiovascular:  Positive for leg swelling. Negative for chest pain and palpitations.  Gastrointestinal:  Negative for abdominal pain.  Genitourinary:  Negative for dysuria and  vaginal bleeding.  Musculoskeletal:  Negative for back pain.  Neurological:  Negative for syncope, light-headedness and headaches.  Psychiatric/Behavioral:  Negative for dysphoric mood.    Objective:  BP 110/72   Pulse 92   Temp 98.3 F (36.8 C) (Temporal)   Ht 5' 7 (1.702 m)   Wt (!) 307 lb 4 oz (139.4 kg)   SpO2 100%   BMI 48.12 kg/m   Wt Readings from Last 3 Encounters:  08/18/24 (!) 307 lb 4 oz (139.4 kg)  08/10/24 (!) 307 lb 8 oz (139.5 kg)  06/29/24 (!) 319 lb 2 oz (144.8 kg)      Physical Exam Constitutional:      General: She is not in acute distress.    Appearance: Normal appearance. She is well-developed. She is not ill-appearing or toxic-appearing.  HENT:     Head: Normocephalic.     Right Ear: Hearing, tympanic membrane, ear canal and external ear normal. Tympanic membrane is not erythematous, retracted or bulging.     Left Ear: Hearing, tympanic membrane, ear canal and external ear normal. Tympanic membrane is not erythematous, retracted or bulging.     Nose: No mucosal edema or rhinorrhea.     Right Sinus: No maxillary sinus tenderness or frontal sinus tenderness.     Left Sinus: No maxillary sinus tenderness or frontal sinus tenderness.     Mouth/Throat:     Pharynx: Uvula midline.  Eyes:     General: Lids are normal. Lids are everted, no foreign bodies appreciated.     Conjunctiva/sclera: Conjunctivae normal.     Pupils: Pupils are equal, round, and reactive to light.  Neck:     Thyroid : No thyroid  mass or thyromegaly.     Vascular: No carotid bruit.     Trachea: Trachea normal.  Cardiovascular:     Rate and Rhythm: Normal rate and regular rhythm.     Pulses: Normal pulses.     Heart sounds: Normal heart sounds, S1 normal and S2 normal. No murmur heard.    No friction rub. No gallop.  Pulmonary:     Effort: Pulmonary effort is normal. No tachypnea or respiratory distress.     Breath sounds: Normal breath sounds. No decreased breath sounds, wheezing,  rhonchi or rales.  Abdominal:     General: Bowel sounds are normal.     Palpations: Abdomen is soft.     Tenderness: There is no abdominal tenderness.  Musculoskeletal:     Cervical back: Normal range of motion and neck supple.  Skin:    General: Skin is warm and dry.     Findings: Rash present.  Comments: See photo  Neurological:     Mental Status: She is alert.  Psychiatric:        Mood and Affect: Mood is not anxious or depressed.        Speech: Speech normal.        Behavior: Behavior normal. Behavior is cooperative.        Thought Content: Thought content normal.        Judgment: Judgment normal.       Results for orders placed or performed in visit on 08/10/24  Hepatitis C antibody   Collection Time: 08/10/24 10:27 AM  Result Value Ref Range   Hepatitis C Ab NON-REACTIVE NON-REACTIVE  VITAMIN D 25 Hydroxy (Vit-D Deficiency, Fractures)   Collection Time: 08/10/24 10:27 AM  Result Value Ref Range   VITD 12.49 (L) 30.00 - 100.00 ng/mL  Vitamin B12   Collection Time: 08/10/24 10:27 AM  Result Value Ref Range   Vitamin B-12 155 (L) 211 - 911 pg/mL  T4, free   Collection Time: 08/10/24 10:27 AM  Result Value Ref Range   Free T4 0.73 0.60 - 1.60 ng/dL  T3, free   Collection Time: 08/10/24 10:27 AM  Result Value Ref Range   T3, Free 3.3 2.3 - 4.2 pg/mL  Lipid panel   Collection Time: 08/10/24 10:27 AM  Result Value Ref Range   Cholesterol 161 0 - 200 mg/dL   Triglycerides 29.9 0.0 - 149.0 mg/dL   HDL 61.99 (L) >60.99 mg/dL   VLDL 85.9 0.0 - 59.9 mg/dL   LDL Cholesterol 890 (H) 0 - 99 mg/dL   Total CHOL/HDL Ratio 4    NonHDL 122.70   IBC + Ferritin   Collection Time: 08/10/24 10:27 AM  Result Value Ref Range   Iron 25 (L) 42 - 145 ug/dL   Transferrin 687.9 787.9 - 360.0 mg/dL   Saturation Ratios 5.7 (L) 20.0 - 50.0 %   Ferritin 5.4 (L) 10.0 - 291.0 ng/mL   TIBC 436.8 250.0 - 450.0 mcg/dL  Hepatic function panel   Collection Time: 08/10/24 10:27 AM   Result Value Ref Range   Total Bilirubin 0.4 0.2 - 1.2 mg/dL   Bilirubin, Direct 0.1 0.0 - 0.3 mg/dL   Alkaline Phosphatase 81 39 - 117 U/L   AST 12 0 - 37 U/L   ALT 7 0 - 35 U/L   Total Protein 6.7 6.0 - 8.3 g/dL   Albumin 4.1 3.5 - 5.2 g/dL  Hemoglobin J8r   Collection Time: 08/10/24 10:27 AM  Result Value Ref Range   Hgb A1c MFr Bld 6.0 4.6 - 6.5 %  CBC with Differential/Platelet   Collection Time: 08/10/24 10:27 AM  Result Value Ref Range   WBC 7.1 4.0 - 10.5 K/uL   RBC 4.31 3.87 - 5.11 Mil/uL   Hemoglobin 10.2 (L) 12.0 - 15.0 g/dL   HCT 67.7 (L) 63.9 - 53.9 %   MCV 74.7 (L) 78.0 - 100.0 fl   MCHC 31.6 30.0 - 36.0 g/dL   RDW 83.4 (H) 88.4 - 84.4 %   Platelets 303.0 150.0 - 400.0 K/uL   Neutrophils Relative % 70.4 43.0 - 77.0 %   Lymphocytes Relative 19.5 12.0 - 46.0 %   Monocytes Relative 5.3 3.0 - 12.0 %   Eosinophils Relative 4.2 0.0 - 5.0 %   Basophils Relative 0.6 0.0 - 3.0 %   Neutro Abs 5.0 1.4 - 7.7 K/uL   Lymphs Abs 1.4 0.7 - 4.0 K/uL   Monocytes  Absolute 0.4 0.1 - 1.0 K/uL   Eosinophils Absolute 0.3 0.0 - 0.7 K/uL   Basophils Absolute 0.0 0.0 - 0.1 K/uL    Assessment and Plan  Cellulitis of right lower leg Assessment & Plan: Acute, concern for bacterial cellulitis. Will treat with doxycycline  100 mg p.o. 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 She will continue putting topical mupirocin  ointment and wash with antibiotic soap. It looks like there is a small local allergic contact dermatitis from the bandage.  Encouraged her to use paper tape and absorbent gauze for dressing.  Reviewed return and ER precautions.  She will contact us  if the redness is spreading, fever on antibiotic or not tolerating antibiotic.   Venous insufficiency Assessment & Plan: Chronic, once infection improving encouraged her to wear  compression hose to help with peripheral swelling.   Other orders -     Doxycycline  Hyclate; Take 1 tablet (100 mg total) by mouth 2 (two) times daily.  Dispense: 20 tablet; Refill: 0    No follow-ups on file.   Greig Ring, MD

## 2024-08-18 NOTE — Assessment & Plan Note (Signed)
 Chronic, once infection improving encouraged her to wear compression hose to help with peripheral swelling.

## 2024-08-18 NOTE — Assessment & Plan Note (Signed)
 Acute, concern for bacterial cellulitis. Will treat with doxycycline  100 mg p.o. 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 She will continue putting topical mupirocin  ointment and wash with antibiotic soap. It looks like there is a small local allergic contact dermatitis from the bandage.  Encouraged her to use paper tape and absorbent gauze for dressing.  Reviewed return and ER precautions.  She will contact us  if the redness is spreading, fever on antibiotic or not tolerating antibiotic.

## 2024-08-19 ENCOUNTER — Ambulatory Visit: Payer: Self-pay | Admitting: Family Medicine

## 2024-09-13 ENCOUNTER — Ambulatory Visit (INDEPENDENT_AMBULATORY_CARE_PROVIDER_SITE_OTHER)

## 2024-09-13 DIAGNOSIS — Z23 Encounter for immunization: Secondary | ICD-10-CM

## 2024-09-13 NOTE — Progress Notes (Signed)
 Per orders of Dr. Jacques Copland, injection of Hepatitis B Vaccine given by Nellie Hummer in Left deltoid.  Patient tolerated injection well.

## 2024-09-18 ENCOUNTER — Other Ambulatory Visit: Payer: Self-pay | Admitting: Family Medicine

## 2024-10-13 ENCOUNTER — Telehealth: Payer: Self-pay | Admitting: Adult Health

## 2024-10-13 ENCOUNTER — Other Ambulatory Visit: Payer: Self-pay | Admitting: Adult Health

## 2024-10-13 DIAGNOSIS — F900 Attention-deficit hyperactivity disorder, predominantly inattentive type: Secondary | ICD-10-CM

## 2024-10-13 MED ORDER — LISDEXAMFETAMINE DIMESYLATE 40 MG PO CAPS: 40.0000 mg | ORAL_CAPSULE | ORAL | 0 refills | Status: AC

## 2024-10-13 NOTE — Telephone Encounter (Signed)
 Next appt is 01/02/25. Abigal is requesting a refill on Vyvanse  40 mg called to:  [id number] - [place], [place] - [address]   Phone: [phone]  Fax: [phone]

## 2025-01-02 ENCOUNTER — Telehealth: Admitting: Adult Health

## 2025-08-09 ENCOUNTER — Other Ambulatory Visit

## 2025-08-16 ENCOUNTER — Encounter: Admitting: Family Medicine
